# Patient Record
Sex: Male | Born: 1980 | Race: Black or African American | Hispanic: No | Marital: Single | Smoking: Former smoker
Health system: Southern US, Community
[De-identification: ages and names within clinical notes are randomized; demographics above are authoritative.]

## PROBLEM LIST (undated history)

## (undated) DIAGNOSIS — R4689 Other symptoms and signs involving appearance and behavior: Secondary | ICD-10-CM

## (undated) DIAGNOSIS — M109 Gout, unspecified: Secondary | ICD-10-CM

## (undated) DIAGNOSIS — Z87891 Personal history of nicotine dependence: Secondary | ICD-10-CM

## (undated) DIAGNOSIS — B009 Herpesviral infection, unspecified: Secondary | ICD-10-CM

## (undated) DIAGNOSIS — E119 Type 2 diabetes mellitus without complications: Secondary | ICD-10-CM

## (undated) DIAGNOSIS — I10 Essential (primary) hypertension: Secondary | ICD-10-CM

## (undated) HISTORY — DX: Herpesviral infection, unspecified: B00.9

---

## 2012-10-24 ENCOUNTER — Emergency Department: Payer: Self-pay | Admitting: Emergency Medicine

## 2013-02-21 ENCOUNTER — Emergency Department: Payer: Self-pay | Admitting: Emergency Medicine

## 2013-02-21 LAB — COMPREHENSIVE METABOLIC PANEL
Albumin: 3.9 g/dL (ref 3.4–5.0)
Anion Gap: 2 — ABNORMAL LOW (ref 7–16)
Bilirubin,Total: 0.3 mg/dL (ref 0.2–1.0)
Co2: 28 mmol/L (ref 21–32)
EGFR (Non-African Amer.): >60
Glucose: 135 mg/dL — ABNORMAL HIGH (ref 65–99)
Osmolality: 275 (ref 275–301)
SGPT (ALT): 61 U/L (ref 12–78)
Sodium: 137 mmol/L (ref 136–145)
Total Protein: 7.9 g/dL (ref 6.4–8.2)

## 2013-02-21 LAB — CBC WITH DIFFERENTIAL/PLATELET
Basophil #: 0 10*3/uL (ref 0.0–0.1)
Basophil %: 0.8 %
Eosinophil #: 0.1 10*3/uL (ref 0.0–0.7)
Eosinophil %: 1.1 %
HCT: 41.5 % (ref 40.0–52.0)
Lymphocyte #: 1.5 10*3/uL (ref 1.0–3.6)
Lymphocyte %: 30.6 %
MCH: 29.2 pg (ref 26.0–34.0)
MCV: 89 fL (ref 80–100)
Monocyte #: 0.9 x10 3/mm (ref 0.2–1.0)
Neutrophil #: 2.4 10*3/uL (ref 1.4–6.5)
Neutrophil %: 50 %
RBC: 4.65 10*6/uL (ref 4.40–5.90)
RDW: 12.5 % (ref 11.5–14.5)
WBC: 4.9 10*3/uL (ref 3.8–10.6)

## 2013-11-19 ENCOUNTER — Emergency Department: Payer: Self-pay | Admitting: Emergency Medicine

## 2013-11-22 LAB — BETA STREP CULTURE(ARMC)

## 2013-12-28 ENCOUNTER — Ambulatory Visit: Payer: Self-pay | Admitting: Family Medicine

## 2015-11-29 ENCOUNTER — Emergency Department
Admission: EM | Admit: 2015-11-29 | Discharge: 2015-11-29 | Disposition: A | Discharge status: Home / Self Care | Payer: Self-pay | Attending: Emergency Medicine | Admitting: Emergency Medicine

## 2015-11-29 DIAGNOSIS — A6002 Herpesviral infection of other male genital organs: Secondary | ICD-10-CM | POA: Insufficient documentation

## 2015-11-29 DIAGNOSIS — A6000 Herpesviral infection of urogenital system, unspecified: Secondary | ICD-10-CM

## 2015-11-29 NOTE — ED Notes (Signed)
Pt has small area where skin is torn on penis. No bleeding. Pt states area itches and burns. Pt denies applying anything to the area.

## 2015-11-29 NOTE — ED Provider Notes (Signed)
[facility] Emergency Department Provider Note    First MD Initiated Contact with Patient 11/29/15 [phone]     (approximate)  I have reviewed the triage vital signs and the nursing notes.   HISTORY  Chief Complaint Wound Infection   HPI Mark Devra Doppotten Jr. is a 35 y.o. male presents with a "infection to the tip of his penis for which she was seen at the health Department last week and was told that it might be herpes. Patient states blood test was sent to check for herpes however he has not received any correspondence mouth Department. Patient now presents emergency Department concerned about the etiology of the infection. Patient states that the area of concern started as multiple grouped blisters that then ruptured with a resultant painful sore. Patient does admit to unprotected sex. His partner bedside stated that they were both recently treated for chlamydia.   Past medical history None There are no active problems to display for this patient.  Past surgical history None  Prior to Admission medications   Not on File    Allergies No known drug allergies No family history on file.  Social History Social History  Substance Use Topics  . Smoking status: Not on file  . Smokeless tobacco: Not on file  . Alcohol use Not on file    Review of Systems Constitutional: No fever/chills Eyes: No visual changes. ENT: No sore throat. Cardiovascular: Denies chest pain. Respiratory: Denies shortness of breath. Gastrointestinal: No abdominal pain.  No nausea, no vomiting.  No diarrhea.  No constipation. Genitourinary: Negative for dysuria.Positive for sore on penis Musculoskeletal: Negative for back pain. Skin: Negative for rash. Neurological: Negative for headaches, focal weakness or numbness.  10-point ROS otherwise negative.  ____________________________________________   PHYSICAL EXAM:  VITAL SIGNS: ED Triage Vitals  Enc Vitals Group     BP  11/29/15 0119 (!) 214/107     Pulse Rate 11/29/15 0119 81     Resp 11/29/15 0119 18     Temp 11/29/15 0119 98.3 F (36.8 C)     Temp Source 11/29/15 0119 Oral     SpO2 11/29/15 0119 99 %     Weight 11/29/15 0119 220 lb (99.8 kg)     Height 11/29/15 0119 5\' 6"  (1.676 m)     Head Circumference --      Peak Flow --      Pain Score 11/29/15 0120 3     Pain Loc --      Pain Edu? --      Excl. in GC? --     Constitutional: Alert and oriented. Well appearing and in no acute distress. Eyes: Conjunctivae are normal. PERRL. EOMI. Head: Atraumatic.a. Mouth/Throat: Mucous membranes are moist.  Oropharynx non-erythematous. Neck: No stridor.  No meningeal signs.  Cardiovascular: Normal rate, regular rhythm. Good peripheral circulation. Grossly normal heart sounds. Respiratory: Normal respiratory effort.  No retractions. Lungs CTAB. Gastrointestinal: Soft and nontender. No distention.  Genitourinary: Shallow-based ulcer with rolled edges noted base of the glans penis Musculoskeletal: No lower extremity tenderness nor edema. No gross deformities of extremities. Neurologic:  Normal speech and language. No gross focal neurologic deficits are appreciated.  Skin:  Skin is warm, dry and intact. No rash noted.    Procedures     INITIAL IMPRESSION / ASSESSMENT AND PLAN / ED COURSE  Pertinent labs & imaging results that were available during my care of the patient were reviewed by me and considered in my medical decision  making (see chart for details).  History of physical exam concern for possible genital herpes. I informed the patient to contact the health Department to get the results of the blood test that was performed.   Clinical Course    ____________________________________________  FINAL CLINICAL IMPRESSION(S) / ED DIAGNOSES  Final diagnoses:  Genital herpes     MEDICATIONS GIVEN DURING THIS VISIT:  Medications - No data to display   NEW OUTPATIENT MEDICATIONS STARTED  DURING THIS VISIT:  New Prescriptions   No medications on file    Modified Medications   No medications on file    Discontinued Medications   No medications on file     Note:  This document was prepared using Dragon voice recognition software and may include unintentional dictation errors.    Darci Current, MD 11/29/15 2239

## 2015-11-29 NOTE — ED Triage Notes (Signed)
Pt states he has infection to tip of penis went to health dept last week and checked for herpes but did not get results.

## 2017-01-03 ENCOUNTER — Encounter (HOSPITAL_COMMUNITY): Admission: EM | Disposition: A | Discharge status: Rehabilitation Facility | Payer: Self-pay | Source: Home / Self Care

## 2017-01-03 ENCOUNTER — Inpatient Hospital Stay (HOSPITAL_COMMUNITY): Payer: Self-pay

## 2017-01-03 ENCOUNTER — Emergency Department (HOSPITAL_COMMUNITY): Payer: Self-pay

## 2017-01-03 ENCOUNTER — Inpatient Hospital Stay (HOSPITAL_COMMUNITY)
Admission: EM | Admit: 2017-01-03 | Discharge: 2017-01-29 | DRG: 957 | Disposition: A | Discharge status: Rehabilitation Facility | Payer: Self-pay | Attending: General Surgery | Admitting: General Surgery

## 2017-01-03 ENCOUNTER — Emergency Department (HOSPITAL_COMMUNITY): Payer: Self-pay | Admitting: Certified Registered"

## 2017-01-03 DIAGNOSIS — Z9119 Patient's noncompliance with other medical treatment and regimen: Secondary | ICD-10-CM

## 2017-01-03 DIAGNOSIS — S0633AA Contusion and laceration of cerebrum, unspecified, with loss of consciousness status unknown, initial encounter: Secondary | ICD-10-CM

## 2017-01-03 DIAGNOSIS — Y92410 Unspecified street and highway as the place of occurrence of the external cause: Secondary | ICD-10-CM

## 2017-01-03 DIAGNOSIS — J9811 Atelectasis: Secondary | ICD-10-CM | POA: Diagnosis present

## 2017-01-03 DIAGNOSIS — S12100A Unspecified displaced fracture of second cervical vertebra, initial encounter for closed fracture: Secondary | ICD-10-CM

## 2017-01-03 DIAGNOSIS — R509 Fever, unspecified: Secondary | ICD-10-CM

## 2017-01-03 DIAGNOSIS — R0902 Hypoxemia: Secondary | ICD-10-CM

## 2017-01-03 DIAGNOSIS — Z23 Encounter for immunization: Secondary | ICD-10-CM

## 2017-01-03 DIAGNOSIS — S52571A Other intraarticular fracture of lower end of right radius, initial encounter for closed fracture: Secondary | ICD-10-CM | POA: Diagnosis present

## 2017-01-03 DIAGNOSIS — S12101A Unspecified nondisplaced fracture of second cervical vertebra, initial encounter for closed fracture: Secondary | ICD-10-CM | POA: Diagnosis present

## 2017-01-03 DIAGNOSIS — J9690 Respiratory failure, unspecified, unspecified whether with hypoxia or hypercapnia: Secondary | ICD-10-CM

## 2017-01-03 DIAGNOSIS — E1165 Type 2 diabetes mellitus with hyperglycemia: Secondary | ICD-10-CM | POA: Diagnosis present

## 2017-01-03 DIAGNOSIS — E870 Hyperosmolality and hypernatremia: Secondary | ICD-10-CM | POA: Diagnosis not present

## 2017-01-03 DIAGNOSIS — S82261C Displaced segmental fracture of shaft of right tibia, initial encounter for open fracture type IIIA, IIIB, or IIIC: Secondary | ICD-10-CM

## 2017-01-03 DIAGNOSIS — E872 Acidosis: Secondary | ICD-10-CM | POA: Diagnosis present

## 2017-01-03 DIAGNOSIS — R402112 Coma scale, eyes open, never, at arrival to emergency department: Secondary | ICD-10-CM | POA: Diagnosis present

## 2017-01-03 DIAGNOSIS — D649 Anemia, unspecified: Secondary | ICD-10-CM

## 2017-01-03 DIAGNOSIS — S82891C Other fracture of right lower leg, initial encounter for open fracture type IIIA, IIIB, or IIIC: Secondary | ICD-10-CM

## 2017-01-03 DIAGNOSIS — R402312 Coma scale, best motor response, none, at arrival to emergency department: Secondary | ICD-10-CM | POA: Diagnosis present

## 2017-01-03 DIAGNOSIS — S06369A Traumatic hemorrhage of cerebrum, unspecified, with loss of consciousness of unspecified duration, initial encounter: Secondary | ICD-10-CM

## 2017-01-03 DIAGNOSIS — T148XXA Other injury of unspecified body region, initial encounter: Secondary | ICD-10-CM

## 2017-01-03 DIAGNOSIS — J9601 Acute respiratory failure with hypoxia: Secondary | ICD-10-CM

## 2017-01-03 DIAGNOSIS — L8990 Pressure ulcer of unspecified site, unspecified stage: Secondary | ICD-10-CM

## 2017-01-03 DIAGNOSIS — T07XXXA Unspecified multiple injuries, initial encounter: Secondary | ICD-10-CM

## 2017-01-03 DIAGNOSIS — Z0189 Encounter for other specified special examinations: Secondary | ICD-10-CM

## 2017-01-03 DIAGNOSIS — S06359A Traumatic hemorrhage of left cerebrum with loss of consciousness of unspecified duration, initial encounter: Secondary | ICD-10-CM | POA: Diagnosis present

## 2017-01-03 DIAGNOSIS — T1490XA Injury, unspecified, initial encounter: Secondary | ICD-10-CM

## 2017-01-03 DIAGNOSIS — R339 Retention of urine, unspecified: Secondary | ICD-10-CM | POA: Diagnosis present

## 2017-01-03 DIAGNOSIS — R0682 Tachypnea, not elsewhere classified: Secondary | ICD-10-CM

## 2017-01-03 DIAGNOSIS — K117 Disturbances of salivary secretion: Secondary | ICD-10-CM

## 2017-01-03 DIAGNOSIS — R739 Hyperglycemia, unspecified: Secondary | ICD-10-CM

## 2017-01-03 DIAGNOSIS — Z9114 Patient's other noncompliance with medication regimen: Secondary | ICD-10-CM

## 2017-01-03 DIAGNOSIS — S0181XA Laceration without foreign body of other part of head, initial encounter: Secondary | ICD-10-CM | POA: Diagnosis present

## 2017-01-03 DIAGNOSIS — S52611A Displaced fracture of right ulna styloid process, initial encounter for closed fracture: Secondary | ICD-10-CM | POA: Diagnosis present

## 2017-01-03 DIAGNOSIS — Z789 Other specified health status: Secondary | ICD-10-CM

## 2017-01-03 DIAGNOSIS — S80212A Abrasion, left knee, initial encounter: Secondary | ICD-10-CM | POA: Diagnosis present

## 2017-01-03 DIAGNOSIS — S52601A Unspecified fracture of lower end of right ulna, initial encounter for closed fracture: Secondary | ICD-10-CM

## 2017-01-03 DIAGNOSIS — S52501A Unspecified fracture of the lower end of right radius, initial encounter for closed fracture: Secondary | ICD-10-CM

## 2017-01-03 DIAGNOSIS — S93439A Sprain of tibiofibular ligament of unspecified ankle, initial encounter: Secondary | ICD-10-CM | POA: Diagnosis present

## 2017-01-03 DIAGNOSIS — E162 Hypoglycemia, unspecified: Secondary | ICD-10-CM

## 2017-01-03 DIAGNOSIS — S82841C Displaced bimalleolar fracture of right lower leg, initial encounter for open fracture type IIIA, IIIB, or IIIC: Secondary | ICD-10-CM | POA: Diagnosis present

## 2017-01-03 DIAGNOSIS — E876 Hypokalemia: Secondary | ICD-10-CM | POA: Diagnosis not present

## 2017-01-03 DIAGNOSIS — I10 Essential (primary) hypertension: Secondary | ICD-10-CM | POA: Diagnosis present

## 2017-01-03 DIAGNOSIS — D62 Acute posthemorrhagic anemia: Secondary | ICD-10-CM | POA: Diagnosis present

## 2017-01-03 DIAGNOSIS — R000 Tachycardia, unspecified: Secondary | ICD-10-CM

## 2017-01-03 DIAGNOSIS — D72829 Elevated white blood cell count, unspecified: Secondary | ICD-10-CM

## 2017-01-03 DIAGNOSIS — Z87891 Personal history of nicotine dependence: Secondary | ICD-10-CM

## 2017-01-03 DIAGNOSIS — E874 Mixed disorder of acid-base balance: Secondary | ICD-10-CM | POA: Diagnosis present

## 2017-01-03 DIAGNOSIS — K567 Ileus, unspecified: Secondary | ICD-10-CM | POA: Diagnosis not present

## 2017-01-03 DIAGNOSIS — E119 Type 2 diabetes mellitus without complications: Secondary | ICD-10-CM

## 2017-01-03 DIAGNOSIS — Z419 Encounter for procedure for purposes other than remedying health state, unspecified: Secondary | ICD-10-CM

## 2017-01-03 DIAGNOSIS — J989 Respiratory disorder, unspecified: Secondary | ICD-10-CM

## 2017-01-03 DIAGNOSIS — R402222 Coma scale, best verbal response, incomprehensible words, at arrival to emergency department: Secondary | ICD-10-CM | POA: Diagnosis present

## 2017-01-03 DIAGNOSIS — J80 Acute respiratory distress syndrome: Secondary | ICD-10-CM | POA: Diagnosis present

## 2017-01-03 DIAGNOSIS — S069X9A Unspecified intracranial injury with loss of consciousness of unspecified duration, initial encounter: Secondary | ICD-10-CM

## 2017-01-03 DIAGNOSIS — R1310 Dysphagia, unspecified: Secondary | ICD-10-CM

## 2017-01-03 DIAGNOSIS — Z978 Presence of other specified devices: Secondary | ICD-10-CM

## 2017-01-03 DIAGNOSIS — S299XXA Unspecified injury of thorax, initial encounter: Secondary | ICD-10-CM

## 2017-01-03 DIAGNOSIS — E785 Hyperlipidemia, unspecified: Secondary | ICD-10-CM | POA: Diagnosis present

## 2017-01-03 DIAGNOSIS — J9600 Acute respiratory failure, unspecified whether with hypoxia or hypercapnia: Secondary | ICD-10-CM

## 2017-01-03 HISTORY — DX: Unspecified fracture of the lower end of right radius, initial encounter for closed fracture: S52.501A

## 2017-01-03 HISTORY — DX: Gout, unspecified: M10.9

## 2017-01-03 HISTORY — DX: Type 2 diabetes mellitus without complications: E11.9

## 2017-01-03 HISTORY — PX: EXTERNAL FIXATION ARM: SHX1552

## 2017-01-03 HISTORY — DX: Other fracture of right lower leg, initial encounter for open fracture type IIIA, IIIB, or IIIC: S82.891C

## 2017-01-03 HISTORY — DX: Displaced segmental fracture of shaft of right tibia, initial encounter for open fracture type IIIA, IIIB, or IIIC: S82.261C

## 2017-01-03 HISTORY — DX: Unspecified displaced fracture of second cervical vertebra, initial encounter for closed fracture: S12.100A

## 2017-01-03 HISTORY — DX: Contusion and laceration of cerebrum, unspecified, with loss of consciousness status unknown, initial encounter: S06.33AA

## 2017-01-03 HISTORY — DX: Essential (primary) hypertension: I10

## 2017-01-03 HISTORY — PX: I & D EXTREMITY: SHX5045

## 2017-01-03 HISTORY — PX: EXTERNAL FIXATION LEG: SHX1549

## 2017-01-03 HISTORY — DX: Personal history of nicotine dependence: Z87.891

## 2017-01-03 HISTORY — DX: Other symptoms and signs involving appearance and behavior: R46.89

## 2017-01-03 LAB — CBC WITH DIFFERENTIAL/PLATELET
BASOS ABS: 0 10*3/uL (ref 0.0–0.1)
BASOS PCT: 0 %
EOS ABS: 0.1 10*3/uL (ref 0.0–0.7)
Eosinophils Relative: 1 %
HCT: 38.8 % — ABNORMAL LOW (ref 39.0–52.0)
Hemoglobin: 12.7 g/dL — ABNORMAL LOW (ref 13.0–17.0)
LYMPHS ABS: 3.6 10*3/uL (ref 0.7–4.0)
Lymphocytes Relative: 29 %
MCH: 29.6 pg (ref 26.0–34.0)
MCHC: 32.7 g/dL (ref 30.0–36.0)
MCV: 90.4 fL (ref 78.0–100.0)
Monocytes Absolute: 0.6 10*3/uL (ref 0.1–1.0)
Monocytes Relative: 5 %
NEUTROS ABS: 8.2 10*3/uL — AB (ref 1.7–7.7)
Neutrophils Relative %: 65 %
Platelets: 248 10*3/uL (ref 150–400)
RBC: 4.29 MIL/uL (ref 4.22–5.81)
RDW: 13 % (ref 11.5–15.5)
WBC: 12.5 10*3/uL — ABNORMAL HIGH (ref 4.0–10.5)

## 2017-01-03 LAB — COMPREHENSIVE METABOLIC PANEL
ALBUMIN: 4.1 g/dL (ref 3.5–5.0)
ALT: 38 U/L (ref 17–63)
ANION GAP: 8 (ref 5–15)
AST: 57 U/L — ABNORMAL HIGH (ref 15–41)
Alkaline Phosphatase: 100 U/L (ref 38–126)
BILIRUBIN TOTAL: 0.8 mg/dL (ref 0.3–1.2)
BUN: 11 mg/dL (ref 6–20)
CALCIUM: 8.2 mg/dL — AB (ref 8.9–10.3)
CO2: 23 mmol/L (ref 22–32)
Chloride: 101 mmol/L (ref 101–111)
Creatinine, Ser: 0.96 mg/dL (ref 0.61–1.24)
GFR calc non Af Amer: >60 mL/min (ref >60)
GLUCOSE: 479 mg/dL — AB (ref 65–99)
POTASSIUM: 3.7 mmol/L (ref 3.5–5.1)
SODIUM: 132 mmol/L — AB (ref 135–145)
TOTAL PROTEIN: 6.7 g/dL (ref 6.5–8.1)

## 2017-01-03 LAB — PREPARE FRESH FROZEN PLASMA
UNIT DIVISION: 0
Unit division: 0

## 2017-01-03 LAB — I-STAT ARTERIAL BLOOD GAS, ED
Acid-base deficit: 1 mmol/L (ref 0.0–2.0)
Bicarbonate: 25.8 mmol/L (ref 20.0–28.0)
O2 SAT: 100 %
PCO2 ART: 52.1 mmHg — AB (ref 32.0–48.0)
PH ART: 7.311 — AB (ref 7.350–7.450)
TCO2: 27 mmol/L (ref 22–32)
pO2, Arterial: 413 mmHg — ABNORMAL HIGH (ref 83.0–108.0)

## 2017-01-03 LAB — URINALYSIS, ROUTINE W REFLEX MICROSCOPIC
Bilirubin Urine: NEGATIVE
Ketones, ur: NEGATIVE mg/dL
LEUKOCYTES UA: NEGATIVE
Nitrite: NEGATIVE
PH: 6 (ref 5.0–8.0)
Protein, ur: 100 mg/dL — AB
SPECIFIC GRAVITY, URINE: 1.014 (ref 1.005–1.030)
SQUAMOUS EPITHELIAL / LPF: NONE SEEN

## 2017-01-03 LAB — CBG MONITORING, ED
GLUCOSE-CAPILLARY: 453 mg/dL — AB (ref 65–99)
Glucose-Capillary: 280 mg/dL — ABNORMAL HIGH (ref 65–99)

## 2017-01-03 LAB — PROTIME-INR
INR: 0.92
Prothrombin Time: 12.2 seconds (ref 11.4–15.2)

## 2017-01-03 LAB — GLUCOSE, CAPILLARY
GLUCOSE-CAPILLARY: 198 mg/dL — AB (ref 65–99)
GLUCOSE-CAPILLARY: 179 mg/dL — AB (ref 65–99)
Glucose-Capillary: 225 mg/dL — ABNORMAL HIGH (ref 65–99)

## 2017-01-03 LAB — BPAM FFP
BLOOD PRODUCT EXPIRATION DATE: 201811042359
Blood Product Expiration Date: 201811082359
ISSUE DATE / TIME: 201811020851
ISSUE DATE / TIME: 201811020851
UNIT TYPE AND RH: 6200
Unit Type and Rh: 600

## 2017-01-03 LAB — MRSA PCR SCREENING: MRSA by PCR: NEGATIVE

## 2017-01-03 LAB — ABO/RH: ABO/RH(D): B POS

## 2017-01-03 LAB — I-STAT CG4 LACTIC ACID, ED: LACTIC ACID, VENOUS: 2.79 mmol/L — AB (ref 0.5–1.9)

## 2017-01-03 LAB — TRIGLYCERIDES: TRIGLYCERIDES: 229 mg/dL — AB (ref <150)

## 2017-01-03 SURGERY — IRRIGATION AND DEBRIDEMENT EXTREMITY
Anesthesia: General | Site: Wrist | Laterality: Right

## 2017-01-03 MED ORDER — LIDOCAINE HCL (PF) 1 % IJ SOLN
INTRAMUSCULAR | Status: AC
  Filled 2017-01-03: qty 5

## 2017-01-03 MED ORDER — SODIUM CHLORIDE 0.9 % IV SOLN
INTRAVENOUS | Status: AC | PRN
  Administered 2017-01-03: 1000 mL via INTRAVENOUS
  Administered 2017-01-03: 100 mL/h via INTRAVENOUS

## 2017-01-03 MED ORDER — TETANUS-DIPHTH-ACELL PERTUSSIS 5-2.5-18.5 LF-MCG/0.5 IM SUSP
0.5000 mL | Freq: Once | INTRAMUSCULAR | Status: AC
  Administered 2017-01-03: 0.5 mL via INTRAMUSCULAR
  Filled 2017-01-03: qty 0.5

## 2017-01-03 MED ORDER — PROPOFOL 10 MG/ML IV BOLUS
INTRAVENOUS | Status: AC
  Filled 2017-01-03: qty 20

## 2017-01-03 MED ORDER — FENTANYL 2500MCG IN NS 250ML (10MCG/ML) PREMIX INFUSION
25.0000 ug/h | INTRAVENOUS | Status: DC
  Administered 2017-01-03: 50 ug/h via INTRAVENOUS
  Administered 2017-01-04: 125 ug/h via INTRAVENOUS
  Administered 2017-01-05 (×2): 150 ug/h via INTRAVENOUS
  Administered 2017-01-06: 250 ug/h via INTRAVENOUS
  Administered 2017-01-06: 400 ug/h via INTRAVENOUS
  Administered 2017-01-06 (×2): 250 ug/h via INTRAVENOUS
  Administered 2017-01-07 (×2): 300 ug/h via INTRAVENOUS
  Administered 2017-01-07: 250 ug/h via INTRAVENOUS
  Administered 2017-01-08: 300 ug/h via INTRAVENOUS
  Filled 2017-01-03 (×11): qty 250

## 2017-01-03 MED ORDER — IOPAMIDOL (ISOVUE-370) INJECTION 76%
INTRAVENOUS | Status: AC
  Administered 2017-01-03: 50 mL
  Filled 2017-01-03: qty 50

## 2017-01-03 MED ORDER — SUCCINYLCHOLINE CHLORIDE 20 MG/ML IJ SOLN
INTRAMUSCULAR | Status: AC | PRN
  Administered 2017-01-03: 150 mg via INTRAVENOUS

## 2017-01-03 MED ORDER — ORAL CARE MOUTH RINSE
15.0000 mL | Freq: Four times a day (QID) | OROMUCOSAL | Status: DC
  Administered 2017-01-03: 15 mL via OROMUCOSAL

## 2017-01-03 MED ORDER — CEFAZOLIN SODIUM-DEXTROSE 2-4 GM/100ML-% IV SOLN
2.0000 g | Freq: Once | INTRAVENOUS | Status: AC
  Administered 2017-01-03: 2 g via INTRAVENOUS
  Filled 2017-01-03: qty 100

## 2017-01-03 MED ORDER — METOPROLOL TARTRATE 5 MG/5ML IV SOLN
INTRAVENOUS | Status: AC
  Filled 2017-01-03: qty 5

## 2017-01-03 MED ORDER — LABETALOL HCL 5 MG/ML IV SOLN
INTRAVENOUS | Status: AC
  Filled 2017-01-03: qty 4

## 2017-01-03 MED ORDER — ONDANSETRON 4 MG PO TBDP: 4.0000 mg | ORAL_TABLET | Freq: Four times a day (QID) | ORAL | Status: DC | PRN

## 2017-01-03 MED ORDER — METOPROLOL TARTRATE 5 MG/5ML IV SOLN
INTRAVENOUS | Status: DC | PRN
  Administered 2017-01-03 (×2): 2.5 mg via INTRAVENOUS

## 2017-01-03 MED ORDER — VANCOMYCIN HCL 1000 MG IV SOLR
INTRAVENOUS | Status: DC | PRN
  Administered 2017-01-03: 1000 mg

## 2017-01-03 MED ORDER — ETOMIDATE 2 MG/ML IV SOLN
INTRAVENOUS | Status: AC | PRN
  Administered 2017-01-03: 20 mg via INTRAVENOUS

## 2017-01-03 MED ORDER — PROPOFOL 1000 MG/100ML IV EMUL
0.0000 ug/kg/min | INTRAVENOUS | Status: DC
  Administered 2017-01-03 (×2): 50 ug/kg/min via INTRAVENOUS
  Administered 2017-01-03 – 2017-01-05 (×10): 40 ug/kg/min via INTRAVENOUS
  Administered 2017-01-06 (×2): 50 ug/kg/min via INTRAVENOUS
  Administered 2017-01-06: 40 ug/kg/min via INTRAVENOUS
  Administered 2017-01-06: 50 ug/kg/min via INTRAVENOUS
  Filled 2017-01-03 (×17): qty 100

## 2017-01-03 MED ORDER — SODIUM CHLORIDE 0.9 % IV SOLN
500.0000 mg | Freq: Two times a day (BID) | INTRAVENOUS | Status: AC
  Administered 2017-01-03 – 2017-01-10 (×14): 500 mg via INTRAVENOUS
  Filled 2017-01-03 (×14): qty 5

## 2017-01-03 MED ORDER — ESMOLOL HCL 100 MG/10ML IV SOLN
INTRAVENOUS | Status: DC | PRN
  Administered 2017-01-03 (×2): 30 mg via INTRAVENOUS
  Administered 2017-01-03: 20 mg via INTRAVENOUS

## 2017-01-03 MED ORDER — CHLORHEXIDINE GLUCONATE 0.12% ORAL RINSE (MEDLINE KIT)
15.0000 mL | Freq: Two times a day (BID) | OROMUCOSAL | Status: DC
  Administered 2017-01-04 – 2017-01-08 (×9): 15 mL via OROMUCOSAL

## 2017-01-03 MED ORDER — MIDAZOLAM HCL 2 MG/2ML IJ SOLN
INTRAMUSCULAR | Status: AC
  Filled 2017-01-03: qty 2

## 2017-01-03 MED ORDER — FENTANYL CITRATE (PF) 100 MCG/2ML IJ SOLN: 100.0000 ug | INTRAMUSCULAR | Status: DC | PRN

## 2017-01-03 MED ORDER — ROCURONIUM BROMIDE 10 MG/ML (PF) SYRINGE
PREFILLED_SYRINGE | INTRAVENOUS | Status: DC | PRN
  Administered 2017-01-03: 100 mg via INTRAVENOUS
  Administered 2017-01-03: 60 mg via INTRAVENOUS

## 2017-01-03 MED ORDER — ORAL CARE MOUTH RINSE
15.0000 mL | OROMUCOSAL | Status: DC
  Administered 2017-01-04 – 2017-01-08 (×42): 15 mL via OROMUCOSAL

## 2017-01-03 MED ORDER — PHENYLEPHRINE 40 MCG/ML (10ML) SYRINGE FOR IV PUSH (FOR BLOOD PRESSURE SUPPORT)
PREFILLED_SYRINGE | INTRAVENOUS | Status: DC | PRN
  Administered 2017-01-03 (×2): 80 ug via INTRAVENOUS

## 2017-01-03 MED ORDER — HYDRALAZINE HCL 20 MG/ML IJ SOLN
10.0000 mg | INTRAMUSCULAR | Status: AC | PRN
  Administered 2017-01-04 – 2017-01-14 (×4): 10 mg via INTRAVENOUS
  Filled 2017-01-03 (×5): qty 1

## 2017-01-03 MED ORDER — PANTOPRAZOLE SODIUM 40 MG PO TBEC
40.0000 mg | DELAYED_RELEASE_TABLET | Freq: Every day | ORAL | Status: DC
  Administered 2017-01-09 – 2017-01-29 (×8): 40 mg via ORAL
  Filled 2017-01-03 (×8): qty 1

## 2017-01-03 MED ORDER — ACETAMINOPHEN 160 MG/5ML PO SOLN
650.0000 mg | Freq: Four times a day (QID) | ORAL | Status: DC | PRN
  Administered 2017-01-03 – 2017-01-15 (×18): 650 mg
  Filled 2017-01-03 (×19): qty 20.3

## 2017-01-03 MED ORDER — FENTANYL CITRATE (PF) 100 MCG/2ML IJ SOLN
INTRAMUSCULAR | Status: AC
  Filled 2017-01-03: qty 2

## 2017-01-03 MED ORDER — PHENYLEPHRINE 40 MCG/ML (10ML) SYRINGE FOR IV PUSH (FOR BLOOD PRESSURE SUPPORT)
PREFILLED_SYRINGE | INTRAVENOUS | Status: AC
  Filled 2017-01-03: qty 10

## 2017-01-03 MED ORDER — FENTANYL CITRATE (PF) 250 MCG/5ML IJ SOLN
INTRAMUSCULAR | Status: AC
  Filled 2017-01-03: qty 5

## 2017-01-03 MED ORDER — PROPOFOL 10 MG/ML IV BOLUS
INTRAVENOUS | Status: DC | PRN
Start: 2017-01-03 — End: 2017-01-03
  Administered 2017-01-03: 30 mg via INTRAVENOUS
  Administered 2017-01-03: 70 mg via INTRAVENOUS

## 2017-01-03 MED ORDER — BACITRACIN-NEOMYCIN-POLYMYXIN OINTMENT TUBE
TOPICAL_OINTMENT | Freq: Every day | CUTANEOUS | Status: DC
  Administered 2017-01-04 – 2017-01-08 (×4): via TOPICAL
  Administered 2017-01-09 – 2017-01-10 (×2): 1 via TOPICAL
  Administered 2017-01-11 – 2017-01-13 (×3): via TOPICAL
  Administered 2017-01-14: 1 via TOPICAL
  Administered 2017-01-15 – 2017-01-17 (×2): via TOPICAL
  Filled 2017-01-03 (×2): qty 14.17

## 2017-01-03 MED ORDER — CHLORHEXIDINE GLUCONATE 0.12% ORAL RINSE (MEDLINE KIT)
15.0000 mL | Freq: Two times a day (BID) | OROMUCOSAL | Status: DC
  Administered 2017-01-03 (×2): 15 mL via OROMUCOSAL

## 2017-01-03 MED ORDER — PROPOFOL 1000 MG/100ML IV EMUL
INTRAVENOUS | Status: AC
  Filled 2017-01-03: qty 100

## 2017-01-03 MED ORDER — VANCOMYCIN HCL 1000 MG IV SOLR
INTRAVENOUS | Status: AC
  Filled 2017-01-03: qty 1000

## 2017-01-03 MED ORDER — 0.9 % SODIUM CHLORIDE (POUR BTL) OPTIME
TOPICAL | Status: DC | PRN
  Administered 2017-01-03: 1000 mL

## 2017-01-03 MED ORDER — FENTANYL CITRATE (PF) 100 MCG/2ML IJ SOLN
INTRAMUSCULAR | Status: AC | PRN
  Administered 2017-01-03: 100 ug via INTRAVENOUS

## 2017-01-03 MED ORDER — SODIUM CHLORIDE 0.9 % IV SOLN
INTRAVENOUS | Status: DC | PRN
  Administered 2017-01-03 (×2): via INTRAVENOUS

## 2017-01-03 MED ORDER — PROPOFOL 1000 MG/100ML IV EMUL
5.0000 ug/kg/min | Freq: Once | INTRAVENOUS | Status: AC
  Administered 2017-01-03: 5 ug/kg/min via INTRAVENOUS

## 2017-01-03 MED ORDER — ACETAMINOPHEN 10 MG/ML IV SOLN
INTRAVENOUS | Status: AC
  Filled 2017-01-03: qty 100

## 2017-01-03 MED ORDER — CEFAZOLIN SODIUM-DEXTROSE 2-3 GM-%(50ML) IV SOLR
INTRAVENOUS | Status: DC | PRN
  Administered 2017-01-03: 2 g via INTRAVENOUS

## 2017-01-03 MED ORDER — MIDAZOLAM HCL 2 MG/2ML IJ SOLN
INTRAMUSCULAR | Status: AC
  Filled 2017-01-03: qty 6

## 2017-01-03 MED ORDER — BISACODYL 10 MG RE SUPP
10.0000 mg | Freq: Every day | RECTAL | Status: DC | PRN
  Administered 2017-01-09 – 2017-01-27 (×5): 10 mg via RECTAL
  Filled 2017-01-03 (×5): qty 1

## 2017-01-03 MED ORDER — SODIUM CHLORIDE 0.45 % IV SOLN
INTRAVENOUS | Status: DC
  Administered 2017-01-03 – 2017-01-27 (×10): via INTRAVENOUS

## 2017-01-03 MED ORDER — PROPOFOL 500 MG/50ML IV EMUL
INTRAVENOUS | Status: DC | PRN
  Administered 2017-01-03: 75 ug/kg/min via INTRAVENOUS

## 2017-01-03 MED ORDER — FENTANYL CITRATE (PF) 100 MCG/2ML IJ SOLN
INTRAMUSCULAR | Status: DC | PRN
  Administered 2017-01-03 (×3): 100 ug via INTRAVENOUS

## 2017-01-03 MED ORDER — SODIUM CHLORIDE 0.9 % IR SOLN
Status: DC | PRN
  Administered 2017-01-03: 3000 mL

## 2017-01-03 MED ORDER — CEFAZOLIN SODIUM 1 G IJ SOLR
INTRAMUSCULAR | Status: AC
  Filled 2017-01-03: qty 20

## 2017-01-03 MED ORDER — ONDANSETRON HCL 4 MG/2ML IJ SOLN
4.0000 mg | Freq: Four times a day (QID) | INTRAMUSCULAR | Status: DC | PRN
  Administered 2017-01-08 – 2017-01-20 (×3): 4 mg via INTRAVENOUS
  Filled 2017-01-03 (×3): qty 2

## 2017-01-03 MED ORDER — PANTOPRAZOLE SODIUM 40 MG IV SOLR
40.0000 mg | Freq: Every day | INTRAVENOUS | Status: DC
  Administered 2017-01-04 – 2017-01-22 (×16): 40 mg via INTRAVENOUS
  Filled 2017-01-03 (×16): qty 40

## 2017-01-03 MED ORDER — ESMOLOL HCL 100 MG/10ML IV SOLN
INTRAVENOUS | Status: AC
  Filled 2017-01-03: qty 10

## 2017-01-03 MED ORDER — FENTANYL CITRATE (PF) 250 MCG/5ML IJ SOLN
INTRAMUSCULAR | Status: DC | PRN
  Administered 2017-01-03: 150 ug via INTRAVENOUS
  Administered 2017-01-03 (×2): 100 ug via INTRAVENOUS
  Administered 2017-01-03: 150 ug via INTRAVENOUS

## 2017-01-03 MED ORDER — LABETALOL HCL 5 MG/ML IV SOLN
INTRAVENOUS | Status: DC | PRN
  Administered 2017-01-03: 10 mg via INTRAVENOUS

## 2017-01-03 MED ORDER — ROCURONIUM BROMIDE 10 MG/ML (PF) SYRINGE
PREFILLED_SYRINGE | INTRAVENOUS | Status: AC
  Filled 2017-01-03: qty 5

## 2017-01-03 MED ORDER — CEFAZOLIN SODIUM-DEXTROSE 1-4 GM/50ML-% IV SOLN
1.0000 g | Freq: Three times a day (TID) | INTRAVENOUS | Status: AC
  Administered 2017-01-03 – 2017-01-05 (×5): 1 g via INTRAVENOUS
  Filled 2017-01-03 (×6): qty 50

## 2017-01-03 MED ORDER — LIDOCAINE HCL (PF) 1 % IJ SOLN
5.0000 mL | Freq: Once | INTRAMUSCULAR | Status: AC
  Administered 2017-01-03: 5 mL via INTRADERMAL

## 2017-01-03 MED ORDER — FENTANYL BOLUS VIA INFUSION
50.0000 ug | INTRAVENOUS | Status: DC | PRN
  Filled 2017-01-03: qty 50

## 2017-01-03 MED ORDER — FENTANYL CITRATE (PF) 100 MCG/2ML IJ SOLN: 50.0000 ug | Freq: Once | INTRAMUSCULAR | Status: DC

## 2017-01-03 MED ORDER — MIDAZOLAM HCL 5 MG/5ML IJ SOLN
INTRAMUSCULAR | Status: AC | PRN
  Administered 2017-01-03: 5 mg via INTRAVENOUS

## 2017-01-03 SURGICAL SUPPLY — 100 items
BANDAGE ACE 3X5.8 VEL STRL LF (GAUZE/BANDAGES/DRESSINGS) ×4 IMPLANT
BANDAGE ACE 4X5 VEL STRL LF (GAUZE/BANDAGES/DRESSINGS) IMPLANT
BANDAGE ACE 6X5 VEL STRL LF (GAUZE/BANDAGES/DRESSINGS) IMPLANT
BANDAGE ELASTIC 3 VELCRO ST LF (GAUZE/BANDAGES/DRESSINGS) ×8 IMPLANT
BANDAGE ELASTIC 4 VELCRO ST LF (GAUZE/BANDAGES/DRESSINGS) ×4 IMPLANT
BANDAGE ESMARK 6X9 LF (GAUZE/BANDAGES/DRESSINGS) ×3 IMPLANT
BIT DRILL 2.5X110 QC LCP DISP (BIT) ×8 IMPLANT
BIT DRILL QC 3.5X195 (BIT) ×4 IMPLANT
BLADE SURG 15 STRL LF DISP TIS (BLADE) ×6 IMPLANT
BLADE SURG 15 STRL SS (BLADE) ×2
BNDG COHESIVE 4X5 TAN STRL (GAUZE/BANDAGES/DRESSINGS) ×4 IMPLANT
BNDG COHESIVE 6X5 TAN STRL LF (GAUZE/BANDAGES/DRESSINGS) IMPLANT
BNDG ELASTIC 2 VLCR STRL LF (GAUZE/BANDAGES/DRESSINGS) ×4 IMPLANT
BNDG ESMARK 6X9 LF (GAUZE/BANDAGES/DRESSINGS) ×4
BNDG GAUZE ELAST 4 BULKY (GAUZE/BANDAGES/DRESSINGS) ×8 IMPLANT
BNDG GAUZE STRTCH 6 (GAUZE/BANDAGES/DRESSINGS) IMPLANT
BRUSH SCRUB SURG 4.25 DISP (MISCELLANEOUS) ×4 IMPLANT
CANISTER WOUND CARE 500ML ATS (WOUND CARE) ×4 IMPLANT
CHLORAPREP W/TINT 26ML (MISCELLANEOUS) ×4 IMPLANT
CLAMP ADJ DIST RAD FIX 4.0MM (Clamp) ×8 IMPLANT
CLAMP COMBI 8.0/11.0 LRG/MED (Clamp) ×8 IMPLANT
CLAMP COMBO MED CLIP-ON SELF (Clamp) ×16 IMPLANT
CLAMP LG MULTI PIN (Clamp) ×4 IMPLANT
CLAMP ROD ATTACHMENT (Clamp) ×8 IMPLANT
COVER SURGICAL LIGHT HANDLE (MISCELLANEOUS) ×8 IMPLANT
CUFF TOURNIQUET SINGLE 18IN (TOURNIQUET CUFF) IMPLANT
DRAPE C-ARM 42X72 X-RAY (DRAPES) ×4 IMPLANT
DRAPE C-ARMOR (DRAPES) ×4 IMPLANT
DRAPE EXTREMITY T 121X128X90 (DRAPE) ×4 IMPLANT
DRAPE ORTHO SPLIT 77X108 STRL (DRAPES)
DRAPE SURG ORHT 6 SPLT 77X108 (DRAPES) IMPLANT
DRAPE U-SHAPE 47X51 STRL (DRAPES) ×4 IMPLANT
DRSG ADAPTIC 3X8 NADH LF (GAUZE/BANDAGES/DRESSINGS) IMPLANT
DRSG MEPITEL 4X7.2 (GAUZE/BANDAGES/DRESSINGS) IMPLANT
DRSG PAD ABDOMINAL 8X10 ST (GAUZE/BANDAGES/DRESSINGS) ×4 IMPLANT
ELECT REM PT RETURN 9FT ADLT (ELECTROSURGICAL) ×4
ELECTRODE REM PT RTRN 9FT ADLT (ELECTROSURGICAL) ×3 IMPLANT
EVACUATOR 1/8 PVC DRAIN (DRAIN) IMPLANT
GAUZE SPONGE 4X4 12PLY STRL (GAUZE/BANDAGES/DRESSINGS) ×4 IMPLANT
GLOVE BIO SURGEON STRL SZ7.5 (GLOVE) ×20 IMPLANT
GLOVE BIOGEL PI IND STRL 6.5 (GLOVE) ×3 IMPLANT
GLOVE BIOGEL PI IND STRL 7.5 (GLOVE) ×3 IMPLANT
GLOVE BIOGEL PI IND STRL 8 (GLOVE) ×3 IMPLANT
GLOVE BIOGEL PI INDICATOR 6.5 (GLOVE) ×1
GLOVE BIOGEL PI INDICATOR 7.5 (GLOVE) ×1
GLOVE BIOGEL PI INDICATOR 8 (GLOVE) ×1
GLOVE SURG ORTHO 8.0 STRL STRW (GLOVE) ×4 IMPLANT
GLOVE SURG SS PI 6.5 STRL IVOR (GLOVE) ×8 IMPLANT
GOWN STRL REUS W/ TWL LRG LVL3 (GOWN DISPOSABLE) ×24 IMPLANT
GOWN STRL REUS W/TWL LRG LVL3 (GOWN DISPOSABLE) ×8
HANDPIECE INTERPULSE COAX TIP (DISPOSABLE) ×1
KIT BASIN OR (CUSTOM PROCEDURE TRAY) ×4 IMPLANT
KIT PREVENA INCISION MGT 13 (CANNISTER) ×4 IMPLANT
KIT ROOM TURNOVER OR (KITS) ×4 IMPLANT
MANIFOLD NEPTUNE II (INSTRUMENTS) ×4 IMPLANT
NEEDLE 22X1 1/2 (OR ONLY) (NEEDLE) IMPLANT
NS IRRIG 1000ML POUR BTL (IV SOLUTION) ×4 IMPLANT
PACK ORTHO EXTREMITY (CUSTOM PROCEDURE TRAY) ×4 IMPLANT
PACK UNIVERSAL I (CUSTOM PROCEDURE TRAY) IMPLANT
PAD ARMBOARD 7.5X6 YLW CONV (MISCELLANEOUS) ×8 IMPLANT
PAD CAST 3X4 CTTN HI CHSV (CAST SUPPLIES) ×3 IMPLANT
PAD CAST 4YDX4 CTTN HI CHSV (CAST SUPPLIES) ×3 IMPLANT
PADDING CAST COTTON 3X4 STRL (CAST SUPPLIES) ×1
PADDING CAST COTTON 4X4 STRL (CAST SUPPLIES) ×1
PADDING CAST COTTON 6X4 STRL (CAST SUPPLIES) IMPLANT
PADDING CAST SYNTHETIC 2 (CAST SUPPLIES) ×1
PADDING CAST SYNTHETIC 2X4 NS (CAST SUPPLIES) ×3 IMPLANT
PENCIL BUTTON HOLSTER BLD 10FT (ELECTRODE) ×4 IMPLANT
PIN SHANZ TRANSFIX 6.0X225 (PIN) ×4 IMPLANT
ROD CARBON FIBER 450MM (Rod) ×4 IMPLANT
ROD CARBON FIBER 500MM (Rod) ×4 IMPLANT
ROD CARBON FIBER 8.0MM 240MM (Rod) ×4 IMPLANT
ROD CARBON FIBER 8.0X160MM (Rod) ×4 IMPLANT
ROD CRBN FBR 8.0X120 (Rod) ×4 IMPLANT
SCREW SCHNZ SD 5.0 60 THRD/150 (Screw) ×3 IMPLANT
SCREW SCHNZ SD 5.0 80 THRD/200 (Screw) ×3 IMPLANT
SCRW SCHANZ SD 5.0 60 THRD/150 (Screw) ×4 IMPLANT
SCRW SCHANZ SD 5.0 80 THRD/200 (Screw) ×4 IMPLANT
SET HNDPC FAN SPRY TIP SCT (DISPOSABLE) ×3 IMPLANT
SLING ARM FOAM STRAP LRG (SOFTGOODS) ×4 IMPLANT
SPONGE LAP 18X18 X RAY DECT (DISPOSABLE) ×4 IMPLANT
STAPLER VISISTAT 35W (STAPLE) ×4 IMPLANT
STOCKINETTE 6  STRL (DRAPES) ×1
STOCKINETTE 6 STRL (DRAPES) ×3 IMPLANT
STOCKINETTE IMPERVIOUS 9X36 MD (GAUZE/BANDAGES/DRESSINGS) ×4 IMPLANT
STOCKINETTE IMPERVIOUS LG (DRAPES) ×4 IMPLANT
STRIP CLOSURE SKIN 1/2X4 (GAUZE/BANDAGES/DRESSINGS) IMPLANT
SUT ETHILON 3 0 PS 1 (SUTURE) ×8 IMPLANT
SUT MNCRL AB 3-0 PS2 18 (SUTURE) ×8 IMPLANT
SUT MON AB 2-0 CT1 36 (SUTURE) ×8 IMPLANT
SUT PDS AB 2-0 CT1 27 (SUTURE) ×8 IMPLANT
SUT PROLENE 0 CT (SUTURE) ×4 IMPLANT
SWAB CULTURE ESWAB REG 1ML (MISCELLANEOUS) IMPLANT
TAPE CLOTH .5X10 WHT NS (GAUZE/BANDAGES/DRESSINGS) ×16 IMPLANT
TOWEL OR 17X24 6PK STRL BLUE (TOWEL DISPOSABLE) ×4 IMPLANT
TOWEL OR 17X26 10 PK STRL BLUE (TOWEL DISPOSABLE) ×8 IMPLANT
TUBE CONNECTING 12X1/4 (SUCTIONS) ×8 IMPLANT
UNDERPAD 30X30 (UNDERPADS AND DIAPERS) ×8 IMPLANT
WATER STERILE IRR 1000ML POUR (IV SOLUTION) IMPLANT
YANKAUER SUCT BULB TIP NO VENT (SUCTIONS) ×8 IMPLANT
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
## 2017-01-03 NOTE — ED Notes (Signed)
Pt has open fracture to right lower leg, deformity to right hand

## 2017-01-03 NOTE — Transfer of Care (Signed)
Immediate Anesthesia Transfer of Care Note  Patient: Mark Turko LoseVernell Appelt Jr.  Procedure(s) Performed: IRRIGATION AND DEBRIDEMENT EXTREMITY (Right Leg Lower) EXTERNAL FIXATION RIGHT ANKLE (Right Ankle) EXTERNAL FIXATION RIGHT WRIST (Right Wrist)  Patient Location: ICU  Anesthesia Type:General  Level of Consciousness: Patient remains intubated per anesthesia plan  Airway & Oxygen Therapy: Patient remains intubated per anesthesia plan  Post-op Assessment: Report given to RN and Post -op Vital signs reviewed and stable  Post vital signs: Reviewed and stable  Last Vitals:  Vitals:   01/03/17 1446 01/03/17 1457  BP: (!) 154/105   Pulse: (!) 115 (!) 112  Resp: (!) 22 15  Temp: (!) 38.4 C   SpO2: 100% 100%    Last Pain:  Vitals:   01/03/17 0906  TempSrc: Tympanic         Complications: No apparent anesthesia complications
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
## 2017-01-03 NOTE — Anesthesia Preprocedure Evaluation (Signed)
Anesthesia Evaluation  Preop documentation limited or incomplete due to emergent nature of procedure.  Airway Mallampati: Intubated       Dental   Pulmonary    + rhonchi        Cardiovascular  Rhythm:Regular Rate:Tachycardia     Neuro/Psych    GI/Hepatic   Endo/Other    Renal/GU      Musculoskeletal   Abdominal   Peds  Hematology   Anesthesia Other Findings   Reproductive/Obstetrics                             Anesthesia Physical Anesthesia Plan  ASA: [name] and emergent  Anesthesia Plan: General   Post-op Pain Management:    Induction: Intravenous  PONV Risk Score and Plan: 3 and Ondansetron and Treatment may vary due to age or medical condition  Airway Management Planned: Oral ETT  Additional Equipment:   Intra-op Plan:   Post-operative Plan: Post-operative intubation/ventilation  Informed Consent: I have reviewed the patients History and Physical, chart, labs and discussed the procedure including the risks, benefits and alternatives for the proposed anesthesia with the patient or authorized representative who has indicated his/her understanding and acceptance.     Plan Discussed with: CRNA  Anesthesia Plan Comments: (+/- aline)        Anesthesia Quick Evaluation

## 2017-01-03 NOTE — ED Notes (Signed)
Unable to do history due to pt being unresponsive
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
## 2017-01-03 NOTE — ED Notes (Signed)
FAST exam done by Trauma MD Lindie SpruceWyatt
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
## 2017-01-03 NOTE — Progress Notes (Signed)
RT assisted on pt transport on vent from ED to CT and returned back to ED. Pt's vitals remained stable throughout.

## 2017-01-03 NOTE — Progress Notes (Signed)
NS PA called with CT read and in EPIC. PA also clarified brace order for pt. Brace refers to C-collar and pt does not need to remain flat.

## 2017-01-03 NOTE — ED Notes (Signed)
Family at beside. Family given emotional support. 
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
## 2017-01-03 NOTE — Progress Notes (Signed)
RT assisted in pt transported on vent from ED to OR.  Pt's vitals remained stable throughout.  CRNA received pt, pt taken off vent for CRNA to use ambu-bag.

## 2017-01-03 NOTE — Anesthesia Postprocedure Evaluation (Signed)
Anesthesia Post Note  Patient: Mark DolphinVernell Trotten Jr.  Procedure(s) Performed: IRRIGATION AND DEBRIDEMENT EXTREMITY (Right Leg Lower) EXTERNAL FIXATION RIGHT ANKLE (Right Ankle) EXTERNAL FIXATION RIGHT WRIST (Right Wrist)     Patient location during evaluation: SICU Anesthesia Type: General Level of consciousness: sedated and patient remains intubated per anesthesia plan Pain management: pain level controlled Vital Signs Assessment: post-procedure vital signs reviewed and stable Respiratory status: patient remains intubated per anesthesia plan Cardiovascular status: stable Anesthetic complications: no    Last Vitals:  Vitals:   01/03/17 1457 01/03/17 1500  BP:  (!) 146/97  Pulse: (!) 112 (!) 110  Resp: 15 16  Temp:  (!) 38.5 C  SpO2: 100% 100%    Last Pain:  Vitals:   01/03/17 0906  TempSrc: Tympanic                 Lewie LoronJohn Keamber Macfadden

## 2017-01-04 ENCOUNTER — Inpatient Hospital Stay (HOSPITAL_COMMUNITY): Payer: Self-pay

## 2017-01-04 LAB — BASIC METABOLIC PANEL
ANION GAP: 8 (ref 5–15)
BUN: 7 mg/dL (ref 6–20)
CHLORIDE: 101 mmol/L (ref 101–111)
CO2: 25 mmol/L (ref 22–32)
Calcium: 7.6 mg/dL — ABNORMAL LOW (ref 8.9–10.3)
Creatinine, Ser: 0.95 mg/dL (ref 0.61–1.24)
Glucose, Bld: 262 mg/dL — ABNORMAL HIGH (ref 65–99)
POTASSIUM: 3.9 mmol/L (ref 3.5–5.1)
SODIUM: 134 mmol/L — AB (ref 135–145)

## 2017-01-04 LAB — CBC
HCT: 30.9 % — ABNORMAL LOW (ref 39.0–52.0)
HEMOGLOBIN: 10 g/dL — AB (ref 13.0–17.0)
MCH: 29.2 pg (ref 26.0–34.0)
MCHC: 32.4 g/dL (ref 30.0–36.0)
MCV: 90.1 fL (ref 78.0–100.0)
Platelets: 162 10*3/uL (ref 150–400)
RBC: 3.43 MIL/uL — AB (ref 4.22–5.81)
RDW: 13.4 % (ref 11.5–15.5)
WBC: 8.1 10*3/uL (ref 4.0–10.5)

## 2017-01-04 LAB — GLUCOSE, CAPILLARY
GLUCOSE-CAPILLARY: 247 mg/dL — AB (ref 65–99)
GLUCOSE-CAPILLARY: 219 mg/dL — AB (ref 65–99)
GLUCOSE-CAPILLARY: 280 mg/dL — AB (ref 65–99)
Glucose-Capillary: 218 mg/dL — ABNORMAL HIGH (ref 65–99)

## 2017-01-04 LAB — HIV ANTIBODY (ROUTINE TESTING W REFLEX): HIV Screen 4th Generation wRfx: NONREACTIVE

## 2017-01-04 MED ORDER — PIVOT 1.5 CAL PO LIQD
1000.0000 mL | ORAL | Status: DC
  Administered 2017-01-04 – 2017-01-07 (×4): 1000 mL

## 2017-01-04 MED ORDER — INSULIN ASPART 100 UNIT/ML ~~LOC~~ SOLN
0.0000 [IU] | SUBCUTANEOUS | Status: DC
  Administered 2017-01-04: 8 [IU] via SUBCUTANEOUS
  Administered 2017-01-05: 11 [IU] via SUBCUTANEOUS
  Administered 2017-01-05: 5 [IU] via SUBCUTANEOUS

## 2017-01-04 MED ORDER — ASPIRIN 325 MG PO TABS
325.0000 mg | ORAL_TABLET | Freq: Every day | ORAL | Status: DC
  Filled 2017-01-04: qty 1

## 2017-01-04 MED ORDER — PRO-STAT SUGAR FREE PO LIQD
30.0000 mL | Freq: Three times a day (TID) | ORAL | Status: DC
  Administered 2017-01-04 – 2017-01-07 (×8): 30 mL
  Filled 2017-01-04 (×8): qty 30

## 2017-01-04 MED ORDER — ASPIRIN 81 MG PO CHEW
324.0000 mg | CHEWABLE_TABLET | Freq: Every day | ORAL | Status: DC
  Administered 2017-01-04 – 2017-01-25 (×19): 324 mg
  Filled 2017-01-04 (×19): qty 4

## 2017-01-04 MED ORDER — INSULIN ASPART 100 UNIT/ML ~~LOC~~ SOLN
0.0000 [IU] | Freq: Three times a day (TID) | SUBCUTANEOUS | Status: DC
  Administered 2017-01-04 (×2): 5 [IU] via SUBCUTANEOUS

## 2017-01-04 MED ORDER — METOPROLOL TARTRATE 5 MG/5ML IV SOLN
5.0000 mg | Freq: Four times a day (QID) | INTRAVENOUS | Status: DC
  Administered 2017-01-04 – 2017-01-05 (×2): 5 mg via INTRAVENOUS
  Filled 2017-01-04 (×2): qty 5
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
## 2017-01-04 NOTE — Progress Notes (Signed)
No acute events Remains intubated PERRL Follows commands bilaterally Continue C-collar for cervical fracture; aspirin for vertebral artery injury x6 months
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
## 2017-01-04 NOTE — Progress Notes (Signed)
Subjective: 1 Day Post-Op Procedure(s) (LRB): IRRIGATION AND DEBRIDEMENT EXTREMITY (Right) EXTERNAL FIXATION RIGHT ANKLE (Right) EXTERNAL FIXATION RIGHT WRIST (Right) Patient intubated, easily aroused, resting comfortably.  Objective: Vital signs in last 24 hours: Temp:  [97.3 F (36.3 C)-102 F (38.9 C)] 99.9 F (37.7 C) (11/03 0800) Pulse Rate:  [68-240] 97 (11/03 0802) Resp:  [0-31] 15 (11/03 0802) BP: (132-204)/(34-136) 157/89 (11/03 0802) SpO2:  [87 %-100 %] 97 % (11/03 0802) FiO2 (%):  [40 %-100 %] 40 % (11/03 0802) Weight:  [90.7 kg (200 lb)] 90.7 kg (200 lb) (11/02 0928)  Intake/Output from previous day: 11/02 0701 - 11/03 0700 In: 5164.5 [I.V.:4754.5; IV Piggyback:410] Out: 3180 [Urine:2655; Emesis/NG output:400; Drains:50; Blood:75] Intake/Output this shift: No intake/output data recorded.   Recent Labs  01/03/17 0908 01/04/17 0417  HGB 12.7* 10.0*    Recent Labs  01/03/17 0908 01/04/17 0417  WBC 12.5* 8.1  RBC 4.29 3.43*  HCT 38.8* 30.9*  PLT 248 162    Recent Labs  01/03/17 0908 01/04/17 0417  NA 132* 134*  K 3.7 3.9  CL 101 101  CO2 23 25  BUN 11 7  CREATININE 0.96 0.95  GLUCOSE 479* 262*  CALCIUM 8.2* 7.6*    Recent Labs  01/03/17 0908  INR 0.92    Incision: dressing intact, proximal pin sites for ankle exfix with some bloody drainage on dressing, wound vac in place and functioninig properly right ankle Capillary refill intact RLE/RUE Assessment/Plan: 1 Day Post-Op Procedure(s) (LRB): IRRIGATION AND DEBRIDEMENT EXTREMITY (Right) EXTERNAL FIXATION RIGHT ANKLE (Right) EXTERNAL FIXATION RIGHT WRIST (Right) NWB RLE NWB RUE dsg changes prn Plan for definitive fixation of multiple fractures once medically stable and swelling permits will continue to follow     Mark Mcguire, Mark Mcguire 01/04/2017, 8:22 AM
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
## 2017-01-05 LAB — MAGNESIUM
MAGNESIUM: 2 mg/dL (ref 1.7–2.4)
Magnesium: 2 mg/dL (ref 1.7–2.4)

## 2017-01-05 LAB — PHOSPHORUS
PHOSPHORUS: 1.9 mg/dL — AB (ref 2.5–4.6)
Phosphorus: 1.5 mg/dL — ABNORMAL LOW (ref 2.5–4.6)

## 2017-01-05 LAB — GLUCOSE, CAPILLARY
GLUCOSE-CAPILLARY: 245 mg/dL — AB (ref 65–99)
GLUCOSE-CAPILLARY: 310 mg/dL — AB (ref 65–99)
GLUCOSE-CAPILLARY: 234 mg/dL — AB (ref 65–99)
GLUCOSE-CAPILLARY: 286 mg/dL — AB (ref 65–99)
Glucose-Capillary: 235 mg/dL — ABNORMAL HIGH (ref 65–99)
Glucose-Capillary: 286 mg/dL — ABNORMAL HIGH (ref 65–99)

## 2017-01-05 MED ORDER — TAMSULOSIN HCL 0.4 MG PO CAPS
0.4000 mg | ORAL_CAPSULE | Freq: Every day | ORAL | Status: DC
  Administered 2017-01-05: 0.4 mg via ORAL
  Filled 2017-01-05: qty 1

## 2017-01-05 MED ORDER — METOPROLOL TARTRATE 5 MG/5ML IV SOLN
5.0000 mg | Freq: Once | INTRAVENOUS | Status: AC
Start: 2017-01-05 — End: 2017-01-05
  Administered 2017-01-05: 5 mg via INTRAVENOUS
  Filled 2017-01-05: qty 5

## 2017-01-05 MED ORDER — INSULIN GLARGINE 100 UNIT/ML ~~LOC~~ SOLN
5.0000 [IU] | Freq: Every day | SUBCUTANEOUS | Status: DC
  Administered 2017-01-05 – 2017-01-06 (×2): 5 [IU] via SUBCUTANEOUS
  Filled 2017-01-05 (×2): qty 0.05

## 2017-01-05 MED ORDER — METOPROLOL TARTRATE 25 MG/10 ML ORAL SUSPENSION
25.0000 mg | Freq: Two times a day (BID) | ORAL | Status: DC
  Administered 2017-01-05 – 2017-01-09 (×8): 25 mg
  Filled 2017-01-05 (×9): qty 10

## 2017-01-05 MED ORDER — IBUPROFEN 100 MG/5ML PO SUSP
600.0000 mg | Freq: Three times a day (TID) | ORAL | Status: DC | PRN
  Administered 2017-01-05 – 2017-01-18 (×9): 600 mg via ORAL
  Filled 2017-01-05 (×10): qty 30

## 2017-01-05 MED ORDER — INSULIN ASPART 100 UNIT/ML ~~LOC~~ SOLN
0.0000 [IU] | SUBCUTANEOUS | Status: DC
  Administered 2017-01-05: 7 [IU] via SUBCUTANEOUS
  Administered 2017-01-05: 11 [IU] via SUBCUTANEOUS
  Administered 2017-01-05 – 2017-01-06 (×3): 7 [IU] via SUBCUTANEOUS
  Administered 2017-01-06: 15 [IU] via SUBCUTANEOUS
  Administered 2017-01-06: 7 [IU] via SUBCUTANEOUS
  Administered 2017-01-06: 11 [IU] via SUBCUTANEOUS
  Administered 2017-01-06: 7 [IU] via SUBCUTANEOUS
  Administered 2017-01-07: 15 [IU] via SUBCUTANEOUS
  Administered 2017-01-07: 11 [IU] via SUBCUTANEOUS

## 2017-01-05 MED ORDER — ENOXAPARIN SODIUM 40 MG/0.4ML ~~LOC~~ SOLN
40.0000 mg | SUBCUTANEOUS | Status: DC
  Administered 2017-01-05 – 2017-01-29 (×23): 40 mg via SUBCUTANEOUS
  Filled 2017-01-05 (×23): qty 0.4

## 2017-01-05 NOTE — Anesthesia Preprocedure Evaluation (Addendum)
Anesthesia Evaluation  Patient identified by MRN, date of birth, ID band Patient unresponsive    Reviewed: Unable to perform ROS - Chart review only  Airway Mallampati: Intubated       Dental no notable dental hx.    Pulmonary  intu;bated   + rhonchi        Cardiovascular  Rhythm:Regular Rate:Tachycardia  tachycardia   Neuro/Psych Cerv fracture    GI/Hepatic   Endo/Other  diabetes  Renal/GU      Musculoskeletal Multiple major bone fractures   Abdominal (+) + obese,   Peds  Hematology  (+) anemia ,   Anesthesia Other Findings   Reproductive/Obstetrics                            Anesthesia Physical Anesthesia Plan  ASA: IV  Anesthesia Plan: General   Post-op Pain Management:    Induction: Intravenous  PONV Risk Score and Plan: 3 and Treatment may vary due to age or medical condition, Ondansetron and Dexamethasone  Airway Management Planned: Oral ETT  Additional Equipment:   Intra-op Plan:   Post-operative Plan: Post-operative intubation/ventilation  Informed Consent: I have reviewed the patients History and Physical, chart, labs and discussed the procedure including the risks, benefits and alternatives for the proposed anesthesia with the patient or authorized representative who has indicated his/her understanding and acceptance.   History available from chart only  Plan Discussed with: CRNA  Anesthesia Plan Comments:        Anesthesia Quick Evaluation

## 2017-01-05 NOTE — Progress Notes (Addendum)
No acute events PERRL Intermittently following commands; otherwise purposeful Ok to resume lovenox Continue aspirin and Aspen collar Will follow from afar; call with questions
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
## 2017-01-05 NOTE — Progress Notes (Signed)
Trauma MD paged with pt temp 102.3 up from 102 even with tylenol given. [facility]

## 2017-01-05 NOTE — Progress Notes (Addendum)
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 Ordered STAT head CT. Neurosurgery PA-C, [place], notified.

## 2017-01-05 NOTE — Progress Notes (Signed)
D/t tachycardia and agitation pt required sedation and was placed back on full vent support.

## 2017-01-06 ENCOUNTER — Inpatient Hospital Stay (HOSPITAL_COMMUNITY): Payer: Self-pay | Admitting: Anesthesiology

## 2017-01-06 ENCOUNTER — Inpatient Hospital Stay (HOSPITAL_COMMUNITY): Payer: Self-pay

## 2017-01-06 ENCOUNTER — Encounter (HOSPITAL_COMMUNITY): Admission: EM | Disposition: A | Discharge status: Rehabilitation Facility | Payer: Self-pay | Source: Home / Self Care

## 2017-01-06 ENCOUNTER — Encounter (HOSPITAL_COMMUNITY): Payer: Self-pay | Admitting: Certified Registered Nurse Anesthetist

## 2017-01-06 HISTORY — PX: ORIF ANKLE FRACTURE: SHX5408

## 2017-01-06 HISTORY — PX: TIBIA IM NAIL INSERTION: SHX2516

## 2017-01-06 LAB — BLOOD PRODUCT ORDER (VERBAL) VERIFICATION

## 2017-01-06 LAB — CBC WITH DIFFERENTIAL/PLATELET
Basophils Absolute: 0 10*3/uL (ref 0.0–0.1)
Basophils Relative: 0 %
EOS ABS: 0.1 10*3/uL (ref 0.0–0.7)
EOS PCT: 1 %
HCT: 29.3 % — ABNORMAL LOW (ref 39.0–52.0)
Hemoglobin: 9.5 g/dL — ABNORMAL LOW (ref 13.0–17.0)
LYMPHS ABS: 1.9 10*3/uL (ref 0.7–4.0)
Lymphocytes Relative: 23 %
MCH: 29.2 pg (ref 26.0–34.0)
MCHC: 32.4 g/dL (ref 30.0–36.0)
MCV: 90.2 fL (ref 78.0–100.0)
Monocytes Absolute: 0.4 10*3/uL (ref 0.1–1.0)
Monocytes Relative: 5 %
Neutro Abs: 5.9 10*3/uL (ref 1.7–7.7)
Neutrophils Relative %: 71 %
PLATELETS: 145 10*3/uL — AB (ref 150–400)
RBC: 3.25 MIL/uL — AB (ref 4.22–5.81)
RDW: 14.6 % (ref 11.5–15.5)
WBC: 8.3 10*3/uL (ref 4.0–10.5)

## 2017-01-06 LAB — GLUCOSE, CAPILLARY
GLUCOSE-CAPILLARY: 224 mg/dL — AB (ref 65–99)
GLUCOSE-CAPILLARY: 198 mg/dL — AB (ref 65–99)
GLUCOSE-CAPILLARY: 207 mg/dL — AB (ref 65–99)
GLUCOSE-CAPILLARY: 248 mg/dL — AB (ref 65–99)
Glucose-Capillary: 315 mg/dL — ABNORMAL HIGH (ref 65–99)
Glucose-Capillary: 223 mg/dL — ABNORMAL HIGH (ref 65–99)

## 2017-01-06 LAB — COMPREHENSIVE METABOLIC PANEL
ALBUMIN: 2.8 g/dL — AB (ref 3.5–5.0)
ALK PHOS: 83 U/L (ref 38–126)
ALT: 26 U/L (ref 17–63)
ANION GAP: 7 (ref 5–15)
AST: 40 U/L (ref 15–41)
BILIRUBIN TOTAL: 0.5 mg/dL (ref 0.3–1.2)
BUN: 12 mg/dL (ref 6–20)
CALCIUM: 8.1 mg/dL — AB (ref 8.9–10.3)
CO2: 29 mmol/L (ref 22–32)
Chloride: 105 mmol/L (ref 101–111)
Creatinine, Ser: 0.72 mg/dL (ref 0.61–1.24)
GLUCOSE: 297 mg/dL — AB (ref 65–99)
Potassium: 3.9 mmol/L (ref 3.5–5.1)
Sodium: 141 mmol/L (ref 135–145)
TOTAL PROTEIN: 5.8 g/dL — AB (ref 6.5–8.1)

## 2017-01-06 LAB — BLOOD GAS, ARTERIAL
Acid-Base Excess: 3.7 mmol/L — ABNORMAL HIGH (ref 0.0–2.0)
Bicarbonate: 29 mmol/L — ABNORMAL HIGH (ref 20.0–28.0)
Drawn by: 280981
FIO2: 40
O2 SAT: 97.9 %
PEEP: 5 cmH2O
Patient temperature: 98.6
RATE: 15 resp/min
VT: 600 mL
pCO2 arterial: 54 mmHg — ABNORMAL HIGH (ref 32.0–48.0)
pH, Arterial: 7.349 — ABNORMAL LOW (ref 7.350–7.450)
pO2, Arterial: 114 mmHg — ABNORMAL HIGH (ref 83.0–108.0)

## 2017-01-06 LAB — CBC
HEMATOCRIT: 25.5 % — AB (ref 39.0–52.0)
HEMOGLOBIN: 7.9 g/dL — AB (ref 13.0–17.0)
MCH: 28.9 pg (ref 26.0–34.0)
MCHC: 31 g/dL (ref 30.0–36.0)
MCV: 93.4 fL (ref 78.0–100.0)
Platelets: 134 10*3/uL — ABNORMAL LOW (ref 150–400)
RBC: 2.73 MIL/uL — ABNORMAL LOW (ref 4.22–5.81)
RDW: 13.3 % (ref 11.5–15.5)
WBC: 7.5 10*3/uL (ref 4.0–10.5)

## 2017-01-06 LAB — MAGNESIUM: Magnesium: 2.1 mg/dL (ref 1.7–2.4)

## 2017-01-06 LAB — PHOSPHORUS: Phosphorus: 2.8 mg/dL (ref 2.5–4.6)

## 2017-01-06 LAB — PREPARE RBC (CROSSMATCH): Order Confirmation: POSITIVE

## 2017-01-06 LAB — SURGICAL PCR SCREEN
MRSA, PCR: NEGATIVE
Staphylococcus aureus: NEGATIVE

## 2017-01-06 LAB — TRIGLYCERIDES: Triglycerides: 653 mg/dL — ABNORMAL HIGH (ref <150)

## 2017-01-06 SURGERY — OPEN REDUCTION INTERNAL FIXATION (ORIF) ANKLE FRACTURE
Anesthesia: General | Site: Leg Lower | Laterality: Right

## 2017-01-06 MED ORDER — FENTANYL CITRATE (PF) 250 MCG/5ML IJ SOLN
INTRAMUSCULAR | Status: AC
Start: 2017-01-06 — End: 2017-01-06
  Filled 2017-01-06: qty 5

## 2017-01-06 MED ORDER — 0.9 % SODIUM CHLORIDE (POUR BTL) OPTIME
TOPICAL | Status: DC | PRN
  Administered 2017-01-06: 1000 mL

## 2017-01-06 MED ORDER — TOBRAMYCIN SULFATE 1.2 G IJ SOLR
INTRAMUSCULAR | Status: DC | PRN
  Administered 2017-01-06: 1.2 g via TOPICAL

## 2017-01-06 MED ORDER — PROPOFOL 10 MG/ML IV BOLUS
INTRAVENOUS | Status: DC | PRN
  Administered 2017-01-06: 50 mg via INTRAVENOUS

## 2017-01-06 MED ORDER — FENTANYL CITRATE (PF) 100 MCG/2ML IJ SOLN
INTRAMUSCULAR | Status: DC | PRN
  Administered 2017-01-06: 100 ug via INTRAVENOUS
  Administered 2017-01-06 (×3): 50 ug via INTRAVENOUS

## 2017-01-06 MED ORDER — ROCURONIUM BROMIDE 50 MG/5ML IV SOSY
PREFILLED_SYRINGE | INTRAVENOUS | Status: DC | PRN
  Administered 2017-01-06: 30 mg via INTRAVENOUS
  Administered 2017-01-06: 40 mg via INTRAVENOUS
  Administered 2017-01-06 (×4): 20 mg via INTRAVENOUS

## 2017-01-06 MED ORDER — CEFAZOLIN SODIUM 1 G IJ SOLR
INTRAMUSCULAR | Status: AC
  Filled 2017-01-06: qty 20

## 2017-01-06 MED ORDER — ROCURONIUM BROMIDE 10 MG/ML (PF) SYRINGE
PREFILLED_SYRINGE | INTRAVENOUS | Status: AC
  Filled 2017-01-06: qty 5

## 2017-01-06 MED ORDER — CEFAZOLIN SODIUM-DEXTROSE 1-4 GM/50ML-% IV SOLN
1.0000 g | Freq: Three times a day (TID) | INTRAVENOUS | Status: AC
  Administered 2017-01-06 – 2017-01-07 (×3): 1 g via INTRAVENOUS
  Filled 2017-01-06 (×3): qty 50

## 2017-01-06 MED ORDER — MIDAZOLAM HCL 2 MG/2ML IJ SOLN
INTRAMUSCULAR | Status: AC
  Administered 2017-01-06: 5 mg via INTRAVENOUS
  Filled 2017-01-06: qty 6

## 2017-01-06 MED ORDER — VECURONIUM BROMIDE 10 MG IV SOLR
INTRAVENOUS | Status: AC
Start: 2017-01-06 — End: 2017-01-06
  Administered 2017-01-06: 10 mg
  Filled 2017-01-06: qty 10

## 2017-01-06 MED ORDER — PROPOFOL 10 MG/ML IV BOLUS
INTRAVENOUS | Status: AC
  Filled 2017-01-06: qty 20

## 2017-01-06 MED ORDER — VANCOMYCIN HCL 1000 MG IV SOLR
INTRAVENOUS | Status: AC
  Filled 2017-01-06: qty 1000

## 2017-01-06 MED ORDER — MIDAZOLAM HCL 2 MG/2ML IJ SOLN
5.0000 mg | Freq: Once | INTRAMUSCULAR | Status: AC
  Administered 2017-01-06: 5 mg via INTRAVENOUS

## 2017-01-06 MED ORDER — DEXMEDETOMIDINE HCL IN NACL 200 MCG/50ML IV SOLN
0.0000 ug/kg/h | INTRAVENOUS | Status: DC
  Administered 2017-01-06: 0.4 ug/kg/h via INTRAVENOUS
  Administered 2017-01-07: 0.9 ug/kg/h via INTRAVENOUS
  Administered 2017-01-07: 0.8 ug/kg/h via INTRAVENOUS
  Administered 2017-01-07: 0.6 ug/kg/h via INTRAVENOUS
  Administered 2017-01-07 (×6): 0.8 ug/kg/h via INTRAVENOUS
  Administered 2017-01-08 (×3): 0.9 ug/kg/h via INTRAVENOUS
  Filled 2017-01-06 (×15): qty 50

## 2017-01-06 MED ORDER — ESMOLOL HCL-SODIUM CHLORIDE 2000 MG/100ML IV SOLN
25.0000 ug/kg/min | INTRAVENOUS | Status: DC
  Administered 2017-01-06 – 2017-01-08 (×3): 25 ug/kg/min via INTRAVENOUS
  Administered 2017-01-08: 200 ug/kg/min via INTRAVENOUS
  Administered 2017-01-08: 100 ug/kg/min via INTRAVENOUS
  Administered 2017-01-08 – 2017-01-09 (×2): 75 ug/kg/min via INTRAVENOUS
  Administered 2017-01-09: 25 ug/kg/min via INTRAVENOUS
  Administered 2017-01-09 – 2017-01-10 (×2): 75 ug/kg/min via INTRAVENOUS
  Administered 2017-01-10: 25 ug/kg/min via INTRAVENOUS
  Administered 2017-01-11: 100 ug/kg/min via INTRAVENOUS
  Administered 2017-01-11: 125 ug/kg/min via INTRAVENOUS
  Administered 2017-01-11: 75 ug/kg/min via INTRAVENOUS
  Administered 2017-01-11: 100 ug/kg/min via INTRAVENOUS
  Administered 2017-01-11: 50 ug/kg/min via INTRAVENOUS
  Administered 2017-01-12: 200 ug/kg/min via INTRAVENOUS
  Filled 2017-01-06 (×21): qty 100

## 2017-01-06 MED ORDER — SODIUM CHLORIDE 0.9 % IV SOLN
Freq: Once | INTRAVENOUS | Status: AC
  Administered 2017-01-06: 11:00:00 via INTRAVENOUS

## 2017-01-06 MED ORDER — DOUBLE ANTIBIOTIC 500-10000 UNIT/GM EX OINT
TOPICAL_OINTMENT | CUTANEOUS | Status: AC
  Filled 2017-01-06: qty 1

## 2017-01-06 MED ORDER — VANCOMYCIN HCL 1000 MG IV SOLR
INTRAVENOUS | Status: DC | PRN
  Administered 2017-01-06: 1000 mg

## 2017-01-06 MED ORDER — CEFAZOLIN SODIUM-DEXTROSE 2-3 GM-%(50ML) IV SOLR
INTRAVENOUS | Status: DC | PRN
  Administered 2017-01-06 (×2): 2 g via INTRAVENOUS

## 2017-01-06 MED ORDER — HYDROMORPHONE HCL 1 MG/ML IJ SOLN
2.0000 mg | Freq: Once | INTRAMUSCULAR | Status: AC
  Administered 2017-01-06: 2 mg via INTRAVENOUS
  Filled 2017-01-06: qty 2

## 2017-01-06 MED ORDER — PROPOFOL 1000 MG/100ML IV EMUL
INTRAVENOUS | Status: AC
  Filled 2017-01-06: qty 100

## 2017-01-06 MED ORDER — TOBRAMYCIN SULFATE 1.2 G IJ SOLR
INTRAMUSCULAR | Status: AC
  Filled 2017-01-06: qty 1.2

## 2017-01-06 MED ORDER — SODIUM CHLORIDE 0.9 % IV SOLN
10.0000 mL/h | Freq: Once | INTRAVENOUS | Status: AC
  Administered 2017-01-16: 08:00:00 via INTRAVENOUS

## 2017-01-06 SURGICAL SUPPLY — 117 items
BANDAGE ACE 3X5.8 VEL STRL LF (GAUZE/BANDAGES/DRESSINGS) ×15 IMPLANT
BANDAGE ACE 4X5 VEL STRL LF (GAUZE/BANDAGES/DRESSINGS) ×5 IMPLANT
BANDAGE ACE 6X5 VEL STRL LF (GAUZE/BANDAGES/DRESSINGS) ×5 IMPLANT
BANDAGE ESMARK 6X9 LF (GAUZE/BANDAGES/DRESSINGS) ×3 IMPLANT
BIT DRILL CALIBRATED 4.2 (BIT) ×3 IMPLANT
BIT DRILL SHORT 3.2MM (DRILL) ×3 IMPLANT
BIT DRILL SHORT 4.2 (BIT) ×6 IMPLANT
BLADE CLIPPER SURG (BLADE) IMPLANT
BLADE SURG 10 STRL SS (BLADE) ×10 IMPLANT
BNDG ESMARK 4X9 LF (GAUZE/BANDAGES/DRESSINGS) ×5 IMPLANT
BNDG ESMARK 6X9 LF (GAUZE/BANDAGES/DRESSINGS) ×5
BNDG GAUZE ELAST 4 BULKY (GAUZE/BANDAGES/DRESSINGS) ×15 IMPLANT
BRUSH SCRUB SURG 4.25 DISP (MISCELLANEOUS) ×10 IMPLANT
CHLORAPREP W/TINT 26ML (MISCELLANEOUS) ×5 IMPLANT
CLOSURE WOUND 1/2 X4 (GAUZE/BANDAGES/DRESSINGS) ×1
CORDS BIPOLAR (ELECTRODE) ×5 IMPLANT
COVER MAYO STAND STRL (DRAPES) ×5 IMPLANT
COVER SURGICAL LIGHT HANDLE (MISCELLANEOUS) ×10 IMPLANT
CUFF TOURNIQUET SINGLE 18IN (TOURNIQUET CUFF) ×5 IMPLANT
CUFF TOURNIQUET SINGLE 24IN (TOURNIQUET CUFF) IMPLANT
DECANTER SPIKE VIAL GLASS SM (MISCELLANEOUS) IMPLANT
DRAIN TLS ROUND 10FR (DRAIN) IMPLANT
DRAPE C-ARM 42X72 X-RAY (DRAPES) ×5 IMPLANT
DRAPE C-ARMOR (DRAPES) ×5 IMPLANT
DRAPE HALF SHEET 40X57 (DRAPES) ×10 IMPLANT
DRAPE INCISE IOBAN 66X45 STRL (DRAPES) IMPLANT
DRAPE OEC MINIVIEW 54X84 (DRAPES) IMPLANT
DRAPE U-SHAPE 47X51 STRL (DRAPES) ×5 IMPLANT
DRILL BIT CALIBRATED 4.2 (BIT) ×5
DRILL BIT SHORT 4.2 (BIT) ×4
DRILL SHORT 3.2MM (DRILL) ×5
DRSG ADAPTIC 3X8 NADH LF (GAUZE/BANDAGES/DRESSINGS) ×5 IMPLANT
DRSG EMULSION OIL 3X3 NADH (GAUZE/BANDAGES/DRESSINGS) IMPLANT
DRSG MEPITEL 4X7.2 (GAUZE/BANDAGES/DRESSINGS) ×5 IMPLANT
DRSG VAC ATS MED SENSATRAC (GAUZE/BANDAGES/DRESSINGS) ×5 IMPLANT
ELECT REM PT RETURN 9FT ADLT (ELECTROSURGICAL) ×5
ELECTRODE REM PT RTRN 9FT ADLT (ELECTROSURGICAL) ×3 IMPLANT
GAUZE SPONGE 4X4 12PLY STRL (GAUZE/BANDAGES/DRESSINGS) ×5 IMPLANT
GAUZE SPONGE 4X4 12PLY STRL LF (GAUZE/BANDAGES/DRESSINGS) ×5 IMPLANT
GAUZE XEROFORM 5X9 LF (GAUZE/BANDAGES/DRESSINGS) ×5 IMPLANT
GLOVE BIO SURGEON STRL SZ7.5 (GLOVE) ×20 IMPLANT
GLOVE BIOGEL M 8.0 STRL (GLOVE) ×5 IMPLANT
GLOVE BIOGEL PI IND STRL 7.5 (GLOVE) ×6 IMPLANT
GLOVE BIOGEL PI INDICATOR 7.5 (GLOVE) ×4
GLOVE SS BIOGEL STRL SZ 8 (GLOVE) ×3 IMPLANT
GLOVE SUPERSENSE BIOGEL SZ 8 (GLOVE) ×2
GLOVE SURG SS PI 6.5 STRL IVOR (GLOVE) ×5 IMPLANT
GLOVE SURG SS PI 8.0 STRL IVOR (GLOVE) ×15 IMPLANT
GOWN L4 XXLG W/PAP TWL (GOWN DISPOSABLE) ×5 IMPLANT
GOWN STRL REUS W/ TWL LRG LVL3 (GOWN DISPOSABLE) ×9 IMPLANT
GOWN STRL REUS W/ TWL XL LVL3 (GOWN DISPOSABLE) ×9 IMPLANT
GOWN STRL REUS W/TWL LRG LVL3 (GOWN DISPOSABLE) ×6
GOWN STRL REUS W/TWL LRG LVL4 (GOWN DISPOSABLE) ×5 IMPLANT
GOWN STRL REUS W/TWL XL LVL3 (GOWN DISPOSABLE) ×6
KIT BASIN OR (CUSTOM PROCEDURE TRAY) ×5 IMPLANT
KIT ROOM TURNOVER OR (KITS) ×5 IMPLANT
MANIFOLD NEPTUNE II (INSTRUMENTS) ×5 IMPLANT
NAIL TIBIAL W/PROX BEND 10X345 (Nail) ×5 IMPLANT
NEEDLE 22X1 1/2 (OR ONLY) (NEEDLE) IMPLANT
NEEDLE HYPO 21X1.5 SAFETY (NEEDLE) IMPLANT
NS IRRIG 1000ML POUR BTL (IV SOLUTION) ×5 IMPLANT
PACK ORTHO EXTREMITY (CUSTOM PROCEDURE TRAY) ×5 IMPLANT
PACK TOTAL JOINT (CUSTOM PROCEDURE TRAY) ×5 IMPLANT
PAD ARMBOARD 7.5X6 YLW CONV (MISCELLANEOUS) ×10 IMPLANT
PAD CAST 4YDX4 CTTN HI CHSV (CAST SUPPLIES) ×3 IMPLANT
PADDING CAST COTTON 4X4 STRL (CAST SUPPLIES) ×2
PADDING CAST COTTON 6X4 STRL (CAST SUPPLIES) ×5 IMPLANT
PROS LCP PLATE 8H 111M (Plate) ×5 IMPLANT
PROSTHESIS LCP PLATE 8H 111M (Plate) ×3 IMPLANT
REAMER ROD DEEP FLUTE 2.5X950 (INSTRUMENTS) ×5 IMPLANT
SCREW CORTEX 3.5 14MM (Screw) ×2 IMPLANT
SCREW CORTEX 3.5 16MM (Screw) ×6 IMPLANT
SCREW CORTEX 3.5 45MM (Screw) ×5 IMPLANT
SCREW CORTEX 3.5 50MM (Screw) ×5 IMPLANT
SCREW CORTEX 3.5 55MM (Screw) ×5 IMPLANT
SCREW CORTEX 3.5 65MM (Screw) ×5 IMPLANT
SCREW LOCK CORT ST 3.5X14 (Screw) ×3 IMPLANT
SCREW LOCK CORT ST 3.5X16 (Screw) ×9 IMPLANT
SCREW LOCK STAR 5X30 (Screw) ×5 IMPLANT
SCREW LOCK STAR 5X34 (Screw) ×5 IMPLANT
SCREW LOCK STAR 5X36 (Screw) ×5 IMPLANT
SCREW LOCK STAR 5X42 (Screw) ×5 IMPLANT
SCREW LOCK STAR 5X60 (Screw) ×5 IMPLANT
SCREW LOCK TITANIUM 4.0X50 (Screw) ×5 IMPLANT
SCREW LOCKING 4.0X40MM (Screw) ×5 IMPLANT
SCREW LOCKING 4X38 (Screw) ×10 IMPLANT
SCRUB BETADINE 4OZ XXX (MISCELLANEOUS) ×5 IMPLANT
SOL PREP POV-IOD 4OZ 10% (MISCELLANEOUS) ×5 IMPLANT
SPONGE LAP 18X18 X RAY DECT (DISPOSABLE) ×5 IMPLANT
SPONGE LAP 4X18 X RAY DECT (DISPOSABLE) IMPLANT
STAPLER VISISTAT 35W (STAPLE) ×5 IMPLANT
STRIP CLOSURE SKIN 1/2X4 (GAUZE/BANDAGES/DRESSINGS) ×4 IMPLANT
SUCTION FRAZIER HANDLE 10FR (MISCELLANEOUS) ×2
SUCTION TUBE FRAZIER 10FR DISP (MISCELLANEOUS) ×3 IMPLANT
SUT ETHILON 3 0 PS 1 (SUTURE) ×15 IMPLANT
SUT MNCRL AB 3-0 PS2 18 (SUTURE) ×5 IMPLANT
SUT MNCRL AB 4-0 PS2 18 (SUTURE) ×5 IMPLANT
SUT MON AB 2-0 CT1 36 (SUTURE) ×15 IMPLANT
SUT PDS AB 2-0 CT1 27 (SUTURE) IMPLANT
SUT PROLENE 0 CT (SUTURE) IMPLANT
SUT PROLENE 3 0 PS 2 (SUTURE) IMPLANT
SUT PROLENE 4 0 PS 2 18 (SUTURE) IMPLANT
SUT VIC AB 0 CT1 27 (SUTURE)
SUT VIC AB 0 CT1 27XBRD ANBCTR (SUTURE) IMPLANT
SUT VIC AB 2-0 CT1 27 (SUTURE) ×6
SUT VIC AB 2-0 CT1 TAPERPNT 27 (SUTURE) ×9 IMPLANT
SUT VIC AB 3-0 FS2 27 (SUTURE) IMPLANT
SYR CONTROL 10ML LL (SYRINGE) IMPLANT
SYSTEM CHEST DRAIN TLS 7FR (DRAIN) IMPLANT
TOWEL OR 17X24 6PK STRL BLUE (TOWEL DISPOSABLE) ×5 IMPLANT
TOWEL OR 17X26 10 PK STRL BLUE (TOWEL DISPOSABLE) ×10 IMPLANT
TUBE CONNECTING 12'X1/4 (SUCTIONS) ×1
TUBE CONNECTING 12X1/4 (SUCTIONS) ×4 IMPLANT
TUBE EVACUATION TLS (MISCELLANEOUS) ×5 IMPLANT
UNDERPAD 30X30 (UNDERPADS AND DIAPERS) ×5 IMPLANT
WATER STERILE IRR 1000ML POUR (IV SOLUTION) ×5 IMPLANT
YANKAUER SUCT BULB TIP NO VENT (SUCTIONS) IMPLANT
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
## 2017-01-06 NOTE — Interval H&P Note (Signed)
History and Physical Interval Note:  01/06/2017 7:15 AM  Mark Devra Doppotten Jr.  has presented today for surgery, with the diagnosis of Right open segmental tibia fracture  The various methods of treatment have been discussed with the patient and family. After consideration of risks, benefits and other options for treatment, the patient has consented to  Procedure(s): OPEN REDUCTION INTERNAL FIXATION (ORIF) ANKLE FRACTURE (Right) INTRAMEDULLARY (IM) NAIL TIBIAL (Right) OPEN REDUCTION INTERNAL FIXATION (ORIF) WRIST FRACTURE (Right) as a surgical intervention .  The patient's history has been reviewed, patient examined, no change in status, stable for surgery.  I have reviewed the patient's chart and labs.  Questions were answered to the patient's satisfaction.     Haddix, Gillie MannersKevin P
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
## 2017-01-06 NOTE — Anesthesia Postprocedure Evaluation (Signed)
Anesthesia Post Note  Patient: Mark LoseVernell Balla Jr.  Procedure(s) Performed: OPEN REDUCTION INTERNAL FIXATION (ORIF) ANKLE FRACTURE (Right Ankle) INTRAMEDULLARY (IM) NAIL TIBIAL (Right Leg Lower)     Anesthesia Type: General    Last Vitals:  Vitals:   01/06/17 1445 01/06/17 1500  BP: (!) 186/114 (!) 186/116  Pulse: (!) 137 (!) 139  Resp: 16 16  Temp:    SpO2: 97% 94%    Last Pain:  Vitals:   01/06/17 1312  TempSrc: Oral                 Layne Lebon,JAMES TERRILL

## 2017-01-06 NOTE — Anesthesia Postprocedure Evaluation (Signed)
Anesthesia Post Note  Patient: Mark LoseVernell Tempesta Jr.  Procedure(s) Performed: OPEN REDUCTION INTERNAL FIXATION (ORIF) ANKLE FRACTURE (Right Ankle) INTRAMEDULLARY (IM) NAIL TIBIAL (Right Leg Lower)     Patient location during evaluation: SICU Anesthesia Type: General Level of consciousness: sedated and patient remains intubated per anesthesia plan Pain management: pain level controlled Vital Signs Assessment: post-procedure vital signs reviewed and stable Respiratory status: patient remains intubated per anesthesia plan Cardiovascular status: stable Postop Assessment: no apparent nausea or vomiting Anesthetic complications: no    Last Vitals:  Vitals:   01/06/17 1331 01/06/17 1400  BP: (!) 170/96 (!) 182/113  Pulse: (!) 117 (!) 123  Resp: 15 15  Temp:    SpO2: 99% 98%    Last Pain:  Vitals:   01/06/17 1312  TempSrc: Oral                 Mark Mcguire,Mark Mcguire

## 2017-01-06 NOTE — Progress Notes (Signed)
Orthopedic Tech Progress Note Patient Details:  Mark LoseVernell Garabedian Jr. 01/25/1981 [id number]  Patient ID: Mark LoseVernell Villalona Jr., male   DOB: 07/24/1980, 36 y.o.   MRN: [id number]   Mark Mcguire, Drema Eddington 01/06/2017, 12:58 PM As ordered by Dr. Jena GaussHaddix

## 2017-01-06 NOTE — Progress Notes (Signed)
Follow up - Trauma and Critical Care  Patient Details:    Mark Mcguire. is an 36 y.o. male.  Lines/tubes : Airway 7.6 mm (Active)  Secured at (cm) 23 cm 01/06/2017  1:31 PM  Measured From Lips 01/06/2017  1:31 PM  Secured Location Center 01/06/2017  1:31 PM  Secured By [employer] 01/06/2017  1:31 PM  Tube Holder Repositioned Yes 01/06/2017  1:31 PM  Cuff Pressure (cm H2O) 26 cm H2O 01/05/2017  7:33 PM  Site Condition Dry 01/06/2017  1:31 PM     Negative Pressure Wound Therapy Leg Right (Active)  Last dressing change 01/03/17 01/06/2017  7:10 AM  Site / Wound Assessment Dressing in place / Unable to assess 01/06/2017  7:10 AM  Cycle Continuous 01/06/2017  7:10 AM  Target Pressure (mmHg) 125 01/06/2017  7:10 AM  Canister Changed No 01/05/2017  8:00 PM  Dressing Status Intact 01/06/2017  7:10 AM  Drainage Amount None 01/06/2017  7:10 AM  Output (mL) 50 mL 01/04/2017  6:13 AM     NG/OG Tube Orogastric (Active)  Site Assessment Clean;Dry;Intact 01/06/2017  7:10 AM  Ongoing Placement Verification No change in respiratory status;No acute changes, not attributed to clinical condition 01/06/2017  7:10 AM  Status Clamped 01/06/2017  7:10 AM  Drainage Appearance None 01/04/2017  7:20 PM  Intake (mL) 100 mL 01/05/2017  8:00 PM  Output (mL) 400 mL 01/04/2017  6:13 AM     Urethral Catheter Oda Cogan, RN Latex 16 Fr. (Active)  Indication for Insertion or Continuance of Catheter Acute urinary retention;Peri-operative use for selective surgical procedure 01/06/2017  7:10 AM  Site Assessment Clean;Intact;Dry 01/06/2017  7:10 AM  Catheter Maintenance Bag below level of bladder;Catheter secured;Drainage bag/tubing not touching floor;Insertion date on drainage bag;No dependent loops;Seal intact 01/06/2017  7:10 AM  Collection Container Standard drainage bag 01/06/2017  7:10 AM  Securement Method Securing device (Describe) 01/06/2017  7:10 AM  Urinary Catheter Interventions Unclamped 01/06/2017  7:10 AM   Output (mL) 80 mL 01/06/2017  7:10 AM    Microbiology/Sepsis markers: Results for orders placed or performed during the hospital encounter of 01/03/17  MRSA PCR Screening     Status: None   Collection Time: 01/03/17  2:43 PM  Result Value Ref Range Status   MRSA by PCR NEGATIVE NEGATIVE Final    Comment:        The GeneXpert MRSA Assay (FDA approved for NASAL specimens only), is one component of a comprehensive MRSA colonization surveillance program. It is not intended to diagnose MRSA infection nor to guide or monitor treatment for MRSA infections.   Surgical pcr screen     Status: None   Collection Time: 01/06/17  5:15 AM  Result Value Ref Range Status   MRSA, PCR NEGATIVE NEGATIVE Final   Staphylococcus aureus NEGATIVE NEGATIVE Final    Comment: (NOTE) The Xpert SA Assay (FDA approved for NASAL specimens in patients 53 years of age and older), is one component of a comprehensive surveillance program. It is not intended to diagnose infection nor to guide or monitor treatment. Performed at [facility], [address], [place], [place] [address]     Anti-infectives:  Anti-infectives (From admission, onward)   Start     Dose/Rate Route Frequency Ordered Stop   01/06/17 1430  ceFAZolin (ANCEF) IVPB 1 g/50 mL premix     1 g 100 mL/hr over 30 Minutes Intravenous Every 8 hours 01/06/17 1339 01/07/17 1429   01/06/17 1202  tobramycin (NEBCIN) powder  Status:  Discontinued       As needed 01/06/17 1203 01/06/17 1258   01/06/17 1132  vancomycin (VANCOCIN) powder  Status:  Discontinued       As needed 01/06/17 1133 01/06/17 1258   01/03/17 1500  ceFAZolin (ANCEF) IVPB 1 g/50 mL premix     1 g 100 mL/hr over 30 Minutes Intravenous Every 8 hours 01/03/17 1445 01/05/17 1459   01/03/17 1231  vancomycin (VANCOCIN) powder  Status:  Discontinued       As needed 01/03/17 1231 01/03/17 1431   01/03/17 1000  ceFAZolin (ANCEF) IVPB 2g/100 mL premix     2 g 200  mL/hr over 30 Minutes Intravenous  Once 01/03/17 0946 01/03/17 1118      Best Practice/Protocols:  VTE Prophylaxis: Lovenox (prophylaxtic dose) and Mechanical GI Prophylaxis: Proton Pump Inhibitor Continous Sedation  Consults: Treatment Team:  Roby Lofts, MD Ditty, Loura Halt, MD    Events:  Subjective:    Overnight Issues: Patient just back from the OR, and he has had problems with hypertension and tachycardia.  Has not seemed to be agitated or awake yet.  Objective:  Vital signs for last 24 hours: Temp:  [98.6 F (37 C)-102.3 F (39.1 C)] 98.6 F (37 C) (11/05 1312) Pulse Rate:  [98-141] 117 (11/05 1331) Resp:  [14-17] 15 (11/05 1331) BP: (105-170)/(64-96) 170/96 (11/05 1331) SpO2:  [86 %-100 %] 99 % (11/05 1331) FiO2 (%):  [40 %] 40 % (11/05 1331) Weight:  [103.7 kg (228 lb 9.9 oz)] 103.7 kg (228 lb 9.9 oz) (11/05 0400)  Hemodynamic parameters for last 24 hours:    Intake/Output from previous day: 11/04 0701 - 11/05 0700 In: 2077.3 [I.V.:1717.3; NG/GT:150; IV Piggyback:210] Out: 3835 [Urine:3835]  Intake/Output this shift: Total I/O In: 2030 [I.V.:1400; Blood:630] Out: 1230 [Urine:930; Blood:300]  Vent settings for last 24 hours: Vent Mode: PRVC FiO2 (%):  [40 %] 40 % Set Rate:  [15 bmp] 15 bmp Vt Set:  [600 mL] 600 mL PEEP:  [5 cmH20] 5 cmH20 Plateau Pressure:  [19 cmH20-22 cmH20] 19 cmH20  Physical Exam:  General: no respiratory distress and not responsive t all. Neuro: nonfocal exam, RASS -2 and pinpoint pupils Resp: wheezes RLL and RML CVS: Sinus tachycardia GI: hypoactive BS and soft, not distended. Extremities: RUE in external fixator.  RLE has been internally fixated.  Results for orders placed or performed during the hospital encounter of 01/03/17 (from the past 24 hour(s))  Glucose, capillary     Status: Abnormal   Collection Time: 01/05/17  4:59 PM  Result Value Ref Range   Glucose-Capillary 235 (H) 65 - 99 mg/dL  Glucose,  capillary     Status: Abnormal   Collection Time: 01/05/17  7:45 PM  Result Value Ref Range   Glucose-Capillary 286 (H) 65 - 99 mg/dL   Comment 1 Notify RN   Magnesium     Status: None   Collection Time: 01/05/17  7:53 PM  Result Value Ref Range   Magnesium 2.0 1.7 - 2.4 mg/dL  Phosphorus     Status: Abnormal   Collection Time: 01/05/17  7:53 PM  Result Value Ref Range   Phosphorus 1.5 (L) 2.5 - 4.6 mg/dL  Glucose, capillary     Status: Abnormal   Collection Time: 01/05/17 11:46 PM  Result Value Ref Range   Glucose-Capillary 286 (H) 65 - 99 mg/dL  Glucose, capillary     Status: Abnormal   Collection Time: 01/06/17  4:11 AM  Result Value Ref Range   Glucose-Capillary 315 (H) 65 - 99 mg/dL  Magnesium     Status: None   Collection Time: 01/06/17  4:38 AM  Result Value Ref Range   Magnesium 2.1 1.7 - 2.4 mg/dL  Phosphorus     Status: None   Collection Time: 01/06/17  4:38 AM  Result Value Ref Range   Phosphorus 2.8 2.5 - 4.6 mg/dL  CBC     Status: Abnormal   Collection Time: 01/06/17  4:38 AM  Result Value Ref Range   WBC 7.5 4.0 - 10.5 K/uL   RBC 2.73 (L) 4.22 - 5.81 MIL/uL   Hemoglobin 7.9 (L) 13.0 - 17.0 g/dL   HCT 16.1 (L) 09.6 - 04.5 %   MCV 93.4 78.0 - 100.0 fL   MCH 28.9 26.0 - 34.0 pg   MCHC 31.0 30.0 - 36.0 g/dL   RDW 40.9 81.1 - 91.4 %   Platelets 134 (L) 150 - 400 K/uL  Comprehensive metabolic panel     Status: Abnormal   Collection Time: 01/06/17  4:38 AM  Result Value Ref Range   Sodium 141 135 - 145 mmol/L   Potassium 3.9 3.5 - 5.1 mmol/L   Chloride 105 101 - 111 mmol/L   CO2 29 22 - 32 mmol/L   Glucose, Bld 297 (H) 65 - 99 mg/dL   BUN 12 6 - 20 mg/dL   Creatinine, Ser 7.82 0.61 - 1.24 mg/dL   Calcium 8.1 (L) 8.9 - 10.3 mg/dL   Total Protein 5.8 (L) 6.5 - 8.1 g/dL   Albumin 2.8 (L) 3.5 - 5.0 g/dL   AST 40 15 - 41 U/L   ALT 26 17 - 63 U/L   Alkaline Phosphatase 83 38 - 126 U/L   Total Bilirubin 0.5 0.3 - 1.2 mg/dL   GFR calc non Af Amer >60 >60  mL/min   GFR calc Af Amer >60 >60 mL/min   Anion gap 7 5 - 15  Surgical pcr screen     Status: None   Collection Time: 01/06/17  5:15 AM  Result Value Ref Range   MRSA, PCR NEGATIVE NEGATIVE   Staphylococcus aureus NEGATIVE NEGATIVE  Triglycerides     Status: Abnormal   Collection Time: 01/06/17  6:44 AM  Result Value Ref Range   Triglycerides 653 (H) <150 mg/dL  Prepare RBC     Status: None   Collection Time: 01/06/17  6:44 AM  Result Value Ref Range   Order Confirmation ORDER PROCESSED BY BLOOD BANK POS   Glucose, capillary     Status: Abnormal   Collection Time: 01/06/17  7:19 AM  Result Value Ref Range   Glucose-Capillary 224 (H) 65 - 99 mg/dL   Comment 1 Notify RN    Comment 2 Document in Chart   Prepare RBC     Status: None   Collection Time: 01/06/17 10:30 AM  Result Value Ref Range   Order Confirmation ORDER PROCESSED BY BLOOD BANK   Provider-confirm verbal Blood Bank order - RBC, FFP, Type & Screen; 2 Units; Order taken: 95621; 31800; Level 1 Trauma, Emergency Release, STAT Two units of uncrossmatched emergency release O negative red blood cells and two units of emergency rel...     Status: None   Collection Time: 01/06/17 11:30 AM  Result Value Ref Range   Blood product order confirm MD AUTHORIZATION REQUESTED   Glucose, capillary     Status: Abnormal   Collection Time: 01/06/17  1:41 PM  Result Value Ref Range   Glucose-Capillary 198 (H) 65 - 99 mg/dL   Comment 1 Notify RN    Comment 2 Document in Chart      Assessment/Plan:   NEURO  Altered Mental Status:  obtundation and sedation   Plan: Trying to get a handle on patient's neuro status.  PULM  Wheesing on the right side.  May need postoperative CXR   Plan: CXR., ABG  CARDIO  Sinus Tachycardia   Plan: May need Esmolol drip currently, but the patient does not have enough lines for this medication.  RENAL  Urine output and renal function are okay.   Plan: CPM  GI  No specific issues   Plan: Resume tube  feedings  ID  No known infectious issues.   Plan: CPM  HEME  Anemia acute blood loss anemia)   Plan: No blood for now  ENDO Hyperglycemia (stress related and recently untreated diabetes)   Plan: CPM  Global Issues  Patient having issues with BP and heart rate.  Not sure what the reasons are yet, but may need to have an Esmolol drip to control.    LOS: 3 days   Additional comments:I reviewed the patient's new clinical lab test results. cbc/bmet and I reviewed the patients new imaging test results. CXR  Critical Care Total Time*: 30 Minutes  Teven Mittman 01/06/2017  *Care during the described time interval was provided by me and/or other providers on the critical care team.  I have reviewed this patient's available data, including medical history, events of note, physical examination and test results as part of my evaluation.

## 2017-01-06 NOTE — Procedures (Signed)
Pt bagged up to 4N22 from OR by CRNA at this time. On arrival to room pt placed on vent, tolerating well. RT will monitor.

## 2017-01-06 NOTE — Procedures (Addendum)
Pt currently in OR, unable to do ventilator check at this time.

## 2017-01-06 NOTE — Progress Notes (Signed)
Orthopedic Tech Progress Note Patient Details:  Mark LoseVernell Laurie Jr. 02/15/1981 [id number]  Ortho Devices Type of Ortho Device: CAM walker Ortho Device/Splint Location: rle Ortho Device/Splint Interventions: [name]   Mark Mcguire 01/06/2017, 12:58 PM

## 2017-01-06 NOTE — Procedures (Signed)
Central Venous Catheter Insertion Procedure Note Nicholas LoseVernell Dexter Jr. [id number] 01/22/1981  Procedure: Insertion of Central Venous Catheter Indications: Drug and/or fluid administration  Procedure Details Consent: Risks of procedure as well as the alternatives and risks of each were explained to the (patient/caregiver).  Consent for procedure obtained. Time Out: Verified patient identification, verified procedure, site/side was marked, verified correct patient position, special equipment/implants available, medications/allergies/relevent history reviewed, required imaging and test results available.  Performed  Maximum sterile technique was used including antiseptics, cap, gloves, gown, hand hygiene, mask and sheet. Skin prep: Chlorhexidine; local anesthetic administered A antimicrobial bonded/coated triple lumen catheter was placed in the left femoral vein due to multiple attempts, no other available access and emergent situation using the Seldinger technique.  Evaluation Blood flow good Complications: No apparent complications Patient did tolerate procedure well. Chest X-ray ordered to verify placement.  CXR: pending.   Attempted left subclavian line initially.  Patient in need of access fro Esmolol drip.  Very hypertensive and tachycardic postoperatively. Kathlyne Loud 01/06/2017, 3:54 PM

## 2017-01-06 NOTE — Transfer of Care (Signed)
Immediate Anesthesia Transfer of Care Note  Patient: Mark LoseVernell Horan Jr.  Procedure(s) Performed: OPEN REDUCTION INTERNAL FIXATION (ORIF) ANKLE FRACTURE (Right Ankle) INTRAMEDULLARY (IM) NAIL TIBIAL (Right Leg Lower)  Patient Location: ICU  Anesthesia Type:General  Level of Consciousness: sedated, unresponsive and Patient remains intubated per anesthesia plan  Airway & Oxygen Therapy: Patient remains intubated per anesthesia plan  Post-op Assessment: Report given to RN and Post -op Vital signs reviewed and stable  Post vital signs: Reviewed and stable  Last Vitals:  Vitals:   01/06/17 0600 01/06/17 0700  BP: 105/69 (!) 141/94  Pulse: 98 (!) 105  Resp: 15 15  Temp:    SpO2: 98% 100%    Last Pain:  Vitals:   01/06/17 0400  TempSrc: Axillary         Complications: No apparent anesthesia complications

## 2017-01-07 ENCOUNTER — Encounter (HOSPITAL_COMMUNITY): Payer: Self-pay

## 2017-01-07 LAB — TYPE AND SCREEN
ABO/RH(D): B POS
Antibody Screen: NEGATIVE
UNIT DIVISION: 0
UNIT DIVISION: 0
UNIT DIVISION: 0
UNIT DIVISION: 0

## 2017-01-07 LAB — CBC
HEMATOCRIT: 25.9 % — AB (ref 39.0–52.0)
HEMOGLOBIN: 8.2 g/dL — AB (ref 13.0–17.0)
MCH: 29.3 pg (ref 26.0–34.0)
MCHC: 31.7 g/dL (ref 30.0–36.0)
MCV: 92.5 fL (ref 78.0–100.0)
Platelets: 168 10*3/uL (ref 150–400)
RBC: 2.8 MIL/uL — AB (ref 4.22–5.81)
RDW: 14.8 % (ref 11.5–15.5)
WBC: 7.6 10*3/uL (ref 4.0–10.5)

## 2017-01-07 LAB — BPAM RBC
BLOOD PRODUCT EXPIRATION DATE: 201811072359
BLOOD PRODUCT EXPIRATION DATE: 201811142359
BLOOD PRODUCT EXPIRATION DATE: 201811162359
Blood Product Expiration Date: 201811212359
ISSUE DATE / TIME: 201811030658
ISSUE DATE / TIME: 201811050955
ISSUE DATE / TIME: 201811030723
ISSUE DATE / TIME: 201811050955
UNIT TYPE AND RH: 7300
UNIT TYPE AND RH: 9500
Unit Type and Rh: 7300
Unit Type and Rh: 9500

## 2017-01-07 LAB — GLUCOSE, CAPILLARY
GLUCOSE-CAPILLARY: 342 mg/dL — AB (ref 65–99)
GLUCOSE-CAPILLARY: 297 mg/dL — AB (ref 65–99)
GLUCOSE-CAPILLARY: 263 mg/dL — AB (ref 65–99)
GLUCOSE-CAPILLARY: 278 mg/dL — AB (ref 65–99)
GLUCOSE-CAPILLARY: 318 mg/dL — AB (ref 65–99)
GLUCOSE-CAPILLARY: 245 mg/dL — AB (ref 65–99)
GLUCOSE-CAPILLARY: 117 mg/dL — AB (ref 65–99)
Glucose-Capillary: 344 mg/dL — ABNORMAL HIGH (ref 65–99)
Glucose-Capillary: 241 mg/dL — ABNORMAL HIGH (ref 65–99)
Glucose-Capillary: 237 mg/dL — ABNORMAL HIGH (ref 65–99)
Glucose-Capillary: 194 mg/dL — ABNORMAL HIGH (ref 65–99)
Glucose-Capillary: 214 mg/dL — ABNORMAL HIGH (ref 65–99)
Glucose-Capillary: 112 mg/dL — ABNORMAL HIGH (ref 65–99)

## 2017-01-07 LAB — POCT I-STAT 4, (NA,K, GLUC, HGB,HCT)
GLUCOSE: 187 mg/dL — AB (ref 65–99)
HCT: 23 % — ABNORMAL LOW (ref 39.0–52.0)
Hemoglobin: 7.8 g/dL — ABNORMAL LOW (ref 13.0–17.0)
POTASSIUM: 3.8 mmol/L (ref 3.5–5.1)
Sodium: 145 mmol/L (ref 135–145)

## 2017-01-07 MED ORDER — BETHANECHOL CHLORIDE 25 MG PO TABS
25.0000 mg | ORAL_TABLET | Freq: Three times a day (TID) | ORAL | Status: DC
Start: 2017-01-07 — End: 2017-01-25
  Administered 2017-01-07 – 2017-01-25 (×51): 25 mg
  Filled 2017-01-07: qty 1
  Filled 2017-01-07 (×31): qty 3
  Filled 2017-01-07: qty 1
  Filled 2017-01-07 (×11): qty 3
  Filled 2017-01-07: qty 1
  Filled 2017-01-07 (×5): qty 3
  Filled 2017-01-07: qty 1

## 2017-01-07 MED ORDER — MIDAZOLAM HCL 2 MG/2ML IJ SOLN
INTRAMUSCULAR | Status: AC
  Administered 2017-01-07: 4 mg via INTRAVENOUS
  Filled 2017-01-07: qty 4

## 2017-01-07 MED ORDER — CHLORHEXIDINE GLUCONATE CLOTH 2 % EX PADS
6.0000 | MEDICATED_PAD | Freq: Every day | CUTANEOUS | Status: DC
  Administered 2017-01-07 – 2017-01-29 (×20): 6 via TOPICAL

## 2017-01-07 MED ORDER — SODIUM CHLORIDE 0.9 % IV SOLN
INTRAVENOUS | Status: DC
  Administered 2017-01-07: 2.4 [IU]/h via INTRAVENOUS
  Administered 2017-01-07: 3.6 [IU]/h via INTRAVENOUS
  Filled 2017-01-07 (×3): qty 1

## 2017-01-07 MED ORDER — INSULIN GLARGINE 100 UNIT/ML ~~LOC~~ SOLN: 15.0000 [IU] | Freq: Every day | SUBCUTANEOUS | Status: DC

## 2017-01-07 MED ORDER — GLUCERNA 1.2 CAL PO LIQD
1000.0000 mL | ORAL | Status: DC
  Administered 2017-01-07: 1000 mL
  Filled 2017-01-07 (×6): qty 1000

## 2017-01-07 MED ORDER — PRO-STAT SUGAR FREE PO LIQD
60.0000 mL | Freq: Three times a day (TID) | ORAL | Status: DC
  Administered 2017-01-07 – 2017-01-19 (×32): 60 mL
  Filled 2017-01-07 (×31): qty 60

## 2017-01-07 MED ORDER — SODIUM CHLORIDE 0.9% FLUSH
10.0000 mL | Freq: Two times a day (BID) | INTRAVENOUS | Status: DC
  Administered 2017-01-07: 10 mL
  Administered 2017-01-07: 30 mL
  Administered 2017-01-08 – 2017-01-09 (×4): 10 mL
  Administered 2017-01-10: 30 mL
  Administered 2017-01-10 – 2017-01-12 (×3): 10 mL
  Administered 2017-01-12: 30 mL
  Administered 2017-01-12 – 2017-01-15 (×7): 10 mL
  Administered 2017-01-16: 30 mL
  Administered 2017-01-17: 10 mL
  Administered 2017-01-17 – 2017-01-18 (×2): 30 mL
  Administered 2017-01-18 – 2017-01-23 (×11): 10 mL
  Administered 2017-01-24: 20 mL
  Administered 2017-01-24: 10 mL
  Administered 2017-01-25: 20 mL
  Administered 2017-01-25: 10 mL
  Administered 2017-01-26 (×2): 20 mL
  Administered 2017-01-27 – 2017-01-29 (×5): 10 mL

## 2017-01-07 MED ORDER — MIDAZOLAM HCL 2 MG/2ML IJ SOLN
4.0000 mg | Freq: Once | INTRAMUSCULAR | Status: AC
  Administered 2017-01-07: 4 mg via INTRAVENOUS

## 2017-01-07 MED ORDER — SODIUM CHLORIDE 0.9% FLUSH: 10.0000 mL | INTRAVENOUS | Status: DC | PRN
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
## 2017-01-07 NOTE — Progress Notes (Signed)
Inpatient Diabetes Program Recommendations  AACE/ADA: New Consensus Statement on Inpatient Glycemic Control (2015)  Target Ranges:  Prepandial:   less than 140 mg/dL      Peak postprandial:   less than 180 mg/dL (1-2 hours)      Critically ill patients:  140 - 180 mg/dL   Results for Nicholas LoseOTTEN, Azaria JR. (MRN [id number]) as of 01/07/2017 09:27  Ref. Range 01/06/2017 07:19 01/06/2017 13:41 01/06/2017 16:40 01/06/2017 19:35 01/06/2017 23:17 01/07/2017 03:17 01/07/2017 07:44  Glucose-Capillary Latest Ref Range: 65 - 99 mg/dL 409224 (H)  Novolog 7 units 198 (H)   223 (H)  Novolog 7 units 207 (H)  Novolog 7 units  Lantus 5 units 248 (H)  Novolog 7 units 344 (H)  Novolog 11 units 342 (H)  Novolog 15 units    Review of Glycemic Control  Diabetes history: Unknown Outpatient Diabetes medications: unknown Current orders for Inpatient glycemic control: Lantus 15 units QHS, Novolog 0-20 units Q4H  Inpatient Diabetes Program Recommendations:  Insulin - IV drip/GlucoStabilizer: Glucose over the past 12 hours has ranged from 248-344 mg/dl. Noted Lantus was increased today from 5 to 15 units QHS. Anticipate this will not be enough insulin to improve glycemic control. Please consider discontinuing all insulin orders and use ICU Glycemic Control order set to order Phase 2 IV insulin (which will improve glycemic control and help determine insulin needs).  Thanks, Orlando PennerMarie Purity Irmen, RN, MSN, CDE Diabetes Coordinator Inpatient Diabetes Program [phone] (Team Pager from 8am to 5pm)

## 2017-01-07 NOTE — Progress Notes (Signed)
Orthopaedic Trauma Progress Note  S: Was tachycardic and febrile overnight. Following intermittent commands  O:  Vitals:   01/07/17 1207 01/07/17 1300  BP: (!) 159/68 (!) 159/57  Pulse: (!) 105 (!) 103  Resp: (!) 23 (!) 21  Temp:    SpO2: 97% 98%  RLE: Boot in place, Ace wrap clean, dry and intact. Compartments soft and compressible. Incisional vac without output. Warm and well perfused toes. Not following commands  RUE: Ex-fix in placed. ACE wrap clean, dry and intact. Wiggles fingers, warm and well perfused fingers. Compartments soft and compressible  Labs: Hgb 408.492  A/P: 36 year old male with right open segmental tibia fracture and right ankle fracture s/p IMN/ORIF and right distal radius fracture s/p external fixator  -NWB RLE/RUE -Ancef postoperatively -Incisional vac to RLE -Plan tentatively for ORIF of wrist tomorrow, pending OR availability -NPO past midnight -Okay to start DVT prophylaxis today  Roby LoftsKevin P. Haddix, MD [facility] [phone] (phone)
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
## 2017-01-07 NOTE — Progress Notes (Signed)
Peripherally Inserted Central Catheter/Midline Placement  The IV Nurse has discussed with the patient and/or persons authorized to consent for the patient, the purpose of this procedure and the potential benefits and risks involved with this procedure.  The benefits include less needle sticks, lab draws from the catheter, and the patient may be discharged home with the catheter. Risks include, but not limited to, infection, bleeding, blood clot (thrombus formation), and puncture of an artery; nerve damage and irregular heartbeat and possibility to perform a PICC exchange if needed/ordered by physician.  Alternatives to this procedure were also discussed.  Bard Power PICC patient education guide, fact sheet on infection prevention and patient information card has been provided to patient /or left at bedside.    PICC/Midline Placement Documentation     Consent obtained via telephone with mother , [name]   [name] Renee 01/07/2017, 9:53 AM
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
## 2017-01-08 ENCOUNTER — Inpatient Hospital Stay (HOSPITAL_COMMUNITY): Payer: Self-pay

## 2017-01-08 ENCOUNTER — Inpatient Hospital Stay (HOSPITAL_COMMUNITY): Payer: Self-pay | Admitting: Critical Care Medicine

## 2017-01-08 ENCOUNTER — Encounter (HOSPITAL_COMMUNITY): Payer: Self-pay | Admitting: Certified Registered Nurse Anesthetist

## 2017-01-08 ENCOUNTER — Encounter (HOSPITAL_COMMUNITY): Payer: Self-pay | Admitting: Student

## 2017-01-08 LAB — BASIC METABOLIC PANEL
ANION GAP: 6 (ref 5–15)
BUN: 14 mg/dL (ref 6–20)
CALCIUM: 8 mg/dL — AB (ref 8.9–10.3)
CHLORIDE: 113 mmol/L — AB (ref 101–111)
CO2: 29 mmol/L (ref 22–32)
Creatinine, Ser: 0.77 mg/dL (ref 0.61–1.24)
GFR calc non Af Amer: >60 mL/min (ref >60)
Glucose, Bld: 134 mg/dL — ABNORMAL HIGH (ref 65–99)
Potassium: 3.7 mmol/L (ref 3.5–5.1)
SODIUM: 148 mmol/L — AB (ref 135–145)

## 2017-01-08 LAB — GLUCOSE, CAPILLARY
GLUCOSE-CAPILLARY: 127 mg/dL — AB (ref 65–99)
GLUCOSE-CAPILLARY: 143 mg/dL — AB (ref 65–99)
GLUCOSE-CAPILLARY: 153 mg/dL — AB (ref 65–99)
GLUCOSE-CAPILLARY: 154 mg/dL — AB (ref 65–99)
GLUCOSE-CAPILLARY: 194 mg/dL — AB (ref 65–99)
GLUCOSE-CAPILLARY: 205 mg/dL — AB (ref 65–99)
Glucose-Capillary: 112 mg/dL — ABNORMAL HIGH (ref 65–99)
Glucose-Capillary: 131 mg/dL — ABNORMAL HIGH (ref 65–99)
Glucose-Capillary: 144 mg/dL — ABNORMAL HIGH (ref 65–99)
Glucose-Capillary: 152 mg/dL — ABNORMAL HIGH (ref 65–99)
Glucose-Capillary: 160 mg/dL — ABNORMAL HIGH (ref 65–99)
Glucose-Capillary: 164 mg/dL — ABNORMAL HIGH (ref 65–99)
Glucose-Capillary: 198 mg/dL — ABNORMAL HIGH (ref 65–99)
Glucose-Capillary: 226 mg/dL — ABNORMAL HIGH (ref 65–99)
Glucose-Capillary: 248 mg/dL — ABNORMAL HIGH (ref 65–99)
Glucose-Capillary: 251 mg/dL — ABNORMAL HIGH (ref 65–99)

## 2017-01-08 LAB — CBC
HCT: 25 % — ABNORMAL LOW (ref 39.0–52.0)
HEMOGLOBIN: 7.7 g/dL — AB (ref 13.0–17.0)
MCH: 28.6 pg (ref 26.0–34.0)
MCHC: 30.8 g/dL (ref 30.0–36.0)
MCV: 92.9 fL (ref 78.0–100.0)
Platelets: 176 10*3/uL (ref 150–400)
RBC: 2.69 MIL/uL — ABNORMAL LOW (ref 4.22–5.81)
RDW: 14.5 % (ref 11.5–15.5)
WBC: 8.6 10*3/uL (ref 4.0–10.5)

## 2017-01-08 MED ORDER — INSULIN ASPART 100 UNIT/ML ~~LOC~~ SOLN
2.0000 [IU] | SUBCUTANEOUS | Status: DC
  Administered 2017-01-08: 4 [IU] via SUBCUTANEOUS
  Administered 2017-01-08 (×2): 6 [IU] via SUBCUTANEOUS
  Administered 2017-01-09 (×2): 4 [IU] via SUBCUTANEOUS
  Administered 2017-01-09: 2 [IU] via SUBCUTANEOUS
  Administered 2017-01-09 – 2017-01-10 (×6): 4 [IU] via SUBCUTANEOUS
  Administered 2017-01-10: 6 [IU] via SUBCUTANEOUS
  Administered 2017-01-10: 2 [IU] via SUBCUTANEOUS
  Administered 2017-01-10 – 2017-01-11 (×2): 4 [IU] via SUBCUTANEOUS
  Administered 2017-01-11: 6 [IU] via SUBCUTANEOUS

## 2017-01-08 MED ORDER — MIDAZOLAM HCL 2 MG/2ML IJ SOLN: 2.0000 mg | Freq: Once | INTRAMUSCULAR | Status: DC | PRN

## 2017-01-08 MED ORDER — SODIUM CHLORIDE 0.9 % IV SOLN
2.0000 mg/h | INTRAVENOUS | Status: DC
  Administered 2017-01-08: 4 mg/h via INTRAVENOUS
  Administered 2017-01-09: 6 mg/h via INTRAVENOUS
  Administered 2017-01-09: 4 mg/h via INTRAVENOUS
  Administered 2017-01-10 (×2): 6 mg/h via INTRAVENOUS
  Administered 2017-01-11 (×3): 10 mg/h via INTRAVENOUS
  Filled 2017-01-08 (×8): qty 10

## 2017-01-08 MED ORDER — PROPOFOL 10 MG/ML IV BOLUS
INTRAVENOUS | Status: DC | PRN
  Administered 2017-01-08: 100 mg via INTRAVENOUS

## 2017-01-08 MED ORDER — FENTANYL BOLUS VIA INFUSION
50.0000 ug | INTRAVENOUS | Status: DC | PRN
Start: 2017-01-08 — End: 2017-01-12
  Administered 2017-01-12 (×2): 50 ug via INTRAVENOUS
  Filled 2017-01-08: qty 50

## 2017-01-08 MED ORDER — VECURONIUM BROMIDE 10 MG IV SOLR
10.0000 mg | Freq: Once | INTRAVENOUS | Status: AC
Start: 2017-01-08 — End: 2017-01-08
  Administered 2017-01-08: 10 mg via INTRAVENOUS
  Filled 2017-01-08: qty 10

## 2017-01-08 MED ORDER — ORAL CARE MOUTH RINSE
15.0000 mL | OROMUCOSAL | Status: DC
  Administered 2017-01-08 – 2017-01-20 (×115): 15 mL via OROMUCOSAL

## 2017-01-08 MED ORDER — DEXMEDETOMIDINE HCL IN NACL 200 MCG/50ML IV SOLN
0.0000 ug/kg/h | INTRAVENOUS | Status: DC
  Administered 2017-01-08 (×2): 1.2 ug/kg/h via INTRAVENOUS
  Filled 2017-01-08 (×4): qty 50

## 2017-01-08 MED ORDER — CHLORHEXIDINE GLUCONATE 0.12% ORAL RINSE (MEDLINE KIT)
15.0000 mL | Freq: Two times a day (BID) | OROMUCOSAL | Status: DC
  Administered 2017-01-08 – 2017-01-29 (×40): 15 mL via OROMUCOSAL

## 2017-01-08 MED ORDER — PIPERACILLIN-TAZOBACTAM 3.375 G IVPB
3.3750 g | Freq: Three times a day (TID) | INTRAVENOUS | Status: DC
Start: 2017-01-08 — End: 2017-01-15
  Administered 2017-01-08 – 2017-01-15 (×20): 3.375 g via INTRAVENOUS
  Filled 2017-01-08 (×21): qty 50

## 2017-01-08 MED ORDER — CLONIDINE HCL 0.2 MG/24HR TD PTWK
0.2000 mg | MEDICATED_PATCH | TRANSDERMAL | Status: DC
  Administered 2017-01-08: 0.2 mg via TRANSDERMAL
  Filled 2017-01-08: qty 1

## 2017-01-08 MED ORDER — FENTANYL CITRATE (PF) 100 MCG/2ML IJ SOLN
50.0000 ug | Freq: Once | INTRAMUSCULAR | Status: AC
  Administered 2017-01-08: 50 ug via INTRAVENOUS

## 2017-01-08 MED ORDER — MIDAZOLAM HCL 2 MG/2ML IJ SOLN
2.0000 mg | Freq: Once | INTRAMUSCULAR | Status: AC
  Administered 2017-01-08: 2 mg via INTRAVENOUS

## 2017-01-08 MED ORDER — ARTIFICIAL TEARS OPHTHALMIC OINT
1.0000 "application " | TOPICAL_OINTMENT | Freq: Three times a day (TID) | OPHTHALMIC | Status: DC
  Administered 2017-01-08 – 2017-01-14 (×17): 1 via OPHTHALMIC
  Filled 2017-01-08: qty 3.5

## 2017-01-08 MED ORDER — LIDOCAINE HCL (CARDIAC) 20 MG/ML IV SOLN
INTRAVENOUS | Status: DC | PRN
  Administered 2017-01-08: 100 mg via INTRAVENOUS

## 2017-01-08 MED ORDER — MIDAZOLAM BOLUS VIA INFUSION
2.0000 mg | INTRAVENOUS | Status: DC | PRN
  Administered 2017-01-09: 2 mg via INTRAVENOUS
  Filled 2017-01-08: qty 2

## 2017-01-08 MED ORDER — SODIUM CHLORIDE 0.9 % IV SOLN
0.8000 ug/kg/min | INTRAVENOUS | Status: DC
  Administered 2017-01-09: 0.8 ug/kg/min via INTRAVENOUS
  Administered 2017-01-10: 1.4 ug/kg/min via INTRAVENOUS
  Administered 2017-01-10: 1 ug/kg/min via INTRAVENOUS
  Administered 2017-01-11: 1.7 ug/kg/min via INTRAVENOUS
  Filled 2017-01-08 (×5): qty 100

## 2017-01-08 MED ORDER — ORAL CARE MOUTH RINSE
15.0000 mL | Freq: Two times a day (BID) | OROMUCOSAL | Status: DC
Start: 2017-01-08 — End: 2017-01-08
  Administered 2017-01-08: 15 mL via OROMUCOSAL

## 2017-01-08 MED ORDER — INSULIN GLARGINE 100 UNIT/ML ~~LOC~~ SOLN
30.0000 [IU] | SUBCUTANEOUS | Status: DC
  Administered 2017-01-08 – 2017-01-11 (×4): 30 [IU] via SUBCUTANEOUS
  Filled 2017-01-08 (×4): qty 0.3

## 2017-01-08 MED ORDER — SUCCINYLCHOLINE CHLORIDE 20 MG/ML IJ SOLN
INTRAMUSCULAR | Status: DC | PRN
  Administered 2017-01-08: 100 mg via INTRAVENOUS

## 2017-01-08 MED ORDER — PIPERACILLIN-TAZOBACTAM 3.375 G IVPB 30 MIN
3.3750 g | Freq: Once | INTRAVENOUS | Status: AC
  Administered 2017-01-08: 3.375 g via INTRAVENOUS
  Filled 2017-01-08: qty 50

## 2017-01-08 MED ORDER — VECURONIUM BOLUS VIA INFUSION
10.0000 mg | Freq: Once | INTRAVENOUS | Status: AC
  Administered 2017-01-08: 10 mg via INTRAVENOUS
  Filled 2017-01-08: qty 10

## 2017-01-08 MED ORDER — FENTANYL 2500MCG IN NS 250ML (10MCG/ML) PREMIX INFUSION
25.0000 ug/h | INTRAVENOUS | Status: DC
  Administered 2017-01-08: 400 ug/h via INTRAVENOUS
  Administered 2017-01-09: 300 ug/h via INTRAVENOUS
  Administered 2017-01-09: 225 ug/h via INTRAVENOUS
  Administered 2017-01-10 (×3): 300 ug/h via INTRAVENOUS
  Administered 2017-01-11 (×2): 400 ug/h via INTRAVENOUS
  Filled 2017-01-08 (×8): qty 250

## 2017-01-08 NOTE — Procedures (Signed)
Called to room by RN due to decreased spo2 after episode of vomiting.  On arrival spo2 85% on 100% NRB, MD at bedside. CRNA called to intubate pt.  Intubated with #7.5 ETT secured 24@ lip. +ETCo2 color change, =BBS. Pt placed on vent previous settings, increased peep to 10 per MD due to spo2 86%.  Spo2 now 100%, RT will monitor.

## 2017-01-08 NOTE — Progress Notes (Signed)
Orthopaedic Trauma Progress Note  S: Late entry, patient saw this AM. Persistently febrile overnight. Blood cultures drawn. Unsure of source  O:  Vitals:   01/08/17 1300 01/08/17 1421  BP: (!) 208/111   Pulse: 95   Resp: 17   Temp: 100 F (37.8 C)   SpO2: 99% 100%  RLE: Boot in place, Ace wrap clean, dry and intact. Compartments soft and compressible. Incisional vac without output. Warm and well perfused toes. Not following commands  RUE: Ex-fix in placed. ACE wrap clean, dry and intact. Wiggles fingers, warm and well perfused fingers. Compartments soft and compressible  A/P: 36 year old male with right open segmental tibia fracture and right ankle fracture s/p IMN/ORIF and right distal radius fracture s/p external fixator  -NWB RLE/RUE -Incisional vac to RLE -Delayed surgery due to persistent fevers -Will delay surgery until Friday to find source.  Roby LoftsKevin P. Jc Veron, MD [facility] [phone] (phone)

## 2017-01-08 NOTE — Progress Notes (Signed)
Patient was extubated earlier and doing well until he vomited and aspirated bilious fluid.  He subsequently desaturated and had to be reintubated for hypoxemia.  CXR is pending.  Will start him on empiric antibiotics.  This patient has been seen and I agree with the findings and treatment plan.  Marta LamasJames O. Gae BonWyatt, [name], MD, FACS [phone] (pager) [phone] (direct pager) Trauma Surgeon

## 2017-01-08 NOTE — Progress Notes (Signed)
Pt remains hypertensive on esmolol drip. Dr. Lindie SpruceWyatt aware. See assoc orders. Pt also desat 88% on FiO2 100%, verbal order to increase PEEP to 12 from Dr. Lindie SpruceWyatt

## 2017-01-08 NOTE — Progress Notes (Signed)
Follow up - Trauma and Critical Care  Patient Details:    Mark Mcguire. is an 36 y.o. male.  Lines/tubes : Airway 7.6 mm (Active)  Secured at (cm) 22 cm 01/08/2017  7:40 AM  Measured From Lips 01/08/2017  7:40 AM  Secured Location Left 01/08/2017  7:40 AM  Secured By [employer] 01/08/2017  7:40 AM  Tube Holder Repositioned Yes 01/08/2017  7:40 AM  Cuff Pressure (cm H2O) 28 cm H2O 01/07/2017  7:42 PM  Site Condition Dry 01/08/2017  7:40 AM     PICC Triple Lumen 01/07/17 PICC Left Basilic 48 cm 3 cm (Active)  Indication for Insertion or Continuance of Line Vasoactive infusions;Prolonged intravenous therapies;Limited venous access - need for IV therapy >5 days (PICC only) 01/07/2017  8:00 PM  Exposed Catheter (cm) 3 cm 01/07/2017  9:00 AM  Site Assessment Other (Comment) 01/07/2017  8:00 PM  Lumen #1 Status In-line blood sampling system in place 01/07/2017  8:00 PM  Lumen #2 Status Infusing;Flushed 01/07/2017  8:00 PM  Lumen #3 Status Infusing;Flushed 01/07/2017  8:00 PM  Dressing Type Pressure;Gauze 01/07/2017  8:00 PM  Dressing Status Antimicrobial disc in place;Other (Comment);Old drainage 01/07/2017  8:00 PM  Dressing Change Due 01/14/17 01/07/2017  8:00 PM     Negative Pressure Wound Therapy Leg Right (Active)  Last dressing change 01/03/17 01/07/2017  4:00 AM  Site / Wound Assessment Dressing in place / Unable to assess 01/07/2017  8:00 PM  Cycle Continuous 01/07/2017  8:00 PM  Target Pressure (mmHg) 125 01/07/2017  8:00 PM  Canister Changed No 01/07/2017  8:00 PM  Dressing Status Intact 01/07/2017  8:00 PM  Drainage Amount Scant 01/07/2017  8:00 PM  Output (mL) 0 mL 01/08/2017  7:00 AM     NG/OG Tube Orogastric (Active)  Site Assessment Clean;Dry;Intact 01/07/2017  8:00 PM  Ongoing Placement Verification No change in respiratory status;No acute changes, not attributed to clinical condition 01/07/2017  8:00 PM  Status Infusing tube feed 01/07/2017  8:00 PM  Drainage Appearance None  01/07/2017  8:00 PM  Intake (mL) 100 mL 01/05/2017  8:00 PM  Output (mL) 400 mL 01/04/2017  6:13 AM    Microbiology/Sepsis markers: Results for orders placed or performed during the hospital encounter of 01/03/17  MRSA PCR Screening     Status: None   Collection Time: 01/03/17  2:43 PM  Result Value Ref Range Status   MRSA by PCR NEGATIVE NEGATIVE Final    Comment:        The GeneXpert MRSA Assay (FDA approved for NASAL specimens only), is one component of a comprehensive MRSA colonization surveillance program. It is not intended to diagnose MRSA infection nor to guide or monitor treatment for MRSA infections.   Surgical pcr screen     Status: None   Collection Time: 01/06/17  5:15 AM  Result Value Ref Range Status   MRSA, PCR NEGATIVE NEGATIVE Final   Staphylococcus aureus NEGATIVE NEGATIVE Final    Comment: (NOTE) The Xpert SA Assay (FDA approved for NASAL specimens in patients 45 years of age and older), is one component of a comprehensive surveillance program. It is not intended to diagnose infection nor to guide or monitor treatment. Performed at [facility], [address] [place], [place] [address]     Anti-infectives:  Anti-infectives (From admission, onward)   Start     Dose/Rate Route Frequency Ordered Stop   01/06/17 1430  ceFAZolin (ANCEF) IVPB 1 g/50 mL premix  1 g 100 mL/hr over 30 Minutes Intravenous Every 8 hours 01/06/17 1339 01/07/17 0534   01/06/17 1202  tobramycin (NEBCIN) powder  Status:  Discontinued       As needed 01/06/17 1203 01/06/17 1258   01/06/17 1132  vancomycin (VANCOCIN) powder  Status:  Discontinued       As needed 01/06/17 1133 01/06/17 1258   01/03/17 1500  ceFAZolin (ANCEF) IVPB 1 g/50 mL premix     1 g 100 mL/hr over 30 Minutes Intravenous Every 8 hours 01/03/17 1445 01/05/17 1459   01/03/17 1231  vancomycin (VANCOCIN) powder  Status:  Discontinued       As needed 01/03/17 1231 01/03/17 1431   01/03/17 1000   ceFAZolin (ANCEF) IVPB 2g/100 mL premix     2 g 200 mL/hr over 30 Minutes Intravenous  Once 01/03/17 0946 01/03/17 1118      Best Practice/Protocols:  VTE Prophylaxis: Lovenox (prophylaxtic dose) and Mechanical GI Prophylaxis: Proton Pump Inhibitor Continous Sedation  Consults: Treatment Team:  Roby Lofts, MD Ditty, Loura Halt, MD    Events:  Subjective:    Overnight Issues: Also on esmolol drip for hypertension and tachycardia.  Still very hypertensive.  Objective:  Vital signs for last 24 hours: Temp:  [101.4 F (38.6 C)-102.8 F (39.3 C)] 102.8 F (39.3 C) (11/07 0530) Pulse Rate:  [91-124] 91 (11/07 0800) Resp:  [15-23] 15 (11/07 0800) BP: (87-180)/(41-80) 180/80 (11/07 0800) SpO2:  [93 %-100 %] 98 % (11/07 0800) FiO2 (%):  [40 %] 40 % (11/07 0740) Weight:  [105.5 kg (232 lb 9.4 oz)] 105.5 kg (232 lb 9.4 oz) (11/07 0500)  Hemodynamic parameters for last 24 hours:    Intake/Output from previous day: 11/06 0701 - 11/07 0700 In: 3918.5 [I.V.:2779.5; NG/GT:823.9; IV Piggyback:315] Out: 2010 [Urine:2010]  Intake/Output this shift: No intake/output data recorded.  Vent settings for last 24 hours: Vent Mode: PRVC FiO2 (%):  [40 %] 40 % Set Rate:  [15 bmp] 15 bmp Vt Set:  [600 mL] 600 mL PEEP:  [5 cmH20] 5 cmH20 Pressure Support:  [5 cmH20] 5 cmH20 Plateau Pressure:  [13 cmH20-20 cmH20] 20 cmH20  Physical Exam:  General: alert, no respiratory distress and eyes open and follwoing commands. Neuro: alert, oriented, RASS 0 and weakness right lower extremity Resp: clear to auscultation bilaterally and CXR shows haziness on the left side, but otherwise clear. CVS: sinus tachycardia GI: soft, nontender, BS WNL, no r/g and had been getting and tolerating tube feedings.  Will hold for possible extubatiion today. Extremities: Right wrist in external fixator.  Results for orders placed or performed during the hospital encounter of 01/03/17 (from the past 24  hour(s))  Glucose, capillary     Status: Abnormal   Collection Time: 01/07/17 10:56 AM  Result Value Ref Range   Glucose-Capillary 297 (H) 65 - 99 mg/dL  Glucose, capillary     Status: Abnormal   Collection Time: 01/07/17 12:08 PM  Result Value Ref Range   Glucose-Capillary 263 (H) 65 - 99 mg/dL  CBC     Status: Abnormal   Collection Time: 01/07/17 12:54 PM  Result Value Ref Range   WBC 7.6 4.0 - 10.5 K/uL   RBC 2.80 (L) 4.22 - 5.81 MIL/uL   Hemoglobin 8.2 (L) 13.0 - 17.0 g/dL   HCT 11.9 (L) 14.7 - 82.9 %   MCV 92.5 78.0 - 100.0 fL   MCH 29.3 26.0 - 34.0 pg   MCHC 31.7 30.0 - 36.0 g/dL  RDW 14.8 11.5 - 15.5 %   Platelets 168 150 - 400 K/uL  Glucose, capillary     Status: Abnormal   Collection Time: 01/07/17  1:06 PM  Result Value Ref Range   Glucose-Capillary 241 (H) 65 - 99 mg/dL  Glucose, capillary     Status: Abnormal   Collection Time: 01/07/17  2:47 PM  Result Value Ref Range   Glucose-Capillary 237 (H) 65 - 99 mg/dL  Glucose, capillary     Status: Abnormal   Collection Time: 01/07/17  3:47 PM  Result Value Ref Range   Glucose-Capillary 278 (H) 65 - 99 mg/dL  Glucose, capillary     Status: Abnormal   Collection Time: 01/07/17  4:47 PM  Result Value Ref Range   Glucose-Capillary 318 (H) 65 - 99 mg/dL  Glucose, capillary     Status: Abnormal   Collection Time: 01/07/17  5:42 PM  Result Value Ref Range   Glucose-Capillary 245 (H) 65 - 99 mg/dL  Glucose, capillary     Status: Abnormal   Collection Time: 01/07/17  7:12 PM  Result Value Ref Range   Glucose-Capillary 194 (H) 65 - 99 mg/dL  Glucose, capillary     Status: Abnormal   Collection Time: 01/07/17  7:29 PM  Result Value Ref Range   Glucose-Capillary 214 (H) 65 - 99 mg/dL  Glucose, capillary     Status: Abnormal   Collection Time: 01/07/17 10:10 PM  Result Value Ref Range   Glucose-Capillary 117 (H) 65 - 99 mg/dL  Glucose, capillary     Status: Abnormal   Collection Time: 01/07/17 11:06 PM  Result Value  Ref Range   Glucose-Capillary 112 (H) 65 - 99 mg/dL  Glucose, capillary     Status: Abnormal   Collection Time: 01/08/17 12:23 AM  Result Value Ref Range   Glucose-Capillary 205 (H) 65 - 99 mg/dL  Glucose, capillary     Status: Abnormal   Collection Time: 01/08/17  1:33 AM  Result Value Ref Range   Glucose-Capillary 251 (H) 65 - 99 mg/dL  Glucose, capillary     Status: Abnormal   Collection Time: 01/08/17  2:43 AM  Result Value Ref Range   Glucose-Capillary 198 (H) 65 - 99 mg/dL  Glucose, capillary     Status: Abnormal   Collection Time: 01/08/17  3:36 AM  Result Value Ref Range   Glucose-Capillary 164 (H) 65 - 99 mg/dL  Glucose, capillary     Status: Abnormal   Collection Time: 01/08/17  4:54 AM  Result Value Ref Range   Glucose-Capillary 112 (H) 65 - 99 mg/dL  CBC     Status: Abnormal   Collection Time: 01/08/17  5:00 AM  Result Value Ref Range   WBC 8.6 4.0 - 10.5 K/uL   RBC 2.69 (L) 4.22 - 5.81 MIL/uL   Hemoglobin 7.7 (L) 13.0 - 17.0 g/dL   HCT 08.625.0 (L) 57.839.0 - 46.952.0 %   MCV 92.9 78.0 - 100.0 fL   MCH 28.6 26.0 - 34.0 pg   MCHC 30.8 30.0 - 36.0 g/dL   RDW 62.914.5 52.811.5 - 41.315.5 %   Platelets 176 150 - 400 K/uL  Basic metabolic panel     Status: Abnormal   Collection Time: 01/08/17  5:00 AM  Result Value Ref Range   Sodium 148 (H) 135 - 145 mmol/L   Potassium 3.7 3.5 - 5.1 mmol/L   Chloride 113 (H) 101 - 111 mmol/L   CO2 29 22 - 32 mmol/L  Glucose, Bld 134 (H) 65 - 99 mg/dL   BUN 14 6 - 20 mg/dL   Creatinine, Ser 6.040.77 0.61 - 1.24 mg/dL   Calcium 8.0 (L) 8.9 - 10.3 mg/dL   GFR calc non Af Amer >60 >60 mL/min   GFR calc Af Amer >60 >60 mL/min   Anion gap 6 5 - 15  Glucose, capillary     Status: Abnormal   Collection Time: 01/08/17  5:57 AM  Result Value Ref Range   Glucose-Capillary 152 (H) 65 - 99 mg/dL  Glucose, capillary     Status: Abnormal   Collection Time: 01/08/17  6:55 AM  Result Value Ref Range   Glucose-Capillary 143 (H) 65 - 99 mg/dL  Glucose, capillary      Status: Abnormal   Collection Time: 01/08/17  8:14 AM  Result Value Ref Range   Glucose-Capillary 127 (H) 65 - 99 mg/dL     Assessment/Plan:   NEURO  Altered Mental Status:  agitation and sedation   Plan: Will wean appropriately for possible extubation.  PULM  No specific issues   Plan: Wean for extubatiion  CARDIO  Sinus Tachycardia   Plan: Being controlled with esmolol.  May improve with extubation  RENAL  Urine output and renal function are good.   Plan: Diuresis with Lasix x 1 today.  GI  No specific issues.   Plan: CPM  ID  No known infectious sources.   Plan: CPM.  If he spikes a fever again, may need to start empiric antibiotics.  HEME  Anemia acute blood loss anemia)   Plan: No blood needed currently.  ENDO No current issues.  Hyperglycemia better controlled.   Plan: CPM  Global Issues  Trying to wean the patient for extubation.  Holding tube feedings for now.  Surgery not until Friday because of fevers.  May need to start empiric antibiotics if fever persists.  No positive cultures.    LOS: 5 days   Additional comments:I reviewed the patient's new clinical lab test results. cbc/bmet and I reviewed the patients new imaging test results. cxr  Critical Care Total Time*: 45 Minutes  Gwendolynn Merkey 01/08/2017  *Care during the described time interval was provided by me and/or other providers on the critical care team.  I have reviewed this patient's available data, including medical history, events of note, physical examination and test results as part of my evaluation.

## 2017-01-08 NOTE — Anesthesia Procedure Notes (Signed)
Procedure Name: Intubation Date/Time: 01/08/2017 2:17 PM Performed by: Rachel MouldsLee, Damani Rando B, CRNA Pre-anesthesia Checklist: Patient identified, Emergency Drugs available, Suction available, Patient being monitored and Timeout performed Patient Re-evaluated:Patient Re-evaluated prior to induction Oxygen Delivery Method: Ambu bag Preoxygenation: Pre-oxygenation with 100% oxygen Induction Type: IV induction Ventilation: Mask ventilation without difficulty Grade View: Grade I Tube type: Subglottic suction tube Tube size: 7.5 mm Number of attempts: 1 Airway Equipment and Method: Stylet and Video-laryngoscopy Placement Confirmation: ETT inserted through vocal cords under direct vision,  positive ETCO2,  CO2 detector and breath sounds checked- equal and bilateral Secured at: 23 cm Tube secured with: Tape Dental Injury: Teeth and Oropharynx as per pre-operative assessment
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
## 2017-01-08 NOTE — Progress Notes (Signed)
Positive cuff leak noted, pt extubated to 4lpm [place] sats 96%. Patient verbal and in no distress at this time. RT will continue to monitor.
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
## 2017-01-08 NOTE — Progress Notes (Signed)
Inpatient Diabetes Program Recommendations  AACE/ADA: New Consensus Statement on Inpatient Glycemic Control (2015)  Target Ranges:  Prepandial:   less than 140 mg/dL      Peak postprandial:   less than 180 mg/dL (1-2 hours)      Critically ill patients:  140 - 180 mg/dL   Results for Mark Mcguire, Mark JR. (MRN [id number]) as of 01/08/2017 13:47  Ref. Range 01/08/2017 06:55 01/08/2017 08:14 01/08/2017 09:25 01/08/2017 10:20 01/08/2017 12:11 01/08/2017 13:09  Glucose-Capillary Latest Ref Range: 65 - 99 mg/dL 528143 (H) 413127 (H) 244154 (H) 144 (H) 131 (H) 153 (H)   Diabetes history:DM2 hx (per patient's mother) Outpatient Diabetes medications: None Current orders for Inpatient glycemic control:Lantus 30 units Q24H, Novolog 2-6 units Q4H  NOTE: Spoke with patient's mother at bedside and she reports that patient had DM2 and was on Metformin in the past. She reports that [facility] in the past and it has been about 1 1/2 years since [facility] or seen a doctor. She reports that he only took the Metformin for a short time before he stopped using it. Patient's mother reports that patient was not checking glucose at home. No A1C ordered and would not be accurate at this point due to low hemoglobin. Patient's mother states that she has no idea why patient's glucose was initially low (Per EMS glucose was 26 mg/dl). Patient is on IV insulin drip but is currently being transitioned to SQ insulin. [facility] was last prescribed Metformin 500 mg BID for DM when he was seen over a year ago.  Will continue to follow.  Thanks, Orlando PennerMarie Salbador Fiveash, RN, MSN, CDE Diabetes Coordinator Inpatient Diabetes Program [phone] (Team Pager from 8am to 5pm)

## 2017-01-08 NOTE — Progress Notes (Addendum)
Pt projectile vomited, SpO2 78-84%. Paged Dr. Lindie SpruceWyatt. At bedside w/ anesthesia and respiratory.

## 2017-01-09 ENCOUNTER — Inpatient Hospital Stay (HOSPITAL_COMMUNITY): Payer: Self-pay

## 2017-01-09 ENCOUNTER — Other Ambulatory Visit: Payer: Self-pay

## 2017-01-09 ENCOUNTER — Encounter (HOSPITAL_COMMUNITY): Payer: Self-pay | Admitting: Student

## 2017-01-09 LAB — POCT I-STAT 3, ART BLOOD GAS (G3+)
Acid-Base Excess: 9 mmol/L — ABNORMAL HIGH (ref 0.0–2.0)
Bicarbonate: 35.7 mmol/L — ABNORMAL HIGH (ref 20.0–28.0)
O2 SAT: 97 %
PCO2 ART: 67.7 mmHg — AB (ref 32.0–48.0)
PH ART: 7.335 — AB (ref 7.350–7.450)
TCO2: 38 mmol/L — AB (ref 22–32)
pO2, Arterial: 102 mmHg (ref 83.0–108.0)

## 2017-01-09 LAB — BLOOD CULTURE ID PANEL (REFLEXED)
Acinetobacter baumannii: NOT DETECTED
CANDIDA GLABRATA: NOT DETECTED
CANDIDA KRUSEI: NOT DETECTED
CANDIDA PARAPSILOSIS: NOT DETECTED
CANDIDA TROPICALIS: NOT DETECTED
Candida albicans: NOT DETECTED
ENTEROBACTER CLOACAE COMPLEX: NOT DETECTED
ENTEROCOCCUS SPECIES: NOT DETECTED
ESCHERICHIA COLI: NOT DETECTED
Enterobacteriaceae species: NOT DETECTED
Haemophilus influenzae: NOT DETECTED
KLEBSIELLA OXYTOCA: NOT DETECTED
KLEBSIELLA PNEUMONIAE: NOT DETECTED
LISTERIA MONOCYTOGENES: NOT DETECTED
Methicillin resistance: NOT DETECTED
Neisseria meningitidis: NOT DETECTED
PROTEUS SPECIES: NOT DETECTED
Pseudomonas aeruginosa: NOT DETECTED
SERRATIA MARCESCENS: NOT DETECTED
STAPHYLOCOCCUS AUREUS BCID: NOT DETECTED
STAPHYLOCOCCUS SPECIES: DETECTED — AB
Streptococcus agalactiae: NOT DETECTED
Streptococcus pneumoniae: NOT DETECTED
Streptococcus pyogenes: NOT DETECTED
Streptococcus species: NOT DETECTED

## 2017-01-09 LAB — CBC WITH DIFFERENTIAL/PLATELET
BASOS ABS: 0 10*3/uL (ref 0.0–0.1)
BASOS PCT: 0 %
EOS PCT: 1 %
Eosinophils Absolute: 0.1 10*3/uL (ref 0.0–0.7)
HCT: 26.5 % — ABNORMAL LOW (ref 39.0–52.0)
Hemoglobin: 8 g/dL — ABNORMAL LOW (ref 13.0–17.0)
Lymphocytes Relative: 14 %
Lymphs Abs: 1.3 10*3/uL (ref 0.7–4.0)
MCH: 28.3 pg (ref 26.0–34.0)
MCHC: 30.2 g/dL (ref 30.0–36.0)
MCV: 93.6 fL (ref 78.0–100.0)
MONO ABS: 0.7 10*3/uL (ref 0.1–1.0)
Monocytes Relative: 8 %
NEUTROS ABS: 6.9 10*3/uL (ref 1.7–7.7)
Neutrophils Relative %: 77 %
Platelets: 195 10*3/uL (ref 150–400)
RBC: 2.83 MIL/uL — AB (ref 4.22–5.81)
RDW: 14.1 % (ref 11.5–15.5)
WBC: 9 10*3/uL (ref 4.0–10.5)

## 2017-01-09 LAB — GLUCOSE, CAPILLARY
GLUCOSE-CAPILLARY: 170 mg/dL — AB (ref 65–99)
GLUCOSE-CAPILLARY: 176 mg/dL — AB (ref 65–99)
Glucose-Capillary: 199 mg/dL — ABNORMAL HIGH (ref 65–99)
Glucose-Capillary: 196 mg/dL — ABNORMAL HIGH (ref 65–99)
Glucose-Capillary: 171 mg/dL — ABNORMAL HIGH (ref 65–99)
Glucose-Capillary: 190 mg/dL — ABNORMAL HIGH (ref 65–99)

## 2017-01-09 LAB — BLOOD GAS, ARTERIAL
ACID-BASE EXCESS: 6.7 mmol/L — AB (ref 0.0–2.0)
Acid-Base Excess: 3.1 mmol/L — ABNORMAL HIGH (ref 0.0–2.0)
BICARBONATE: 31.5 mmol/L — AB (ref 20.0–28.0)
Bicarbonate: 27.6 mmol/L (ref 20.0–28.0)
Drawn by: 235881
Drawn by: 365271
FIO2: 100
FIO2: 60
LHR: 15 {breaths}/min
LHR: 15 {breaths}/min
MECHVT: 600 mL
O2 SAT: 99.4 %
O2 Saturation: 94.3 %
PEEP/CPAP: 12 cmH2O
PEEP: 10 cmH2O
Patient temperature: 98.6
Patient temperature: 99.7
VT: 600 mL
pCO2 arterial: 46 mmHg (ref 32.0–48.0)
pCO2 arterial: 54.7 mmHg — ABNORMAL HIGH (ref 32.0–48.0)
pH, Arterial: 7.396 (ref 7.350–7.450)
pH, Arterial: 7.382 (ref 7.350–7.450)
pO2, Arterial: 266 mmHg — ABNORMAL HIGH (ref 83.0–108.0)
pO2, Arterial: 78 mmHg — ABNORMAL LOW (ref 83.0–108.0)

## 2017-01-09 LAB — BASIC METABOLIC PANEL
ANION GAP: 6 (ref 5–15)
BUN: 15 mg/dL (ref 6–20)
CALCIUM: 8.2 mg/dL — AB (ref 8.9–10.3)
CO2: 31 mmol/L (ref 22–32)
Chloride: 107 mmol/L (ref 101–111)
Creatinine, Ser: 0.71 mg/dL (ref 0.61–1.24)
GFR calc non Af Amer: >60 mL/min (ref >60)
Glucose, Bld: 205 mg/dL — ABNORMAL HIGH (ref 65–99)
POTASSIUM: 4.1 mmol/L (ref 3.5–5.1)
Sodium: 144 mmol/L (ref 135–145)

## 2017-01-09 MED ORDER — SODIUM CHLORIDE 0.9 % IV SOLN: INTRAVENOUS | Status: DC | PRN

## 2017-01-09 MED ORDER — IPRATROPIUM-ALBUTEROL 0.5-2.5 (3) MG/3ML IN SOLN
3.0000 mL | Freq: Four times a day (QID) | RESPIRATORY_TRACT | Status: DC
  Administered 2017-01-09 – 2017-01-12 (×13): 3 mL via RESPIRATORY_TRACT
  Filled 2017-01-09 (×12): qty 3

## 2017-01-09 NOTE — Progress Notes (Signed)
PHARMACY - PHYSICIAN COMMUNICATION CRITICAL VALUE ALERT - BLOOD CULTURE IDENTIFICATION (BCID)  Results for orders placed or performed during the hospital encounter of 01/03/17  Blood Culture ID Panel (Reflexed) (Collected: 01/08/2017  6:21 AM)  Result Value Ref Range   Enterococcus species NOT DETECTED NOT DETECTED   Listeria monocytogenes NOT DETECTED NOT DETECTED   Staphylococcus species DETECTED (A) NOT DETECTED   Staphylococcus aureus NOT DETECTED NOT DETECTED   Methicillin resistance NOT DETECTED NOT DETECTED   Streptococcus species NOT DETECTED NOT DETECTED   Streptococcus agalactiae NOT DETECTED NOT DETECTED   Streptococcus pneumoniae NOT DETECTED NOT DETECTED   Streptococcus pyogenes NOT DETECTED NOT DETECTED   Acinetobacter baumannii NOT DETECTED NOT DETECTED   Enterobacteriaceae species NOT DETECTED NOT DETECTED   Enterobacter cloacae complex NOT DETECTED NOT DETECTED   Escherichia coli NOT DETECTED NOT DETECTED   Klebsiella oxytoca NOT DETECTED NOT DETECTED   Klebsiella pneumoniae NOT DETECTED NOT DETECTED   Proteus species NOT DETECTED NOT DETECTED   Serratia marcescens NOT DETECTED NOT DETECTED   Haemophilus influenzae NOT DETECTED NOT DETECTED   Neisseria meningitidis NOT DETECTED NOT DETECTED   Pseudomonas aeruginosa NOT DETECTED NOT DETECTED   Candida albicans NOT DETECTED NOT DETECTED   Candida glabrata NOT DETECTED NOT DETECTED   Candida krusei NOT DETECTED NOT DETECTED   Candida parapsilosis NOT DETECTED NOT DETECTED   Candida tropicalis NOT DETECTED NOT DETECTED    Name of physician (or Provider) Contacted: Dr. Janee Mornhompson  Changes to prescribed antibiotics required: D/c Vancomycin, likely a contaminate. Continue Zosyn due to concern for aspiration PNA.  Mark Mcguire, Mark Mcguire 01/09/2017  10:41 AM

## 2017-01-09 NOTE — Progress Notes (Signed)
Pt SpO2 78%, SBP 250s. Dr. Janee Mornhompson and RT at bedside. See assoc orders.
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
## 2017-01-09 NOTE — Plan of Care (Signed)
Will administer suppository

## 2017-01-09 NOTE — Progress Notes (Signed)
Start ARDS protocol per DR. Janee Mornhompson. PEEP changed by MD from 12 to 14. Continued to desat in 100%, 6 cc of 450, RR of 16, and PEEP of 14, recruitment maneuver performed at patient continued to sat in low 80's. Patient PEEP increased to 16. Per ABG CO2 65, RR increased to 20.
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
## 2017-01-09 NOTE — Progress Notes (Signed)
Orthopaedic Trauma Progress Note  S: Extubated and reintubated yesterday. Aspirated and with ARDS.   O:  Vitals:   01/09/17 0913 01/09/17 1002  BP:  (!) 173/80  Pulse:  (!) 123  Resp:    Temp:    SpO2: 97%   RLE: Boot in place, Ace wrap clean, dry and intact. Compartments soft and compressible. Incisional vac without output. Warm and well perfused toes. Not following commands  RUE: Ex-fix in placed. ACE wrap clean, dry and intact. Wiggles fingers, warm and well perfused fingers. Compartments soft and compressible  Labs: Blood cultures with GPCs  A/P: 36 year old male with right open segmental tibia fracture and right ankle fracture s/p IMN/ORIF and right distal radius fracture s/p external fixator  -NWB RLE/RUE -Incisional vac to RLE -Will delay ORIF for now due to lung issues and bactermia, likely plan for next week -Will take down dressing tomorrow  Roby LoftsKevin P. Kelsi Benham, MD [facility] [phone] (phone)

## 2017-01-10 ENCOUNTER — Inpatient Hospital Stay (HOSPITAL_COMMUNITY): Payer: Self-pay

## 2017-01-10 ENCOUNTER — Encounter (HOSPITAL_COMMUNITY): Admission: EM | Disposition: A | Discharge status: Rehabilitation Facility | Payer: Self-pay | Source: Home / Self Care

## 2017-01-10 DIAGNOSIS — J9601 Acute respiratory failure with hypoxia: Secondary | ICD-10-CM

## 2017-01-10 LAB — POCT I-STAT 3, ART BLOOD GAS (G3+)
ACID-BASE EXCESS: 6 mmol/L — AB (ref 0.0–2.0)
Acid-Base Excess: 7 mmol/L — ABNORMAL HIGH (ref 0.0–2.0)
Acid-Base Excess: 9 mmol/L — ABNORMAL HIGH (ref 0.0–2.0)
Acid-Base Excess: 8 mmol/L — ABNORMAL HIGH (ref 0.0–2.0)
BICARBONATE: 35.5 mmol/L — AB (ref 20.0–28.0)
Bicarbonate: 33.4 mmol/L — ABNORMAL HIGH (ref 20.0–28.0)
Bicarbonate: 37.6 mmol/L — ABNORMAL HIGH (ref 20.0–28.0)
Bicarbonate: 32.2 mmol/L — ABNORMAL HIGH (ref 20.0–28.0)
O2 Saturation: 94 %
O2 Saturation: 99 %
O2 Saturation: 100 %
O2 Saturation: 83 %
PCO2 ART: 59.3 mmHg — AB (ref 32.0–48.0)
PH ART: 7.363 (ref 7.350–7.450)
PH ART: 7.254 — AB (ref 7.350–7.450)
PH ART: 7.285 — AB (ref 7.350–7.450)
PH ART: 7.379 (ref 7.350–7.450)
PO2 ART: 81 mmHg — AB (ref 83.0–108.0)
Patient temperature: 100.4
TCO2: 35 mmol/L — ABNORMAL HIGH (ref 22–32)
TCO2: 40 mmol/L — ABNORMAL HIGH (ref 22–32)
TCO2: 38 mmol/L — ABNORMAL HIGH (ref 22–32)
TCO2: 34 mmol/L — ABNORMAL HIGH (ref 22–32)
pCO2 arterial: 86.3 mmHg (ref 32.0–48.0)
pCO2 arterial: 75.7 mmHg (ref 32.0–48.0)
pCO2 arterial: 55 mmHg — ABNORMAL HIGH (ref 32.0–48.0)
pO2, Arterial: 182 mmHg — ABNORMAL HIGH (ref 83.0–108.0)
pO2, Arterial: 235 mmHg — ABNORMAL HIGH (ref 83.0–108.0)
pO2, Arterial: 52 mmHg — ABNORMAL LOW (ref 83.0–108.0)

## 2017-01-10 LAB — BLOOD GAS, ARTERIAL
ACID-BASE EXCESS: 6.9 mmol/L — AB (ref 0.0–2.0)
Bicarbonate: 32.2 mmol/L — ABNORMAL HIGH (ref 20.0–28.0)
DRAWN BY: 270271
FIO2: 100
O2 SAT: 88 %
PCO2 ART: 57.4 mmHg — AB (ref 32.0–48.0)
PEEP/CPAP: 16 cmH2O
Patient temperature: 98.6
RATE: 20 resp/min
VT: 450 mL
pH, Arterial: 7.367 (ref 7.350–7.450)
pO2, Arterial: 57.5 mmHg — ABNORMAL LOW (ref 83.0–108.0)

## 2017-01-10 LAB — BASIC METABOLIC PANEL
Anion gap: 4 — ABNORMAL LOW (ref 5–15)
BUN: 20 mg/dL (ref 6–20)
CALCIUM: 8.4 mg/dL — AB (ref 8.9–10.3)
CHLORIDE: 108 mmol/L (ref 101–111)
CO2: 31 mmol/L (ref 22–32)
Creatinine, Ser: 0.69 mg/dL (ref 0.61–1.24)
GFR calc Af Amer: >60 mL/min (ref >60)
GFR calc non Af Amer: >60 mL/min (ref >60)
GLUCOSE: 171 mg/dL — AB (ref 65–99)
POTASSIUM: 3.9 mmol/L (ref 3.5–5.1)
Sodium: 143 mmol/L (ref 135–145)

## 2017-01-10 LAB — CBC
HEMATOCRIT: 23.9 % — AB (ref 39.0–52.0)
HEMOGLOBIN: 7.3 g/dL — AB (ref 13.0–17.0)
MCH: 29 pg (ref 26.0–34.0)
MCHC: 30.5 g/dL (ref 30.0–36.0)
MCV: 94.8 fL (ref 78.0–100.0)
Platelets: 232 10*3/uL (ref 150–400)
RBC: 2.52 MIL/uL — ABNORMAL LOW (ref 4.22–5.81)
RDW: 14 % (ref 11.5–15.5)
WBC: 7.3 10*3/uL (ref 4.0–10.5)

## 2017-01-10 LAB — GLUCOSE, CAPILLARY
GLUCOSE-CAPILLARY: 138 mg/dL — AB (ref 65–99)
GLUCOSE-CAPILLARY: 180 mg/dL — AB (ref 65–99)
GLUCOSE-CAPILLARY: 180 mg/dL — AB (ref 65–99)
Glucose-Capillary: 187 mg/dL — ABNORMAL HIGH (ref 65–99)
Glucose-Capillary: 209 mg/dL — ABNORMAL HIGH (ref 65–99)
Glucose-Capillary: 199 mg/dL — ABNORMAL HIGH (ref 65–99)

## 2017-01-10 LAB — CULTURE, BLOOD (ROUTINE X 2): SPECIAL REQUESTS: ADEQUATE

## 2017-01-10 LAB — PROCALCITONIN: PROCALCITONIN: 0.48 ng/mL

## 2017-01-10 SURGERY — OPEN REDUCTION INTERNAL FIXATION (ORIF) WRIST FRACTURE
Anesthesia: General | Laterality: Right

## 2017-01-10 MED ORDER — METOPROLOL TARTRATE 25 MG/10 ML ORAL SUSPENSION: 50.0000 mg | Freq: Two times a day (BID) | ORAL | Status: DC

## 2017-01-10 MED ORDER — GLUCERNA 1.2 CAL PO LIQD
1000.0000 mL | ORAL | Status: DC
  Administered 2017-01-10: 1000 mL
  Filled 2017-01-10 (×2): qty 1000

## 2017-01-10 MED ORDER — INFLUENZA VAC SPLIT QUAD 0.5 ML IM SUSY
0.5000 mL | PREFILLED_SYRINGE | INTRAMUSCULAR | Status: AC
  Administered 2017-01-11: 0.5 mL via INTRAMUSCULAR
  Filled 2017-01-10: qty 0.5

## 2017-01-10 MED ORDER — METOPROLOL TARTRATE 25 MG/10 ML ORAL SUSPENSION
50.0000 mg | Freq: Two times a day (BID) | ORAL | Status: DC
  Administered 2017-01-10 – 2017-01-13 (×8): 50 mg
  Filled 2017-01-10 (×7): qty 20

## 2017-01-10 MED ORDER — PNEUMOCOCCAL VAC POLYVALENT 25 MCG/0.5ML IJ INJ
0.5000 mL | INJECTION | INTRAMUSCULAR | Status: AC
  Administered 2017-01-11: 0.5 mL via INTRAMUSCULAR
  Filled 2017-01-10: qty 0.5
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
## 2017-01-10 NOTE — Progress Notes (Signed)
Notified Dr. Andrey CampanileWilson regarding pt low urine output. No follow up action at this time. Will continue to monitor.

## 2017-01-10 NOTE — Progress Notes (Signed)
MD notified of PaCO2 value of 86 on last ABG. Time High changed to 4.2 seconds per MD. Plan to obtain follow up ABG in one hour.

## 2017-01-10 NOTE — Progress Notes (Signed)
Trauma MD paged with increasing BP. MD order to restart esmolol gtt to be used for BP.
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
## 2017-01-10 NOTE — Progress Notes (Signed)
Patient ID: Mark LoseVernell Cleary Jr., male   DOB: 01/15/1981, 36 y.o.   MRN: [id number]   LOS: 7 days   Subjective: Sedated, on vent.   Objective: Vital signs in last 24 hours: Temp:  [98.6 F (37 C)-100.8 F (38.2 C)] 99.9 F (37.7 C) (11/09 0900) Pulse Rate:  [84-123] 87 (11/09 0900) Resp:  [15-20] 20 (11/09 0900) BP: (117-254)/(49-87) 124/71 (11/09 0900) SpO2:  [92 %-100 %] 93 % (11/09 0900) FiO2 (%):  [70 %-100 %] 70 % (11/09 0818) Weight:  [108 kg (238 lb 1.6 oz)] 108 kg (238 lb 1.6 oz) (11/09 0600) Last BM Date: (no value/pta)   Laboratory  CBC Recent Labs    01/09/17 0455 01/10/17 0342  WBC 9.0 7.3  HGB 8.0* 7.3*  HCT 26.5* 23.9*  PLT 195 232   BMET Recent Labs    01/09/17 0455 01/10/17 0342  NA 144 143  K 4.1 3.9  CL 107 108  CO2 31 31  GLUCOSE 205* 171*  BUN 15 20  CREATININE 0.71 0.69  CALCIUM 8.2* 8.4*     Physical Exam General appearance: no distress RUE: Fingers warm, 2+ radial art RLE: Toes warm, <2s cap refill, dressing intact and clean   Assessment/Plan: Right distal radius fx s/p ex fix -- ORIF next week depending on lung recovery. NWB. Right open segmental tibia fxs and ankle fx s/p IMN/ORIF -- Incisional VAC removed, dressing changed. NWB.    Freeman CaldronMichael J. Lashena Signer, PA-C Orthopedic Surgery [phone] 01/10/2017
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
## 2017-01-11 ENCOUNTER — Inpatient Hospital Stay (HOSPITAL_COMMUNITY): Payer: Self-pay

## 2017-01-11 ENCOUNTER — Encounter (HOSPITAL_COMMUNITY): Payer: Self-pay | Admitting: *Deleted

## 2017-01-11 LAB — POCT I-STAT 3, ART BLOOD GAS (G3+)
ACID-BASE EXCESS: 4 mmol/L — AB (ref 0.0–2.0)
Acid-Base Excess: 11 mmol/L — ABNORMAL HIGH (ref 0.0–2.0)
Acid-Base Excess: 6 mmol/L — ABNORMAL HIGH (ref 0.0–2.0)
BICARBONATE: 35.1 mmol/L — AB (ref 20.0–28.0)
BICARBONATE: 31.7 mmol/L — AB (ref 20.0–28.0)
Bicarbonate: 37.5 mmol/L — ABNORMAL HIGH (ref 20.0–28.0)
O2 SAT: 88 %
O2 SAT: 91 %
O2 Saturation: 98 %
PH ART: 7.229 — AB (ref 7.350–7.450)
PO2 ART: 76 mmHg — AB (ref 83.0–108.0)
Patient temperature: 99.5
Patient temperature: 37.9
TCO2: 39 mmol/L — AB (ref 22–32)
TCO2: 38 mmol/L — AB (ref 22–32)
TCO2: 34 mmol/L — AB (ref 22–32)
pCO2 arterial: 64 mmHg — ABNORMAL HIGH (ref 32.0–48.0)
pCO2 arterial: 84.6 mmHg (ref 32.0–48.0)
pCO2 arterial: 70.9 mmHg (ref 32.0–48.0)
pH, Arterial: 7.376 (ref 7.350–7.450)
pH, Arterial: 7.263 — ABNORMAL LOW (ref 7.350–7.450)
pO2, Arterial: 58 mmHg — ABNORMAL LOW (ref 83.0–108.0)
pO2, Arterial: 144 mmHg — ABNORMAL HIGH (ref 83.0–108.0)

## 2017-01-11 LAB — GLUCOSE, CAPILLARY
GLUCOSE-CAPILLARY: 217 mg/dL — AB (ref 65–99)
GLUCOSE-CAPILLARY: 191 mg/dL — AB (ref 65–99)
Glucose-Capillary: 179 mg/dL — ABNORMAL HIGH (ref 65–99)
Glucose-Capillary: 233 mg/dL — ABNORMAL HIGH (ref 65–99)
Glucose-Capillary: 248 mg/dL — ABNORMAL HIGH (ref 65–99)
Glucose-Capillary: 269 mg/dL — ABNORMAL HIGH (ref 65–99)

## 2017-01-11 LAB — BASIC METABOLIC PANEL
ANION GAP: 4 — AB (ref 5–15)
BUN: 20 mg/dL (ref 6–20)
CO2: 33 mmol/L — ABNORMAL HIGH (ref 22–32)
Calcium: 8.4 mg/dL — ABNORMAL LOW (ref 8.9–10.3)
Chloride: 108 mmol/L (ref 101–111)
Creatinine, Ser: 0.71 mg/dL (ref 0.61–1.24)
Glucose, Bld: 202 mg/dL — ABNORMAL HIGH (ref 65–99)
POTASSIUM: 4.4 mmol/L (ref 3.5–5.1)
SODIUM: 145 mmol/L (ref 135–145)

## 2017-01-11 LAB — BLOOD GAS, ARTERIAL
ACID-BASE EXCESS: 6.2 mmol/L — AB (ref 0.0–2.0)
BICARBONATE: 31.6 mmol/L — AB (ref 20.0–28.0)
Drawn by: 518061
FIO2: 100
O2 Saturation: 99.3 %
PATIENT TEMPERATURE: 98.6
PCO2 ART: 58.2 mmHg — AB (ref 32.0–48.0)
PEEP/CPAP: 15 cmH2O
PIP: 35 cmH2O
PO2 ART: 229 mmHg — AB (ref 83.0–108.0)
RATE: 10 resp/min
pH, Arterial: 7.354 (ref 7.350–7.450)

## 2017-01-11 LAB — CBC
HCT: 27.9 % — ABNORMAL LOW (ref 39.0–52.0)
HEMOGLOBIN: 8.4 g/dL — AB (ref 13.0–17.0)
MCH: 29.3 pg (ref 26.0–34.0)
MCHC: 30.1 g/dL (ref 30.0–36.0)
MCV: 97.2 fL (ref 78.0–100.0)
PLATELETS: 334 10*3/uL (ref 150–400)
RBC: 2.87 MIL/uL — ABNORMAL LOW (ref 4.22–5.81)
RDW: 13.9 % (ref 11.5–15.5)
WBC: 10.1 10*3/uL (ref 4.0–10.5)

## 2017-01-11 LAB — HEMOGLOBIN A1C
HEMOGLOBIN A1C: 8.5 % — AB (ref 4.8–5.6)
MEAN PLASMA GLUCOSE: 197.25 mg/dL

## 2017-01-11 MED ORDER — FENTANYL CITRATE (PF) 2500 MCG/50ML IJ SOLN
25.0000 ug/h | INTRAMUSCULAR | Status: DC
  Administered 2017-01-11 – 2017-01-12 (×2): 400 ug/h via INTRAVENOUS
  Filled 2017-01-11 (×2): qty 100

## 2017-01-11 MED ORDER — CHLORHEXIDINE GLUCONATE 0.12 % MT SOLN
OROMUCOSAL | Status: AC
Start: 2017-01-11 — End: 2017-01-11
  Administered 2017-01-11: 15 mL
  Filled 2017-01-11: qty 15

## 2017-01-11 MED ORDER — INSULIN ASPART 100 UNIT/ML ~~LOC~~ SOLN
0.0000 [IU] | SUBCUTANEOUS | Status: DC
  Administered 2017-01-11: 11 [IU] via SUBCUTANEOUS
  Administered 2017-01-12 (×3): 7 [IU] via SUBCUTANEOUS
  Administered 2017-01-12: 15 [IU] via SUBCUTANEOUS
  Administered 2017-01-12: 20 [IU] via SUBCUTANEOUS
  Administered 2017-01-12: 3 [IU] via SUBCUTANEOUS
  Administered 2017-01-12: 20 [IU] via SUBCUTANEOUS
  Administered 2017-01-13: 11 [IU] via SUBCUTANEOUS
  Administered 2017-01-13: 7 [IU] via SUBCUTANEOUS
  Administered 2017-01-13: 11 [IU] via SUBCUTANEOUS
  Administered 2017-01-13 (×2): 15 [IU] via SUBCUTANEOUS
  Administered 2017-01-13: 11 [IU] via SUBCUTANEOUS
  Administered 2017-01-14 (×3): 15 [IU] via SUBCUTANEOUS
  Administered 2017-01-14: 11 [IU] via SUBCUTANEOUS
  Administered 2017-01-14 – 2017-01-15 (×5): 7 [IU] via SUBCUTANEOUS
  Administered 2017-01-15 (×2): 11 [IU] via SUBCUTANEOUS
  Administered 2017-01-16 (×2): 4 [IU] via SUBCUTANEOUS
  Administered 2017-01-16: 11 [IU] via SUBCUTANEOUS
  Administered 2017-01-16 (×2): 4 [IU] via SUBCUTANEOUS
  Administered 2017-01-16 – 2017-01-17 (×2): 11 [IU] via SUBCUTANEOUS
  Administered 2017-01-17: 7 [IU] via SUBCUTANEOUS
  Administered 2017-01-17 (×2): 4 [IU] via SUBCUTANEOUS
  Administered 2017-01-18: 7 [IU] via SUBCUTANEOUS
  Administered 2017-01-18 (×2): 4 [IU] via SUBCUTANEOUS
  Administered 2017-01-18 (×2): 7 [IU] via SUBCUTANEOUS
  Administered 2017-01-18 – 2017-01-19 (×3): 4 [IU] via SUBCUTANEOUS
  Administered 2017-01-19: 7 [IU] via SUBCUTANEOUS
  Administered 2017-01-19 (×2): 4 [IU] via SUBCUTANEOUS
  Administered 2017-01-20 (×2): 3 [IU] via SUBCUTANEOUS
  Administered 2017-01-20 – 2017-01-21 (×2): 4 [IU] via SUBCUTANEOUS
  Administered 2017-01-21: 3 [IU] via SUBCUTANEOUS
  Administered 2017-01-21: 2 [IU] via SUBCUTANEOUS
  Administered 2017-01-21: 11 [IU] via SUBCUTANEOUS
  Administered 2017-01-21: 3 [IU] via SUBCUTANEOUS
  Administered 2017-01-22: 7 [IU] via SUBCUTANEOUS
  Administered 2017-01-22 (×2): 4 [IU] via SUBCUTANEOUS
  Administered 2017-01-23: 3 [IU] via SUBCUTANEOUS
  Administered 2017-01-23: 4 [IU] via SUBCUTANEOUS
  Administered 2017-01-23: 3 [IU] via SUBCUTANEOUS
  Administered 2017-01-23: 4 [IU] via SUBCUTANEOUS
  Administered 2017-01-23 – 2017-01-24 (×2): 3 [IU] via SUBCUTANEOUS
  Administered 2017-01-24 (×2): 4 [IU] via SUBCUTANEOUS

## 2017-01-11 MED ORDER — INSULIN ASPART 100 UNIT/ML ~~LOC~~ SOLN: 0.0000 [IU] | SUBCUTANEOUS | Status: DC

## 2017-01-11 MED ORDER — FUROSEMIDE 10 MG/ML IJ SOLN
20.0000 mg | Freq: Four times a day (QID) | INTRAMUSCULAR | Status: AC
  Administered 2017-01-11 (×2): 20 mg via INTRAVENOUS
  Filled 2017-01-11 (×2): qty 2

## 2017-01-11 MED ORDER — SODIUM CHLORIDE 0.9 % IV SOLN
INTRAVENOUS | Status: DC
  Administered 2017-01-11: 2.6 [IU]/h via INTRAVENOUS
  Filled 2017-01-11: qty 1

## 2017-01-11 MED ORDER — MIDAZOLAM HCL 2 MG/2ML IJ SOLN
1.0000 mg | INTRAMUSCULAR | Status: DC | PRN
  Administered 2017-01-12 (×2): 4 mg via INTRAVENOUS

## 2017-01-11 MED ORDER — INSULIN NPH (HUMAN) (ISOPHANE) 100 UNIT/ML ~~LOC~~ SUSP: 10.0000 [IU] | Freq: Once | SUBCUTANEOUS | Status: DC

## 2017-01-11 MED ORDER — INSULIN GLARGINE 100 UNIT/ML ~~LOC~~ SOLN: 40.0000 [IU] | Freq: Every day | SUBCUTANEOUS | Status: DC

## 2017-01-11 MED ORDER — INSULIN NPH (HUMAN) (ISOPHANE) 100 UNIT/ML ~~LOC~~ SUSP
10.0000 [IU] | Freq: Once | SUBCUTANEOUS | Status: AC
  Administered 2017-01-11: 10 [IU] via SUBCUTANEOUS
  Filled 2017-01-11: qty 10

## 2017-01-11 MED ORDER — SODIUM CHLORIDE 0.9 % IV SOLN
2.0000 mg/h | INTRAVENOUS | Status: DC
  Administered 2017-01-11 – 2017-01-12 (×2): 10 mg/h via INTRAVENOUS
  Filled 2017-01-11 (×3): qty 20

## 2017-01-11 MED ORDER — DEXMEDETOMIDINE HCL IN NACL 200 MCG/50ML IV SOLN
0.4000 ug/kg/h | INTRAVENOUS | Status: DC
  Administered 2017-01-11 – 2017-01-12 (×3): 0.5 ug/kg/h via INTRAVENOUS
  Administered 2017-01-12: 0.8 ug/kg/h via INTRAVENOUS
  Filled 2017-01-11 (×5): qty 50

## 2017-01-11 MED ORDER — INSULIN GLARGINE 100 UNIT/ML ~~LOC~~ SOLN
40.0000 [IU] | Freq: Every day | SUBCUTANEOUS | Status: DC
  Administered 2017-01-12: 40 [IU] via SUBCUTANEOUS
  Filled 2017-01-11 (×2): qty 0.4

## 2017-01-11 MED ORDER — SODIUM CHLORIDE 0.9 % IV SOLN: INTRAVENOUS | Status: DC | PRN

## 2017-01-11 NOTE — Progress Notes (Addendum)
eLink Physician-Brief Progress Note Patient Name: Mark LoseVernell Dimmer Jr. DOB: 08/07/1980 MRN: [id number]   Date of Service  01/11/2017  HPI/Events of Note  Persistent hypoxemia with sats 70s-80s Insp increased to 32,  T high increased to 7, T low reduced to 0.5 to recruit the lung. But sats did not improve [facility], recruitment maneuvers 6cc/kg tv  eICU Interventions  Follow ABG, CXR A line placement     Intervention Category Major Interventions: Hypoxemia - evaluation and management  Dnyla Antonetti 01/11/2017, 2:44 AM
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
## 2017-01-11 NOTE — Progress Notes (Addendum)
eLink Physician-Brief Progress Note Patient Name: Mark LoseVernell Burleson Jr. DOB: 03/03/1981 MRN: [id number]   Date of Service  01/11/2017  HPI/Events of Note  ABG reviewed 7.23/85/144  eICU Interventions  T high reduced to from 5 to 4 to increase minute ventilation Follow ABG     Intervention Category Major Interventions: Hypoxemia - evaluation and management  Leesa Leifheit 01/11/2017, 5:38 AM

## 2017-01-11 NOTE — Procedures (Signed)
Arterial Catheter Insertion Procedure Note Nicholas LoseVernell Fowles Jr. [id number] 04/20/1980  Procedure: Insertion of Arterial Catheter  Indications: Blood pressure monitoring  Procedure Details Consent: Risks of procedure as well as the alternatives and risks of each were explained to the (patient/caregiver).  Consent for procedure obtained. Time Out: Verified patient identification, verified procedure, site/side was marked, verified correct patient position, special equipment/implants available, medications/allergies/relevent history reviewed, required imaging and test results available.  Performed  Maximum sterile technique was used including cap. Skin prep: Chlorhexidine; local anesthetic administered 20 gauge catheter was inserted into left radial artery using the Seldinger technique.  Evaluation Blood flow good; BP tracing good. Complications: No apparent complications.   Ronny FlurryMorgan B Artemio Dobie 01/11/2017
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
## 2017-01-11 NOTE — Progress Notes (Signed)
Pt sat sustained in high 70s low 80s. No care being done when desaturation began. [facility] into room. FiO2 turned to 100%.

## 2017-01-11 NOTE — Progress Notes (Signed)
Aline placed per MD order, MD aware only site to use was left arm which pt also has PIC line.  RN verified MD notified.  RT will continue to monitor.

## 2017-01-11 NOTE — Progress Notes (Signed)
MD Called with critical values on ABG. 7.23/85/144/35 98%

## 2017-01-11 NOTE — Progress Notes (Signed)
Dr. Jamison NeighborNestor notified of ABG results on (01/11/2017 at 17:51) currents settings of Bivent.

## 2017-01-11 NOTE — Progress Notes (Signed)
Communicated via eLink with Dr. Isaiah SergeMannam regarding pt SPO2 dropping significantly from mid to upper 90'2 to low's 70's and remaining low for approximately 5 minutes even after suctioning with no return, increasing paralytic back to max amount 1.7 mck/kg. Pt remains on highest dosages of Fentanyl and Versed. Dr. Isaiah SergeMannam spoke with nursing and respiratory. No further nursing changes. Changes to ventilator made per respiratory therapist. Will continue to monitor.

## 2017-01-11 NOTE — Progress Notes (Signed)
PCCM update   Pt seen at 3:30 AM Had an episode desaturation on APRV and [facility] showed de-recruitment of the right lung with mediastinal shift. Discussed with care providers including ELink, RN, and RT Switched patient back to APRV with new settings P high 35 P low 15 T high 5 T low 1 O2 sats went from 88% to 98% pt tolerating Hemodynamically stable Will obtain an ABG in 1 hr post vent changes.  Signed Dr Newell CoralKristen Scatliffe Pulmonary Critical Care Locums

## 2017-01-12 ENCOUNTER — Inpatient Hospital Stay (HOSPITAL_COMMUNITY): Payer: Self-pay

## 2017-01-12 DIAGNOSIS — J9601 Acute respiratory failure with hypoxia: Secondary | ICD-10-CM

## 2017-01-12 DIAGNOSIS — D649 Anemia, unspecified: Secondary | ICD-10-CM

## 2017-01-12 DIAGNOSIS — Z789 Other specified health status: Secondary | ICD-10-CM

## 2017-01-12 LAB — CBC WITH DIFFERENTIAL/PLATELET
BASOS PCT: 0 %
Basophils Absolute: 0 10*3/uL (ref 0.0–0.1)
Eosinophils Absolute: 0.1 10*3/uL (ref 0.0–0.7)
Eosinophils Relative: 1 %
HEMATOCRIT: 25.8 % — AB (ref 39.0–52.0)
HEMOGLOBIN: 7.8 g/dL — AB (ref 13.0–17.0)
LYMPHS ABS: 2.5 10*3/uL (ref 0.7–4.0)
Lymphocytes Relative: 22 %
MCH: 28.7 pg (ref 26.0–34.0)
MCHC: 30.2 g/dL (ref 30.0–36.0)
MCV: 94.9 fL (ref 78.0–100.0)
MONO ABS: 0.8 10*3/uL (ref 0.1–1.0)
MONOS PCT: 7 %
NEUTROS ABS: 7.8 10*3/uL — AB (ref 1.7–7.7)
NEUTROS PCT: 70 %
Platelets: 344 10*3/uL (ref 150–400)
RBC: 2.72 MIL/uL — ABNORMAL LOW (ref 4.22–5.81)
RDW: 13.6 % (ref 11.5–15.5)
WBC: 11.3 10*3/uL — ABNORMAL HIGH (ref 4.0–10.5)

## 2017-01-12 LAB — GLUCOSE, CAPILLARY
GLUCOSE-CAPILLARY: 204 mg/dL — AB (ref 65–99)
GLUCOSE-CAPILLARY: 372 mg/dL — AB (ref 65–99)
Glucose-Capillary: 229 mg/dL — ABNORMAL HIGH (ref 65–99)
Glucose-Capillary: 223 mg/dL — ABNORMAL HIGH (ref 65–99)
Glucose-Capillary: 150 mg/dL — ABNORMAL HIGH (ref 65–99)
Glucose-Capillary: 333 mg/dL — ABNORMAL HIGH (ref 65–99)
Glucose-Capillary: 328 mg/dL — ABNORMAL HIGH (ref 65–99)

## 2017-01-12 LAB — BLOOD GAS, ARTERIAL
Acid-Base Excess: 7.3 mmol/L — ABNORMAL HIGH (ref 0.0–2.0)
Acid-Base Excess: 7.9 mmol/L — ABNORMAL HIGH (ref 0.0–2.0)
BICARBONATE: 32.1 mmol/L — AB (ref 20.0–28.0)
BICARBONATE: 30 mmol/L — AB (ref 20.0–28.0)
DRAWN BY: 518061
Drawn by: 28340
FIO2: 70
FIO2: 100
O2 SAT: 98.9 %
O2 Saturation: 99.8 %
PATIENT TEMPERATURE: 100
PCO2 ART: 55.5 mmHg — AB (ref 32.0–48.0)
PEEP/CPAP: 14 cmH2O
PO2 ART: 163 mmHg — AB (ref 83.0–108.0)
PRESSURE CONTROL: 30 cmH2O
Patient temperature: 98.6
RATE: 28 resp/min
pCO2 arterial: 27.3 mmHg — ABNORMAL LOW (ref 32.0–48.0)
pH, Arterial: 7.384 (ref 7.350–7.450)
pH, Arterial: 7.642 (ref 7.350–7.450)
pO2, Arterial: 343 mmHg — ABNORMAL HIGH (ref 83.0–108.0)

## 2017-01-12 LAB — BASIC METABOLIC PANEL
Anion gap: 8 (ref 5–15)
BUN: 24 mg/dL — ABNORMAL HIGH (ref 6–20)
CHLORIDE: 107 mmol/L (ref 101–111)
CO2: 32 mmol/L (ref 22–32)
CREATININE: 0.73 mg/dL (ref 0.61–1.24)
Calcium: 8.7 mg/dL — ABNORMAL LOW (ref 8.9–10.3)
GFR calc non Af Amer: >60 mL/min (ref >60)
GLUCOSE: 218 mg/dL — AB (ref 65–99)
Potassium: 4.1 mmol/L (ref 3.5–5.1)
Sodium: 147 mmol/L — ABNORMAL HIGH (ref 135–145)

## 2017-01-12 LAB — POCT I-STAT 3, ART BLOOD GAS (G3+)
ACID-BASE EXCESS: 16 mmol/L — AB (ref 0.0–2.0)
Acid-Base Excess: 7 mmol/L — ABNORMAL HIGH (ref 0.0–2.0)
BICARBONATE: 36.8 mmol/L — AB (ref 20.0–28.0)
Bicarbonate: 40.3 mmol/L — ABNORMAL HIGH (ref 20.0–28.0)
O2 SAT: 100 %
O2 Saturation: 97 %
PH ART: 7.522 — AB (ref 7.350–7.450)
PO2 ART: 234 mmHg — AB (ref 83.0–108.0)
Patient temperature: 38.3
Patient temperature: 38.6
TCO2: 39 mmol/L — AB (ref 22–32)
TCO2: 42 mmol/L — AB (ref 22–32)
pCO2 arterial: 49.7 mmHg — ABNORMAL HIGH (ref 32.0–48.0)
pH, Arterial: 7.216 — ABNORMAL LOW (ref 7.350–7.450)
pO2, Arterial: 126 mmHg — ABNORMAL HIGH (ref 83.0–108.0)

## 2017-01-12 LAB — CULTURE, RESPIRATORY

## 2017-01-12 LAB — CULTURE, RESPIRATORY W GRAM STAIN
Culture: NO GROWTH
Special Requests: NORMAL

## 2017-01-12 LAB — PHOSPHORUS: PHOSPHORUS: 3.1 mg/dL (ref 2.5–4.6)

## 2017-01-12 LAB — MAGNESIUM: Magnesium: 2.1 mg/dL (ref 1.7–2.4)

## 2017-01-12 MED ORDER — METHYLPREDNISOLONE SODIUM SUCC 125 MG IJ SOLR
INTRAMUSCULAR | Status: AC
  Filled 2017-01-12: qty 2

## 2017-01-12 MED ORDER — MAGNESIUM SULFATE 2 GM/50ML IV SOLN
INTRAVENOUS | Status: AC
  Filled 2017-01-12: qty 50

## 2017-01-12 MED ORDER — METHYLPREDNISOLONE SODIUM SUCC 125 MG IJ SOLR
80.0000 mg | Freq: Four times a day (QID) | INTRAMUSCULAR | Status: DC
  Administered 2017-01-12 – 2017-01-13 (×5): 80 mg via INTRAVENOUS
  Filled 2017-01-12 (×4): qty 2

## 2017-01-12 MED ORDER — SODIUM CHLORIDE 0.9 % IV SOLN
0.5000 mg/h | INTRAVENOUS | Status: DC
  Administered 2017-01-12 – 2017-01-14 (×3): 2 mg/h via INTRAVENOUS
  Administered 2017-01-15: 4 mg/h via INTRAVENOUS
  Administered 2017-01-15: 3 mg/h via INTRAVENOUS
  Administered 2017-01-16: 5 mg/h via INTRAVENOUS
  Administered 2017-01-16: 3 mg/h via INTRAVENOUS
  Administered 2017-01-17: 2 mg/h via INTRAVENOUS
  Administered 2017-01-18: 4 mg/h via INTRAVENOUS
  Administered 2017-01-18: 2 mg/h via INTRAVENOUS
  Administered 2017-01-19: 4 mg/h via INTRAVENOUS
  Filled 2017-01-12 (×11): qty 5

## 2017-01-12 MED ORDER — SODIUM CHLORIDE 0.9 % IV SOLN
2.0000 mg/h | INTRAVENOUS | Status: DC
  Administered 2017-01-12: 10 mg/h via INTRAVENOUS
  Administered 2017-01-13 (×2): 9 mg/h via INTRAVENOUS
  Administered 2017-01-14: 6 mg/h via INTRAVENOUS
  Filled 2017-01-12 (×4): qty 20

## 2017-01-12 MED ORDER — MIDAZOLAM HCL 2 MG/2ML IJ SOLN: 2.0000 mg | Freq: Once | INTRAMUSCULAR | Status: DC | PRN

## 2017-01-12 MED ORDER — SODIUM BICARBONATE 8.4 % IV SOLN
INTRAVENOUS | Status: AC
  Filled 2017-01-12: qty 100

## 2017-01-12 MED ORDER — ALBUTEROL SULFATE (2.5 MG/3ML) 0.083% IN NEBU
INHALATION_SOLUTION | RESPIRATORY_TRACT | Status: AC
  Filled 2017-01-12: qty 3

## 2017-01-12 MED ORDER — FENTANYL BOLUS VIA INFUSION
50.0000 ug | INTRAVENOUS | Status: DC | PRN
  Filled 2017-01-12: qty 50

## 2017-01-12 MED ORDER — ALBUTEROL SULFATE (2.5 MG/3ML) 0.083% IN NEBU
2.5000 mg | INHALATION_SOLUTION | RESPIRATORY_TRACT | Status: DC | PRN
  Administered 2017-01-12 (×4): 2.5 mg via RESPIRATORY_TRACT
  Filled 2017-01-12 (×4): qty 3

## 2017-01-12 MED ORDER — MIDAZOLAM BOLUS VIA INFUSION
2.0000 mg | INTRAVENOUS | Status: DC | PRN
  Filled 2017-01-12: qty 2

## 2017-01-12 MED ORDER — SODIUM BICARBONATE 8.4 % IV SOLN
100.0000 meq | Freq: Once | INTRAVENOUS | Status: AC
  Administered 2017-01-12: 50 meq via INTRAVENOUS

## 2017-01-12 MED ORDER — MAGNESIUM SULFATE 2 GM/50ML IV SOLN
2.0000 g | Freq: Once | INTRAVENOUS | Status: AC
  Administered 2017-01-12: 2 g via INTRAVENOUS

## 2017-01-12 MED ORDER — IPRATROPIUM-ALBUTEROL 0.5-2.5 (3) MG/3ML IN SOLN
RESPIRATORY_TRACT | Status: AC
  Administered 2017-01-12: 3 mL via RESPIRATORY_TRACT
  Filled 2017-01-12: qty 3

## 2017-01-12 MED ORDER — ROCURONIUM BROMIDE 50 MG/5ML IV SOLN
100.0000 mg | Freq: Once | INTRAVENOUS | Status: AC
  Administered 2017-01-12: 100 mg via INTRAVENOUS

## 2017-01-12 MED ORDER — FENTANYL CITRATE (PF) 100 MCG/2ML IJ SOLN
50.0000 ug | INTRAMUSCULAR | Status: DC | PRN
  Administered 2017-01-16 – 2017-01-21 (×3): 50 ug via INTRAVENOUS
  Filled 2017-01-12 (×3): qty 2

## 2017-01-12 MED ORDER — IPRATROPIUM-ALBUTEROL 0.5-2.5 (3) MG/3ML IN SOLN
3.0000 mL | RESPIRATORY_TRACT | Status: DC
  Administered 2017-01-12 – 2017-01-20 (×51): 3 mL via RESPIRATORY_TRACT
  Filled 2017-01-12 (×50): qty 3

## 2017-01-12 MED ORDER — ACETYLCYSTEINE 20 % IN SOLN
4.0000 mL | Freq: Four times a day (QID) | RESPIRATORY_TRACT | Status: AC
  Administered 2017-01-12 – 2017-01-13 (×5): 4 mL via RESPIRATORY_TRACT
  Filled 2017-01-12 (×5): qty 4

## 2017-01-12 MED ORDER — METHYLPREDNISOLONE SODIUM SUCC 125 MG IJ SOLR: 80.0000 mg | Freq: Once | INTRAMUSCULAR | Status: DC

## 2017-01-12 MED ORDER — ACETYLCYSTEINE 20 % IN SOLN
4.0000 mL | Freq: Four times a day (QID) | RESPIRATORY_TRACT | Status: DC
  Filled 2017-01-12: qty 4

## 2017-01-12 MED ORDER — MIDAZOLAM HCL 2 MG/2ML IJ SOLN: 2.0000 mg | Freq: Once | INTRAMUSCULAR | Status: DC

## 2017-01-12 MED ORDER — DEXMEDETOMIDINE HCL IN NACL 400 MCG/100ML IV SOLN
0.4000 ug/kg/h | INTRAVENOUS | Status: DC
  Administered 2017-01-12: 1.2 ug/kg/h via INTRAVENOUS
  Filled 2017-01-12: qty 200

## 2017-01-12 MED ORDER — SODIUM CHLORIDE 0.9 % IV SOLN
3.0000 ug/kg/min | INTRAVENOUS | Status: DC
  Administered 2017-01-12: 4 ug/kg/min via INTRAVENOUS
  Administered 2017-01-12: 3 ug/kg/min via INTRAVENOUS
  Administered 2017-01-13 (×3): 3.5 ug/kg/min via INTRAVENOUS
  Filled 2017-01-12 (×7): qty 20

## 2017-01-12 MED ORDER — HYDROMORPHONE BOLUS VIA INFUSION
1.0000 mg | INTRAVENOUS | Status: DC | PRN
  Administered 2017-01-13 – 2017-01-14 (×2): 1 mg via INTRAVENOUS
  Administered 2017-01-14 (×2): 2 mg via INTRAVENOUS
  Filled 2017-01-12: qty 4

## 2017-01-12 MED ORDER — FUROSEMIDE 10 MG/ML IJ SOLN
40.0000 mg | Freq: Three times a day (TID) | INTRAMUSCULAR | Status: DC
  Administered 2017-01-12 – 2017-01-14 (×6): 40 mg via INTRAVENOUS
  Filled 2017-01-12 (×6): qty 4

## 2017-01-12 MED ORDER — FUROSEMIDE 10 MG/ML IJ SOLN
INTRAMUSCULAR | Status: AC
  Administered 2017-01-12: 80 mg
  Filled 2017-01-12: qty 8

## 2017-01-12 MED ORDER — CISATRACURIUM BOLUS VIA INFUSION
0.1000 mg/kg | Freq: Once | INTRAVENOUS | Status: AC
  Administered 2017-01-12: 10.7 mg via INTRAVENOUS
  Filled 2017-01-12: qty 11

## 2017-01-12 MED ORDER — ARTIFICIAL TEARS OPHTHALMIC OINT: 1.0000 "application " | TOPICAL_OINTMENT | Freq: Three times a day (TID) | OPHTHALMIC | Status: DC

## 2017-01-12 MED ORDER — SODIUM BICARBONATE 8.4 % IV SOLN
50.0000 meq | Freq: Once | INTRAVENOUS | Status: AC
  Administered 2017-01-12: 50 meq via INTRAVENOUS

## 2017-01-12 MED ORDER — SODIUM CHLORIDE 0.9 % IV SOLN
2.0000 mg/h | INTRAVENOUS | Status: DC
  Administered 2017-01-12: 10 mg/h via INTRAVENOUS
  Filled 2017-01-12 (×2): qty 10

## 2017-01-12 NOTE — Progress Notes (Signed)
50cc fent gtt ([id number]/100ml) wasted in sink. Witnessed by M. Lara MulchBergmann, RN

## 2017-01-12 NOTE — Progress Notes (Signed)
Pt Sp02 dropped to 44%. I started bagging the pt with a peep valve. Pt's Sp02 slowly increased to 94%. Dr Marchelle Gearingamaswamy at bedside. Increased peep to 14 per MD. Pt's mother at bedside now.

## 2017-01-12 NOTE — Progress Notes (Signed)
Notified Dr. Marchelle Gearingamaswamy of ABG results- instructed to inform RT to not adjust PEEP, only FIO2. Can decrease FIO2 to 80 if pt tolerates. RT notified of instructions.

## 2017-01-12 NOTE — Assessment & Plan Note (Signed)
Hold TF given resp issues

## 2017-01-12 NOTE — Progress Notes (Signed)
While receiving report, pt noted to having increased work of breathing, RR 30-40, SBP up to 200s. Esmolol gtt restarted. MD notified.  RT called. Roc and etomidate at bedside as requested for MD upon arrival.

## 2017-01-12 NOTE — Assessment & Plan Note (Signed)
Recent Labs  Lab 01/08/17 0500 01/09/17 0455 01/10/17 0342 01/11/17 0439 01/12/17 0500  HGB 7.7* 8.0* 7.3* 8.4* 7.8*    - PRBC for hgb </= 6.9gm%    - exceptions are   -  if ACS susepcted/confirmed then transfuse for hgb </= 8.0gm%,  or    -  active bleeding with hemodynamic instability, then transfuse regardless of hemoglobin value   At at all times try to transfuse 1 unit prbc as possible with exception of active hemorrhage
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
## 2017-01-12 NOTE — Progress Notes (Signed)
Pt was bagged with peep valve due to not ventilating on ventilator. Pt was extremely hard to bag. Endotracheal tube was repositioned per Dr Marchelle Gearingamaswamy to 24 @ lip. Pt became easier to ventilate & placed back on the vent. Pt had a bronchoscopy by Dr Marchelle Gearingamaswamy to assure no blockage in ET tube. Pt remained stable throughout the procedure.

## 2017-01-12 NOTE — Assessment & Plan Note (Addendum)
Significant vent dysnchrony. Having rpeated Low Ve esp when RR reduced to 12-15 from hihg 20s. OTherwise hitting high PiP. ? Paralyzed diaphargam, ? Asthma/copd, ? pulm congestion . Bronch shows clear et tube and cxr without pneumothothorax  ? Rapid metabolizer of rocuronium - vent did not take over when paralyze  Failing APRV and PRVC and changed to PCV  Consider paralyzed diaphragm as well He might have asthma/copd based on wheeze, smoking hx and improvement with neb  Plan PCV Rotate fent gtt to dilaudid gtt Dc precedex Continue versed gtt STart nimbex gtt BIS score < 60w with RASS -4 (currently RASS -4 pre-rocuronium) Mag sulfate and bicarb bolu sstat (given)  Others IV steroids start Scheduled nebs and stat neb Add mucomyst for secretions RLL Add lasix for secretions and possible pulm edema given high bp Continue abx Serial ABG

## 2017-01-13 LAB — POCT I-STAT 3, ART BLOOD GAS (G3+)
ACID-BASE EXCESS: 7 mmol/L — AB (ref 0.0–2.0)
Acid-Base Excess: 8 mmol/L — ABNORMAL HIGH (ref 0.0–2.0)
BICARBONATE: 28.6 mmol/L — AB (ref 20.0–28.0)
BICARBONATE: 31.6 mmol/L — AB (ref 20.0–28.0)
O2 SAT: 100 %
O2 Saturation: 99 %
PO2 ART: 117 mmHg — AB (ref 83.0–108.0)
Patient temperature: 37.7
TCO2: 29 mmol/L (ref 22–32)
TCO2: 33 mmol/L — AB (ref 22–32)
pCO2 arterial: 23.8 mmHg — ABNORMAL LOW (ref 32.0–48.0)
pCO2 arterial: 47.1 mmHg (ref 32.0–48.0)
pH, Arterial: 7.689 (ref 7.350–7.450)
pH, Arterial: 7.439 (ref 7.350–7.450)
pO2, Arterial: 148 mmHg — ABNORMAL HIGH (ref 83.0–108.0)

## 2017-01-13 LAB — CULTURE, BLOOD (ROUTINE X 2)
Culture: NO GROWTH
Special Requests: ADEQUATE

## 2017-01-13 LAB — GLUCOSE, CAPILLARY
GLUCOSE-CAPILLARY: 315 mg/dL — AB (ref 65–99)
GLUCOSE-CAPILLARY: 254 mg/dL — AB (ref 65–99)
Glucose-Capillary: 314 mg/dL — ABNORMAL HIGH (ref 65–99)
Glucose-Capillary: 294 mg/dL — ABNORMAL HIGH (ref 65–99)
Glucose-Capillary: 277 mg/dL — ABNORMAL HIGH (ref 65–99)

## 2017-01-13 MED ORDER — METHYLPREDNISOLONE SODIUM SUCC 40 MG IJ SOLR
40.0000 mg | Freq: Four times a day (QID) | INTRAMUSCULAR | Status: DC
  Administered 2017-01-13 – 2017-01-14 (×4): 40 mg via INTRAVENOUS
  Filled 2017-01-13 (×4): qty 1

## 2017-01-13 MED ORDER — GLUCERNA 1.2 CAL PO LIQD
1000.0000 mL | ORAL | Status: DC
  Administered 2017-01-13 – 2017-01-14 (×2): 1000 mL
  Filled 2017-01-13 (×3): qty 1000

## 2017-01-13 MED ORDER — INSULIN GLARGINE 100 UNIT/ML ~~LOC~~ SOLN
40.0000 [IU] | Freq: Two times a day (BID) | SUBCUTANEOUS | Status: DC
  Administered 2017-01-13: 40 [IU] via SUBCUTANEOUS
  Filled 2017-01-13 (×2): qty 0.4
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
## 2017-01-13 NOTE — Progress Notes (Signed)
Follow up - Trauma and Critical Care  Patient Details:    Mark Mcguire. is an 36 y.o. male.  Lines/tubes : Airway 7.5 mm (Active)  Secured at (cm) 24 cm 01/13/2017  8:08 AM  Measured From Lips 01/13/2017  8:08 AM  Secured Location Center 01/13/2017  8:08 AM  Secured By [employer] 01/13/2017  8:08 AM  Tube Holder Repositioned Yes 01/13/2017  8:08 AM  Cuff Pressure (cm H2O) 28 cm H2O 01/12/2017  7:45 AM  Site Condition Dry 01/13/2017  8:08 AM     PICC Triple Lumen 01/07/17 PICC Left Basilic 48 cm 3 cm (Active)  Indication for Insertion or Continuance of Line Prolonged intravenous therapies;Limited venous access - need for IV therapy >5 days (PICC only) 01/12/2017  8:00 PM  Exposed Catheter (cm) 3 cm 01/09/2017  8:00 AM  Site Assessment Clean;Dry;Intact 01/12/2017  8:00 PM  Lumen #1 Status Infusing 01/12/2017  8:00 PM  Lumen #2 Status Infusing 01/12/2017  8:00 PM  Lumen #3 Status Infusing 01/12/2017  8:00 PM  Dressing Type Transparent;Occlusive 01/12/2017  8:00 PM  Dressing Status Clean;Intact;Dry;Antimicrobial disc in place 01/12/2017  8:00 PM  Line Care Connections checked and tightened;Zeroed and calibrated 01/12/2017  8:00 PM  Dressing Intervention Dressing changed;Antimicrobial disc changed 01/08/2017  3:30 PM  Dressing Change Due 01/15/17 01/12/2017  8:00 PM     NG/OG Tube Orogastric Center mouth Xray (Active)  Site Assessment Clean;Dry;Intact 01/12/2017  8:00 PM  Ongoing Placement Verification No acute changes, not attributed to clinical condition;No change in respiratory status 01/12/2017  8:00 PM  Status Clamped;Irrigated 01/12/2017  8:00 PM  Drainage Appearance Bile 01/10/2017  4:00 AM  Intake (mL) 250 mL 01/09/2017 10:05 PM  Output (mL) 300 mL 01/09/2017  6:00 AM     Urethral Catheter A. Revonda Standard RN Temperature probe 16 Fr. (Active)  Indication for Insertion or Continuance of Catheter Acute urinary retention 01/12/2017  8:00 PM  Site Assessment Clean;Intact  01/12/2017  8:00 PM  Catheter Maintenance Bag below level of bladder;Catheter secured;Drainage bag/tubing not touching floor;Insertion date on drainage bag;No dependent loops;Seal intact 01/12/2017  8:00 PM  Collection Container Standard drainage bag 01/12/2017  8:00 PM  Securement Method Securing device (Describe) 01/12/2017  8:00 PM  Urinary Catheter Interventions Unclamped 01/12/2017 11:00 AM  Output (mL) 725 mL 01/13/2017  6:29 AM    Microbiology/Sepsis markers: Results for orders placed or performed during the hospital encounter of 01/03/17  MRSA PCR Screening     Status: None   Collection Time: 01/03/17  2:43 PM  Result Value Ref Range Status   MRSA by PCR NEGATIVE NEGATIVE Final    Comment:        The GeneXpert MRSA Assay (FDA approved for NASAL specimens only), is one component of a comprehensive MRSA colonization surveillance program. It is not intended to diagnose MRSA infection nor to guide or monitor treatment for MRSA infections.   Surgical pcr screen     Status: None   Collection Time: 01/06/17  5:15 AM  Result Value Ref Range Status   MRSA, PCR NEGATIVE NEGATIVE Final   Staphylococcus aureus NEGATIVE NEGATIVE Final    Comment: (NOTE) The Xpert SA Assay (FDA approved for NASAL specimens in patients 66 years of age and older), is one component of a comprehensive surveillance program. It is not intended to diagnose infection nor to guide or monitor treatment. Performed at [facility], [address] [place], [place] [address]   Culture, blood (Routine X  2) w Reflex to ID Panel     Status: Abnormal   Collection Time: 01/08/17  6:21 AM  Result Value Ref Range Status   Specimen Description BLOOD LEFT FOOT  Final   Special Requests IN PEDIATRIC BOTTLE Blood Culture adequate volume  Final   Culture  Setup Time   Final    GRAM POSITIVE COCCI IN CLUSTERS IN PEDIATRIC BOTTLE CRITICAL RESULT CALLED TO, READ BACK BY AND VERIFIED WITH: Veronda Prude.D. 9:55 01/09/18 ([email or web address])    Culture (A)  Final    STAPHYLOCOCCUS SPECIES (COAGULASE NEGATIVE) THE SIGNIFICANCE OF ISOLATING THIS ORGANISM FROM A SINGLE SET OF BLOOD CULTURES WHEN MULTIPLE SETS ARE DRAWN IS UNCERTAIN. PLEASE NOTIFY THE MICROBIOLOGY DEPARTMENT WITHIN ONE WEEK IF SPECIATION AND SENSITIVITIES ARE REQUIRED.    Report Status 01/10/2017 FINAL  Final  Blood Culture ID Panel (Reflexed)     Status: Abnormal   Collection Time: 01/08/17  6:21 AM  Result Value Ref Range Status   Enterococcus species NOT DETECTED NOT DETECTED Final   Listeria monocytogenes NOT DETECTED NOT DETECTED Final   Staphylococcus species DETECTED (A) NOT DETECTED Final    Comment: Methicillin (oxacillin) susceptible coagulase negative staphylococcus. Possible blood culture contaminant (unless isolated from more than one blood culture draw or clinical case suggests pathogenicity). No antibiotic treatment is indicated for blood  culture contaminants. CRITICAL RESULT CALLED TO, READ BACK BY AND VERIFIED WITH: Veronda Prude.D. 9:55 01/09/17 ([email or web address])    Staphylococcus aureus NOT DETECTED NOT DETECTED Final   Methicillin resistance NOT DETECTED NOT DETECTED Final   Streptococcus species NOT DETECTED NOT DETECTED Final   Streptococcus agalactiae NOT DETECTED NOT DETECTED Final   Streptococcus pneumoniae NOT DETECTED NOT DETECTED Final   Streptococcus pyogenes NOT DETECTED NOT DETECTED Final   Acinetobacter baumannii NOT DETECTED NOT DETECTED Final   Enterobacteriaceae species NOT DETECTED NOT DETECTED Final   Enterobacter cloacae complex NOT DETECTED NOT DETECTED Final   Escherichia coli NOT DETECTED NOT DETECTED Final   Klebsiella oxytoca NOT DETECTED NOT DETECTED Final   Klebsiella pneumoniae NOT DETECTED NOT DETECTED Final   Proteus species NOT DETECTED NOT DETECTED Final   Serratia marcescens NOT DETECTED NOT DETECTED Final   Haemophilus influenzae NOT DETECTED NOT DETECTED Final   Neisseria  meningitidis NOT DETECTED NOT DETECTED Final   Pseudomonas aeruginosa NOT DETECTED NOT DETECTED Final   Candida albicans NOT DETECTED NOT DETECTED Final   Candida glabrata NOT DETECTED NOT DETECTED Final   Candida krusei NOT DETECTED NOT DETECTED Final   Candida parapsilosis NOT DETECTED NOT DETECTED Final   Candida tropicalis NOT DETECTED NOT DETECTED Final  Culture, blood (Routine X 2) w Reflex to ID Panel     Status: None (Preliminary result)   Collection Time: 01/08/17  6:25 AM  Result Value Ref Range Status   Specimen Description BLOOD PICC LINE  Final   Special Requests   Final    BOTTLES DRAWN AEROBIC AND ANAEROBIC Blood Culture adequate volume   Culture NO GROWTH 4 DAYS  Final   Report Status PENDING  Incomplete  Culture, respiratory (NON-Expectorated)     Status: None   Collection Time: 01/10/17 12:27 PM  Result Value Ref Range Status   Specimen Description TRACHEAL ASPIRATE  Final   Special Requests Normal  Final   Gram Stain   Final    RARE WBC PRESENT,BOTH PMN AND MONONUCLEAR NO SQUAMOUS EPITHELIAL CELLS SEEN NO ORGANISMS SEEN    Culture NO GROWTH 2 DAYS  Final  Report Status 01/12/2017 FINAL  Final    Anti-infectives:  Anti-infectives (From admission, onward)   Start     Dose/Rate Route Frequency Ordered Stop   01/08/17 2100  piperacillin-tazobactam (ZOSYN) IVPB 3.375 g     3.375 g 12.5 mL/hr over 240 Minutes Intravenous Every 8 hours 01/08/17 1422     01/08/17 1500  piperacillin-tazobactam (ZOSYN) IVPB 3.375 g     3.375 g 100 mL/hr over 30 Minutes Intravenous  Once 01/08/17 1422 01/08/17 1732   01/06/17 1430  ceFAZolin (ANCEF) IVPB 1 g/50 mL premix     1 g 100 mL/hr over 30 Minutes Intravenous Every 8 hours 01/06/17 1339 01/07/17 0534   01/06/17 1202  tobramycin (NEBCIN) powder  Status:  Discontinued       As needed 01/06/17 1203 01/06/17 1258   01/06/17 1132  vancomycin (VANCOCIN) powder  Status:  Discontinued       As needed 01/06/17 1133 01/06/17 1258    01/03/17 1500  ceFAZolin (ANCEF) IVPB 1 g/50 mL premix     1 g 100 mL/hr over 30 Minutes Intravenous Every 8 hours 01/03/17 1445 01/05/17 1459   01/03/17 1231  vancomycin (VANCOCIN) powder  Status:  Discontinued       As needed 01/03/17 1231 01/03/17 1431   01/03/17 1000  ceFAZolin (ANCEF) IVPB 2g/100 mL premix     2 g 200 mL/hr over 30 Minutes Intravenous  Once 01/03/17 0946 01/03/17 1118      Best Practice/Protocols:  VTE Prophylaxis: Lovenox (prophylaxtic dose) and Mechanical GI Prophylaxis: Proton Pump Inhibitor Continous Sedation Patient is paralyzed and sedated.    Consults: Treatment Team:  Roby LoftsHaddix, Kevin P, MD Ditty, Loura HaltBenjamin Jared, MD    Events:  Subjective:    Overnight Issues: Has improved oxygenation overnight.  OVer ventilated overnight.  Objective:  Vital signs for last 24 hours: Temp:  [99.1 F (37.3 C)-101.8 F (38.8 C)] 99.9 F (37.7 C) (11/12 0900) Pulse Rate:  [99-143] 117 (11/12 0900) Resp:  [17-28] 28 (11/12 0900) BP: (100-157)/(48-89) 118/55 (11/12 0900) SpO2:  [61 %-100 %] 100 % (11/12 0900) Arterial Line BP: (69-163)/(35-81) 121/63 (11/12 0900) FiO2 (%):  [50 %-100 %] 50 % (11/12 0808) Weight:  [108.1 kg (238 lb 5.1 oz)] 108.1 kg (238 lb 5.1 oz) (11/12 0400)  Hemodynamic parameters for last 24 hours:    Intake/Output from previous day: 11/11 0701 - 11/12 0700 In: 1725.5 [I.V.:1447.9; NG/GT:27.7; IV Piggyback:250] Out: 4770 [Urine:4770]  Intake/Output this shift: No intake/output data recorded.  Vent settings for last 24 hours: Vent Mode: PCV FiO2 (%):  [50 %-100 %] 50 % Set Rate:  [28 bmp] 28 bmp PEEP:  [14 cmH20] 14 cmH20 Pressure Support:  [14 cmH20] 14 cmH20 Plateau Pressure:  [28 cmH20-36 cmH20] 30 cmH20  Physical Exam:  General: no respiratory distress and paralyzed and sedated Neuro: RASS -3 or deeper and paralyzed ans sedated. Resp: clear to auscultation bilaterally and CXE is fairly clear. CVS: regular rate and rhythm,  S1, S2 normal, no murmur, click, rub or gallop and intermittent sinus tachycardia GI: Soft, good bowel sounds.  Massive bowel movement last night. Extremities: edema 2+ and no changes  Results for orders placed or performed during the hospital encounter of 01/03/17 (from the past 24 hour(s))  Glucose, capillary     Status: Abnormal   Collection Time: 01/12/17 11:28 AM  Result Value Ref Range   Glucose-Capillary 204 (H) 65 - 99 mg/dL  Glucose, capillary     Status: Abnormal  Collection Time: 01/12/17  3:24 PM  Result Value Ref Range   Glucose-Capillary 333 (H) 65 - 99 mg/dL  Blood gas, arterial     Status: Abnormal   Collection Time: 01/12/17  5:59 PM  Result Value Ref Range   FIO2 100.00    Mode PRESSURE CONTROL    LHR 28 resp/min   Peep/cpap 14.0 cm H20   Pressure control 30.0 cm H20   pH, Arterial 7.642 (HH) 7.350 - 7.450   pCO2 arterial 27.3 (L) 32.0 - 48.0 mmHg   pO2, Arterial 343 (H) 83.0 - 108.0 mmHg   Bicarbonate 30.0 (H) 20.0 - 28.0 mmol/L   Acid-Base Excess 7.9 (H) 0.0 - 2.0 mmol/L   O2 Saturation 99.8 %   Patient temperature 98.6    Collection site ARTERIAL LINE    Drawn by [id number]    Sample type ARTERIAL DRAW   Glucose, capillary     Status: Abnormal   Collection Time: 01/12/17  8:08 PM  Result Value Ref Range   Glucose-Capillary 328 (H) 65 - 99 mg/dL  Glucose, capillary     Status: Abnormal   Collection Time: 01/12/17 11:24 PM  Result Value Ref Range   Glucose-Capillary 372 (H) 65 - 99 mg/dL  Glucose, capillary     Status: Abnormal   Collection Time: 01/13/17  3:53 AM  Result Value Ref Range   Glucose-Capillary 315 (H) 65 - 99 mg/dL  Glucose, capillary     Status: Abnormal   Collection Time: 01/13/17  7:53 AM  Result Value Ref Range   Glucose-Capillary 314 (H) 65 - 99 mg/dL   Comment 1 Notify RN    Comment 2 Document in Chart      Assessment/Plan:   NEURO  Altered Mental Status:  paralyzed and sedated.     Plan: Wean paralytic and sedation, probably  starting tomorrow.   PULM  Respiratory Alkalosis (iatrogenic and non-therapeutic hyperventilation.) CXR is clear   Plan: Has been on PC ventilation with PC above PEEP of 30 and rate of 28.  ABG confirms hyperventilation with pH 7.69.  Have cut back PC to 22, and RR to 18.  Will repeat ABG at 1400  CARDIO  Sinus Tachycardia   Plan: No specific treatment.  CPM  RENAL  Has been and is still being diuresed heavily with Lasix.     Plan: Will watch this as we continue to wean on the ventilator.  GI  No specific issues   Plan: Restart tube feedings.  ID  No known infectious problems.  Pulmonary cultures are negative.     Plan: COnsider stopping empiric antibiotics  HEME  Anemia acute blood loss anemia and anemia of critical illness)   Plan: CPM  ENDO Hyperglycemia exacerbated with start of Solumedrol.   Plan: Taper Solumedrol and Adjust insulin.  Global Issues  Patient being overly ventilated and PaO2 144 of PEEP 14, FIO2 50%..  Adjustments are being made.  Will restart tube feedings.  Taper Solumedrol and recheck ABG later today.  Dropped PEEP to 12.   LOS: 10 days   Additional comments:I reviewed the patient's new clinical lab test results. cbc/bmet and I reviewed the patients new imaging test results. CXR  Critical Care Total Time*: 1 Hour  Niyonna Betsill 01/13/2017  *Care during the described time interval was provided by me and/or other providers on the critical care team.  I have reviewed this patient's available data, including medical history, events of note, physical examination and test results as part  of my evaluation.

## 2017-01-14 LAB — CBC WITH DIFFERENTIAL/PLATELET
Basophils Absolute: 0 10*3/uL (ref 0.0–0.1)
Basophils Relative: 0 %
EOS ABS: 0 10*3/uL (ref 0.0–0.7)
Eosinophils Relative: 0 %
HCT: 24.1 % — ABNORMAL LOW (ref 39.0–52.0)
HEMOGLOBIN: 7.5 g/dL — AB (ref 13.0–17.0)
LYMPHS ABS: 1.5 10*3/uL (ref 0.7–4.0)
LYMPHS PCT: 12 %
MCH: 29.6 pg (ref 26.0–34.0)
MCHC: 31.1 g/dL (ref 30.0–36.0)
MCV: 95.3 fL (ref 78.0–100.0)
Monocytes Absolute: 0.4 10*3/uL (ref 0.1–1.0)
Monocytes Relative: 4 %
Neutro Abs: 9.9 10*3/uL — ABNORMAL HIGH (ref 1.7–7.7)
Neutrophils Relative %: 84 %
Platelets: 523 10*3/uL — ABNORMAL HIGH (ref 150–400)
RBC: 2.53 MIL/uL — AB (ref 4.22–5.81)
RDW: 14.9 % (ref 11.5–15.5)
WBC: 11.8 10*3/uL — ABNORMAL HIGH (ref 4.0–10.5)

## 2017-01-14 LAB — BASIC METABOLIC PANEL
Anion gap: 9 (ref 5–15)
BUN: 56 mg/dL — AB (ref 6–20)
CHLORIDE: 109 mmol/L (ref 101–111)
CO2: 31 mmol/L (ref 22–32)
Calcium: 8.5 mg/dL — ABNORMAL LOW (ref 8.9–10.3)
Creatinine, Ser: 1.07 mg/dL (ref 0.61–1.24)
GFR calc Af Amer: >60 mL/min (ref >60)
GFR calc non Af Amer: >60 mL/min (ref >60)
Glucose, Bld: 358 mg/dL — ABNORMAL HIGH (ref 65–99)
POTASSIUM: 2.8 mmol/L — AB (ref 3.5–5.1)
SODIUM: 149 mmol/L — AB (ref 135–145)

## 2017-01-14 LAB — GLUCOSE, CAPILLARY
GLUCOSE-CAPILLARY: 324 mg/dL — AB (ref 65–99)
GLUCOSE-CAPILLARY: 301 mg/dL — AB (ref 65–99)
GLUCOSE-CAPILLARY: 218 mg/dL — AB (ref 65–99)
GLUCOSE-CAPILLARY: 222 mg/dL — AB (ref 65–99)
Glucose-Capillary: 242 mg/dL — ABNORMAL HIGH (ref 65–99)
Glucose-Capillary: 342 mg/dL — ABNORMAL HIGH (ref 65–99)
Glucose-Capillary: 262 mg/dL — ABNORMAL HIGH (ref 65–99)

## 2017-01-14 LAB — MAGNESIUM: MAGNESIUM: 2.6 mg/dL — AB (ref 1.7–2.4)

## 2017-01-14 MED ORDER — GLUCERNA 1.2 CAL PO LIQD
1000.0000 mL | ORAL | Status: DC
  Administered 2017-01-14 – 2017-01-18 (×6): 1000 mL
  Filled 2017-01-14 (×15): qty 1000

## 2017-01-14 MED ORDER — HYDRALAZINE HCL 20 MG/ML IJ SOLN
10.0000 mg | Freq: Four times a day (QID) | INTRAMUSCULAR | Status: DC | PRN
  Administered 2017-01-14 – 2017-01-27 (×11): 10 mg via INTRAVENOUS
  Filled 2017-01-14 (×13): qty 1

## 2017-01-14 MED ORDER — INSULIN GLARGINE 100 UNIT/ML ~~LOC~~ SOLN
50.0000 [IU] | Freq: Two times a day (BID) | SUBCUTANEOUS | Status: DC
  Administered 2017-01-14 – 2017-01-21 (×14): 50 [IU] via SUBCUTANEOUS
  Filled 2017-01-14 (×17): qty 0.5

## 2017-01-14 MED ORDER — METOPROLOL TARTRATE 25 MG/10 ML ORAL SUSPENSION
100.0000 mg | Freq: Two times a day (BID) | ORAL | Status: DC
  Administered 2017-01-14 (×2): 100 mg
  Filled 2017-01-14 (×2): qty 40

## 2017-01-14 MED ORDER — MIDAZOLAM HCL 50 MG/10ML IJ SOLN
2.0000 mg/h | INTRAMUSCULAR | Status: DC
  Administered 2017-01-14: 6 mg/h via INTRAVENOUS
  Administered 2017-01-15: 4 mg/h via INTRAVENOUS
  Administered 2017-01-15: 6 mg/h via INTRAVENOUS
  Administered 2017-01-15 – 2017-01-16 (×2): 4 mg/h via INTRAVENOUS
  Administered 2017-01-18: 2 mg/h via INTRAVENOUS
  Filled 2017-01-14 (×4): qty 20

## 2017-01-14 MED ORDER — POTASSIUM CHLORIDE 20 MEQ/15ML (10%) PO SOLN
20.0000 meq | Freq: Three times a day (TID) | ORAL | Status: AC
  Administered 2017-01-14 (×3): 20 meq
  Filled 2017-01-14 (×3): qty 15

## 2017-01-14 MED ORDER — POTASSIUM CHLORIDE 10 MEQ/50ML IV SOLN
10.0000 meq | INTRAVENOUS | Status: AC
  Administered 2017-01-14 (×2): 10 meq via INTRAVENOUS
  Filled 2017-01-14 (×2): qty 50

## 2017-01-14 MED ORDER — METHYLPREDNISOLONE SODIUM SUCC 40 MG IJ SOLR
40.0000 mg | Freq: Two times a day (BID) | INTRAMUSCULAR | Status: DC
  Administered 2017-01-14 – 2017-01-15 (×2): 40 mg via INTRAVENOUS
  Filled 2017-01-14 (×2): qty 1
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
## 2017-01-14 NOTE — Progress Notes (Signed)
CRITICAL VALUE ALERT  Critical Value:  K 2.8  Date & Time Notied:  01/14/17 0535  Provider Notified: Dr. Milford Cage. Connor  Orders Received/Actions taken: K replacement orders to be added
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
## 2017-01-15 ENCOUNTER — Inpatient Hospital Stay (HOSPITAL_COMMUNITY): Payer: Self-pay

## 2017-01-15 LAB — GLUCOSE, CAPILLARY
GLUCOSE-CAPILLARY: 219 mg/dL — AB (ref 65–99)
GLUCOSE-CAPILLARY: 264 mg/dL — AB (ref 65–99)
GLUCOSE-CAPILLARY: 230 mg/dL — AB (ref 65–99)
Glucose-Capillary: 224 mg/dL — ABNORMAL HIGH (ref 65–99)
Glucose-Capillary: 283 mg/dL — ABNORMAL HIGH (ref 65–99)
Glucose-Capillary: 268 mg/dL — ABNORMAL HIGH (ref 65–99)

## 2017-01-15 LAB — BASIC METABOLIC PANEL
Anion gap: 6 (ref 5–15)
BUN: 52 mg/dL — AB (ref 6–20)
CALCIUM: 9 mg/dL (ref 8.9–10.3)
CO2: 32 mmol/L (ref 22–32)
Chloride: 116 mmol/L — ABNORMAL HIGH (ref 101–111)
Creatinine, Ser: 0.89 mg/dL (ref 0.61–1.24)
GFR calc Af Amer: >60 mL/min (ref >60)
GFR calc non Af Amer: >60 mL/min (ref >60)
GLUCOSE: 229 mg/dL — AB (ref 65–99)
POTASSIUM: 3.4 mmol/L — AB (ref 3.5–5.1)
SODIUM: 154 mmol/L — AB (ref 135–145)

## 2017-01-15 LAB — CBC
HCT: 27.5 % — ABNORMAL LOW (ref 39.0–52.0)
Hemoglobin: 8.2 g/dL — ABNORMAL LOW (ref 13.0–17.0)
MCH: 28.9 pg (ref 26.0–34.0)
MCHC: 29.8 g/dL — AB (ref 30.0–36.0)
MCV: 96.8 fL (ref 78.0–100.0)
PLATELETS: 475 10*3/uL — AB (ref 150–400)
RBC: 2.84 MIL/uL — ABNORMAL LOW (ref 4.22–5.81)
RDW: 14.9 % (ref 11.5–15.5)
WBC: 11.6 10*3/uL — ABNORMAL HIGH (ref 4.0–10.5)

## 2017-01-15 MED ORDER — CLONIDINE HCL 0.3 MG/24HR TD PTWK
0.3000 mg | MEDICATED_PATCH | TRANSDERMAL | Status: DC
  Administered 2017-01-15 – 2017-01-22 (×2): 0.3 mg via TRANSDERMAL
  Filled 2017-01-15 (×2): qty 1

## 2017-01-15 MED ORDER — METHYLPREDNISOLONE SODIUM SUCC 40 MG IJ SOLR
40.0000 mg | INTRAMUSCULAR | Status: AC
  Administered 2017-01-16: 40 mg via INTRAVENOUS
  Filled 2017-01-15: qty 1

## 2017-01-15 MED ORDER — NICARDIPINE HCL IN NACL 20-0.86 MG/200ML-% IV SOLN
3.0000 mg/h | INTRAVENOUS | Status: DC
  Administered 2017-01-15: 5 mg/h via INTRAVENOUS
  Administered 2017-01-15: 15 mg/h via INTRAVENOUS
  Filled 2017-01-15 (×4): qty 200

## 2017-01-15 MED ORDER — NICARDIPINE HCL IN NACL 40-0.83 MG/200ML-% IV SOLN
3.0000 mg/h | INTRAVENOUS | Status: DC
  Administered 2017-01-15 – 2017-01-16 (×6): 15 mg/h via INTRAVENOUS
  Administered 2017-01-16: 08:00:00 via INTRAVENOUS
  Administered 2017-01-16 – 2017-01-17 (×7): 15 mg/h via INTRAVENOUS
  Administered 2017-01-17 (×2): 12.5 mg/h via INTRAVENOUS
  Administered 2017-01-17: 15 mg/h via INTRAVENOUS
  Administered 2017-01-17 (×2): 12.5 mg/h via INTRAVENOUS
  Administered 2017-01-17: 15 mg/h via INTRAVENOUS
  Administered 2017-01-18: 12.5 mg/h via INTRAVENOUS
  Administered 2017-01-18: 15 mg/h via INTRAVENOUS
  Administered 2017-01-18: 10 mg/h via INTRAVENOUS
  Administered 2017-01-18 (×3): 12.5 mg/h via INTRAVENOUS
  Administered 2017-01-18 – 2017-01-19 (×2): 15 mg/h via INTRAVENOUS
  Administered 2017-01-19: 10 mg/h via INTRAVENOUS
  Administered 2017-01-19 (×2): 15 mg/h via INTRAVENOUS
  Administered 2017-01-19: 3 mg/h via INTRAVENOUS
  Administered 2017-01-19: 15 mg/h via INTRAVENOUS
  Administered 2017-01-20 – 2017-01-21 (×3): 10 mg/h via INTRAVENOUS
  Administered 2017-01-21: 5 mg/h via INTRAVENOUS
  Administered 2017-01-21: 10 mg/h via INTRAVENOUS
  Administered 2017-01-22 (×2): 12.5 mg/h via INTRAVENOUS
  Administered 2017-01-22: 10 mg/h via INTRAVENOUS
  Administered 2017-01-22 (×4): 12.5 mg/h via INTRAVENOUS
  Administered 2017-01-23: 10 mg/h via INTRAVENOUS
  Administered 2017-01-23: 5 mg/h via INTRAVENOUS
  Administered 2017-01-23: 10 mg/h via INTRAVENOUS
  Filled 2017-01-15 (×16): qty 200
  Filled 2017-01-15: qty 400
  Filled 2017-01-15 (×29): qty 200

## 2017-01-15 MED ORDER — FREE WATER
200.0000 mL | Freq: Three times a day (TID) | Status: DC
  Administered 2017-01-15 – 2017-01-20 (×13): 200 mL

## 2017-01-15 MED ORDER — METOPROLOL TARTRATE 25 MG/10 ML ORAL SUSPENSION
125.0000 mg | Freq: Two times a day (BID) | ORAL | Status: DC
  Administered 2017-01-15 (×2): 125 mg
  Filled 2017-01-15 (×3): qty 60

## 2017-01-15 NOTE — Progress Notes (Signed)
Follow up - Trauma and Critical Care  Patient Details:    Mark Mcguire. is an 36 y.o. male.  Lines/tubes : Airway 7.5 mm (Active)  Secured at (cm) 24 cm 01/15/2017  7:57 AM  Measured From Lips 01/15/2017  7:57 AM  Secured Location Right 01/15/2017  7:57 AM  Secured By [employer] 01/15/2017  7:57 AM  Tube Holder Repositioned Yes 01/15/2017  7:57 AM  Cuff Pressure (cm H2O) 22 cm H2O 01/14/2017  8:15 AM  Site Condition Dry 01/15/2017  7:57 AM     PICC Triple Lumen 01/07/17 PICC Left Basilic 48 cm 3 cm (Active)  Indication for Insertion or Continuance of Line Prolonged intravenous therapies 01/15/2017  7:37 AM  Exposed Catheter (cm) 3 cm 01/09/2017  8:00 AM  Site Assessment Clean;Dry;Intact 01/14/2017  8:00 PM  Lumen #1 Status Infusing 01/14/2017  8:00 PM  Lumen #2 Status Infusing 01/14/2017  8:00 PM  Lumen #3 Status Saline locked 01/14/2017  8:00 PM  Dressing Type Transparent;Occlusive 01/14/2017  8:00 PM  Dressing Status Clean;Intact;Dry;Antimicrobial disc in place 01/14/2017  8:00 PM  Line Care Connections checked and tightened 01/14/2017  8:00 PM  Dressing Intervention Dressing changed;Antimicrobial disc changed;Securement device changed 01/15/2017 12:00 AM  Dressing Change Due 01/22/17 01/15/2017 12:00 AM     Arterial Line 01/13/17 Left Radial (Active)  Site Assessment Clean;Dry;Intact 01/14/2017  8:00 PM  Line Status Pulsatile blood flow 01/14/2017  8:00 PM  Art Line Waveform Appropriate;Square wave test performed 01/14/2017  8:00 PM  Art Line Interventions Zeroed and calibrated;Leveled;Connections checked and tightened 01/14/2017  8:00 PM  Color/Movement/Sensation Capillary refill less than 3 sec 01/14/2017  8:00 PM  Dressing Type Transparent;Non-occlusive 01/14/2017  8:00 PM  Dressing Status Clean;Dry;Intact;Antimicrobial disc in place 01/14/2017  8:00 PM  Interventions Dressing changed;Antimicrobial disc changed 01/15/2017 12:00 AM  Dressing Change Due  01/22/17 01/15/2017 12:00 AM     NG/OG Tube Orogastric Center mouth Xray (Active)  Site Assessment Clean;Dry;Intact 01/15/2017  4:00 AM  Ongoing Placement Verification No acute changes, not attributed to clinical condition;No change in respiratory status;No change in cm markings or external length of tube from initial placement 01/15/2017  4:00 AM  Status Infusing tube feed 01/15/2017  4:00 AM  Drainage Appearance Bile 01/13/2017  8:00 AM  Intake (mL) 250 mL 01/09/2017 10:05 PM  Output (mL) 200 mL 01/13/2017 11:00 PM     Urethral Catheter A. Revonda Standard RN Temperature probe 16 Fr. (Active)  Indication for Insertion or Continuance of Catheter Acute urinary retention;Aggressive IV diuresis 01/15/2017  4:00 AM  Site Assessment Clean;Intact 01/15/2017  4:00 AM  Catheter Maintenance Bag below level of bladder;Catheter secured;Drainage bag/tubing not touching floor;Insertion date on drainage bag;No dependent loops;Seal intact 01/15/2017  4:00 AM  Collection Container Standard drainage bag 01/15/2017  4:00 AM  Securement Method Securing device (Describe) 01/15/2017  4:00 AM  Urinary Catheter Interventions Unclamped 01/15/2017  4:00 AM  Output (mL) 150 mL 01/15/2017  8:00 AM    Microbiology/Sepsis markers: Results for orders placed or performed during the hospital encounter of 01/03/17  MRSA PCR Screening     Status: None   Collection Time: 01/03/17  2:43 PM  Result Value Ref Range Status   MRSA by PCR NEGATIVE NEGATIVE Final    Comment:        The GeneXpert MRSA Assay (FDA approved for NASAL specimens only), is one component of a comprehensive MRSA colonization surveillance program. It is not intended to diagnose MRSA infection nor to guide or  monitor treatment for MRSA infections.   Surgical pcr screen     Status: None   Collection Time: 01/06/17  5:15 AM  Result Value Ref Range Status   MRSA, PCR NEGATIVE NEGATIVE Final   Staphylococcus aureus NEGATIVE NEGATIVE Final    Comment:  (NOTE) The Xpert SA Assay (FDA approved for NASAL specimens in patients 34 years of age and older), is one component of a comprehensive surveillance program. It is not intended to diagnose infection nor to guide or monitor treatment. Performed at [facility], [address] [place], [place] [id number]   Culture, blood (Routine X 2) w Reflex to ID Panel     Status: Abnormal   Collection Time: 01/08/17  6:21 AM  Result Value Ref Range Status   Specimen Description BLOOD LEFT FOOT  Final   Special Requests IN PEDIATRIC BOTTLE Blood Culture adequate volume  Final   Culture  Setup Time   Final    GRAM POSITIVE COCCI IN CLUSTERS IN PEDIATRIC BOTTLE CRITICAL RESULT CALLED TO, READ BACK BY AND VERIFIED WITH: Veronda PrudeE. Martin Pharm.D. 9:55 01/09/18 ([email or web address])    Culture (A)  Final    STAPHYLOCOCCUS SPECIES (COAGULASE NEGATIVE) THE SIGNIFICANCE OF ISOLATING THIS ORGANISM FROM A SINGLE SET OF BLOOD CULTURES WHEN MULTIPLE SETS ARE DRAWN IS UNCERTAIN. PLEASE NOTIFY THE MICROBIOLOGY DEPARTMENT WITHIN ONE WEEK IF SPECIATION AND SENSITIVITIES ARE REQUIRED.    Report Status 01/10/2017 FINAL  Final  Blood Culture ID Panel (Reflexed)     Status: Abnormal   Collection Time: 01/08/17  6:21 AM  Result Value Ref Range Status   Enterococcus species NOT DETECTED NOT DETECTED Final   Listeria monocytogenes NOT DETECTED NOT DETECTED Final   Staphylococcus species DETECTED (A) NOT DETECTED Final    Comment: Methicillin (oxacillin) susceptible coagulase negative staphylococcus. Possible blood culture contaminant (unless isolated from more than one blood culture draw or clinical case suggests pathogenicity). No antibiotic treatment is indicated for blood  culture contaminants. CRITICAL RESULT CALLED TO, READ BACK BY AND VERIFIED WITH: Veronda PrudeE. Martin Pharm.D. 9:55 01/09/17 ([email or web address])    Staphylococcus aureus NOT DETECTED NOT DETECTED Final   Methicillin resistance NOT DETECTED NOT DETECTED Final    Streptococcus species NOT DETECTED NOT DETECTED Final   Streptococcus agalactiae NOT DETECTED NOT DETECTED Final   Streptococcus pneumoniae NOT DETECTED NOT DETECTED Final   Streptococcus pyogenes NOT DETECTED NOT DETECTED Final   Acinetobacter baumannii NOT DETECTED NOT DETECTED Final   Enterobacteriaceae species NOT DETECTED NOT DETECTED Final   Enterobacter cloacae complex NOT DETECTED NOT DETECTED Final   Escherichia coli NOT DETECTED NOT DETECTED Final   Klebsiella oxytoca NOT DETECTED NOT DETECTED Final   Klebsiella pneumoniae NOT DETECTED NOT DETECTED Final   Proteus species NOT DETECTED NOT DETECTED Final   Serratia marcescens NOT DETECTED NOT DETECTED Final   Haemophilus influenzae NOT DETECTED NOT DETECTED Final   Neisseria meningitidis NOT DETECTED NOT DETECTED Final   Pseudomonas aeruginosa NOT DETECTED NOT DETECTED Final   Candida albicans NOT DETECTED NOT DETECTED Final   Candida glabrata NOT DETECTED NOT DETECTED Final   Candida krusei NOT DETECTED NOT DETECTED Final   Candida parapsilosis NOT DETECTED NOT DETECTED Final   Candida tropicalis NOT DETECTED NOT DETECTED Final  Culture, blood (Routine X 2) w Reflex to ID Panel     Status: None   Collection Time: 01/08/17  6:25 AM  Result Value Ref Range Status   Specimen Description BLOOD PICC LINE  Final   Special Requests  Final    BOTTLES DRAWN AEROBIC AND ANAEROBIC Blood Culture adequate volume   Culture NO GROWTH 5 DAYS  Final   Report Status 01/13/2017 FINAL  Final  Culture, respiratory (NON-Expectorated)     Status: None   Collection Time: 01/10/17 12:27 PM  Result Value Ref Range Status   Specimen Description TRACHEAL ASPIRATE  Final   Special Requests Normal  Final   Gram Stain   Final    RARE WBC PRESENT,BOTH PMN AND MONONUCLEAR NO SQUAMOUS EPITHELIAL CELLS SEEN NO ORGANISMS SEEN    Culture NO GROWTH 2 DAYS  Final   Report Status 01/12/2017 FINAL  Final    Anti-infectives:  Anti-infectives (From  admission, onward)   Start     Dose/Rate Route Frequency Ordered Stop   01/08/17 2100  piperacillin-tazobactam (ZOSYN) IVPB 3.375 g     3.375 g 12.5 mL/hr over 240 Minutes Intravenous Every 8 hours 01/08/17 1422     01/08/17 1500  piperacillin-tazobactam (ZOSYN) IVPB 3.375 g     3.375 g 100 mL/hr over 30 Minutes Intravenous  Once 01/08/17 1422 01/08/17 1732   01/06/17 1430  ceFAZolin (ANCEF) IVPB 1 g/50 mL premix     1 g 100 mL/hr over 30 Minutes Intravenous Every 8 hours 01/06/17 1339 01/07/17 0534   01/06/17 1202  tobramycin (NEBCIN) powder  Status:  Discontinued       As needed 01/06/17 1203 01/06/17 1258   01/06/17 1132  vancomycin (VANCOCIN) powder  Status:  Discontinued       As needed 01/06/17 1133 01/06/17 1258   01/03/17 1500  ceFAZolin (ANCEF) IVPB 1 g/50 mL premix     1 g 100 mL/hr over 30 Minutes Intravenous Every 8 hours 01/03/17 1445 01/05/17 1459   01/03/17 1231  vancomycin (VANCOCIN) powder  Status:  Discontinued       As needed 01/03/17 1231 01/03/17 1431   01/03/17 1000  ceFAZolin (ANCEF) IVPB 2g/100 mL premix     2 g 200 mL/hr over 30 Minutes Intravenous  Once 01/03/17 0946 01/03/17 1118      Best Practice/Protocols:  VTE Prophylaxis: Lovenox (prophylaxtic dose) and Mechanical GI Prophylaxis: Proton Pump Inhibitor Continous Sedation  Consults: Treatment Team:  Roby Lofts, MD    Events:  Subjective:    Overnight Issues: Patient doing better on the ventilator, but still remains very hypertensive.  Objective:  Vital signs for last 24 hours: Temp:  [98.7 F (37.1 C)-101.3 F (38.5 C)] 99.3 F (37.4 C) (11/14 0800) Pulse Rate:  [65-113] 66 (11/14 0800) Resp:  [0-24] 14 (11/14 0800) BP: (142-201)/(62-91) 182/81 (11/14 0800) SpO2:  [93 %-100 %] 100 % (11/14 0800) Arterial Line BP: (135-183)/(63-135) 172/78 (11/14 0800) FiO2 (%):  [40 %] 40 % (11/14 0800) Weight:  [106.5 kg (234 lb 12.6 oz)] 106.5 kg (234 lb 12.6 oz) (11/14 0343)  Hemodynamic  parameters for last 24 hours:    Intake/Output from previous day: 11/13 0701 - 11/14 0700 In: 1600.1 [I.V.:449.1; NG/GT:1051; IV Piggyback:100] Out: 3300 [Urine:3300]  Intake/Output this shift: Total I/O In: 72 [I.V.:12; NG/GT:60] Out: 150 [Urine:150]  Vent settings for last 24 hours: Vent Mode: PCV FiO2 (%):  [40 %] 40 % Set Rate:  [18 bmp] 18 bmp PEEP:  [8 cmH20] 8 cmH20 Plateau Pressure:  [19 cmH20-26 cmH20] 19 cmH20  Physical Exam:  General: no respiratory distress and will awaken.  Did follow commands for me this AM very consistently Neuro: oriented, nonfocal exam, RASS 0 and following commands. HEENT/Neck: no  JVD, ETT WNL  and ETT was high, but was adjusted by the RT prior to my coming to the bedside. Resp: clear to auscultation bilaterally and CXr only shows some mild atelectasis on the left base CVS: regular rate and rhythm, S1, S2 normal, no murmur, click, rub or gallop GI: soft, nontender, BS WNL, no r/g and tolerating tube feedings and having bowel  movements Extremities: no edema, no erythema, pulses WNL  Results for orders placed or performed during the hospital encounter of 01/03/17 (from the past 24 hour(s))  Glucose, capillary     Status: Abnormal   Collection Time: 01/14/17 11:37 AM  Result Value Ref Range   Glucose-Capillary 342 (H) 65 - 99 mg/dL   Comment 1 Notify RN    Comment 2 Document in Chart   Glucose, capillary     Status: Abnormal   Collection Time: 01/14/17  4:01 PM  Result Value Ref Range   Glucose-Capillary 218 (H) 65 - 99 mg/dL   Comment 1 Notify RN    Comment 2 Document in Chart   Glucose, capillary     Status: Abnormal   Collection Time: 01/14/17  7:31 PM  Result Value Ref Range   Glucose-Capillary 222 (H) 65 - 99 mg/dL   Comment 1 Notify RN    Comment 2 Document in Chart   Glucose, capillary     Status: Abnormal   Collection Time: 01/14/17 11:24 PM  Result Value Ref Range   Glucose-Capillary 262 (H) 65 - 99 mg/dL  CBC     Status:  Abnormal   Collection Time: 01/15/17  2:55 AM  Result Value Ref Range   WBC 11.6 (H) 4.0 - 10.5 K/uL   RBC 2.84 (L) 4.22 - 5.81 MIL/uL   Hemoglobin 8.2 (L) 13.0 - 17.0 g/dL   HCT 16.1 (L) 09.6 - 04.5 %   MCV 96.8 78.0 - 100.0 fL   MCH 28.9 26.0 - 34.0 pg   MCHC 29.8 (L) 30.0 - 36.0 g/dL   RDW 40.9 81.1 - 91.4 %   Platelets 475 (H) 150 - 400 K/uL  Basic metabolic panel     Status: Abnormal   Collection Time: 01/15/17  2:55 AM  Result Value Ref Range   Sodium 154 (H) 135 - 145 mmol/L   Potassium 3.4 (L) 3.5 - 5.1 mmol/L   Chloride 116 (H) 101 - 111 mmol/L   CO2 32 22 - 32 mmol/L   Glucose, Bld 229 (H) 65 - 99 mg/dL   BUN 52 (H) 6 - 20 mg/dL   Creatinine, Ser 7.82 0.61 - 1.24 mg/dL   Calcium 9.0 8.9 - 95.6 mg/dL   GFR calc non Af Amer >60 >60 mL/min   GFR calc Af Amer >60 >60 mL/min   Anion gap 6 5 - 15  Glucose, capillary     Status: Abnormal   Collection Time: 01/15/17  3:04 AM  Result Value Ref Range   Glucose-Capillary 219 (H) 65 - 99 mg/dL  Glucose, capillary     Status: Abnormal   Collection Time: 01/15/17  7:22 AM  Result Value Ref Range   Glucose-Capillary 224 (H) 65 - 99 mg/dL     Assessment/Plan:   NEURO  Altered Mental Status:  sedation and Not very agitated currently and able to be calm on the ventilator.   Plan: Wean medications as needed to try to get the patient extubated.  PULM  Atelectasis/collapse (focal and left basilar) ABG is pending.   Plan: Probably can decrease  the PEEP today.  CARDIO  No primary cardiac issues   Plan: Hypertension is his primary problem and he is on a number of antihypertensive medications.  Adding Nicardipine and increased metoprolol and Catapres  RENAL  Hypokalemia moderate (2.8 - 3.5 meq/dl) and Hypernatremia moderate (146 - 155 meq/dl)   Plan: Making appropriate adjustments.  GI  Tolerating tube feedings.   Plan: CPM.  Add free water to oral intake  ID  Empirically we have been treating a pneumonia, but nothing has  grown from pulmonary cultures.  Will stop antibiotics.   Plan: Stop antibiotics  HEME  Anemia anemia of critical illness)   Plan: No blood needed currently  ENDO Hypokalemia (moderate (2.8 - 3.5 meq/dl)), Hypernatremia (moderate (146 - 155 meq/dl)) and has been getting solumedrol   Plan: CPM  Global Issues  Patient looking a lot better, much more calm on the ventilator.  Able to follow commands..  Hypertension needs to be better controlled.    LOS: 12 days   Additional comments:I reviewed the patient's new clinical lab test results. cbc/bmet/abg and I reviewed the patients new imaging test results. cxr  Critical Care Total Time*: 45 Minutes  Mark Mcguire 01/15/2017  *Care during the described time interval was provided by me and/or other providers on the critical care team.  I have reviewed this patient's available data, including medical history, events of note, physical examination and test results as part of my evaluation.
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
## 2017-01-16 ENCOUNTER — Inpatient Hospital Stay (HOSPITAL_COMMUNITY): Payer: Self-pay

## 2017-01-16 ENCOUNTER — Inpatient Hospital Stay (HOSPITAL_COMMUNITY): Payer: Self-pay | Admitting: Anesthesiology

## 2017-01-16 ENCOUNTER — Encounter (HOSPITAL_COMMUNITY): Payer: Self-pay | Admitting: Anesthesiology

## 2017-01-16 ENCOUNTER — Encounter (HOSPITAL_COMMUNITY): Admission: EM | Disposition: A | Discharge status: Rehabilitation Facility | Payer: Self-pay | Source: Home / Self Care

## 2017-01-16 HISTORY — PX: OPEN REDUCTION INTERNAL FIXATION (ORIF) DISTAL RADIAL FRACTURE: SHX5989

## 2017-01-16 LAB — BASIC METABOLIC PANEL
ANION GAP: 6 (ref 5–15)
BUN: 33 mg/dL — ABNORMAL HIGH (ref 6–20)
CHLORIDE: 117 mmol/L — AB (ref 101–111)
CO2: 31 mmol/L (ref 22–32)
Calcium: 8.4 mg/dL — ABNORMAL LOW (ref 8.9–10.3)
Creatinine, Ser: 0.62 mg/dL (ref 0.61–1.24)
GFR calc Af Amer: >60 mL/min (ref >60)
GFR calc non Af Amer: >60 mL/min (ref >60)
GLUCOSE: 138 mg/dL — AB (ref 65–99)
POTASSIUM: 3.1 mmol/L — AB (ref 3.5–5.1)
Sodium: 154 mmol/L — ABNORMAL HIGH (ref 135–145)

## 2017-01-16 LAB — GLUCOSE, CAPILLARY
GLUCOSE-CAPILLARY: 200 mg/dL — AB (ref 65–99)
Glucose-Capillary: 163 mg/dL — ABNORMAL HIGH (ref 65–99)
Glucose-Capillary: 170 mg/dL — ABNORMAL HIGH (ref 65–99)
Glucose-Capillary: 194 mg/dL — ABNORMAL HIGH (ref 65–99)
Glucose-Capillary: 276 mg/dL — ABNORMAL HIGH (ref 65–99)
Glucose-Capillary: 74 mg/dL (ref 65–99)

## 2017-01-16 LAB — BLOOD GAS, ARTERIAL
ACID-BASE EXCESS: 9 mmol/L — AB (ref 0.0–2.0)
Bicarbonate: 33.7 mmol/L — ABNORMAL HIGH (ref 20.0–28.0)
DRAWN BY: 12971
FIO2: 40
LHR: 18 {breaths}/min
O2 Saturation: 99 %
PCO2 ART: 54.6 mmHg — AB (ref 32.0–48.0)
PEEP: 8 cmH2O
PO2 ART: 155 mmHg — AB (ref 83.0–108.0)
Patient temperature: 99.4
Pressure control: 22 cmH2O
pH, Arterial: 7.41 (ref 7.350–7.450)

## 2017-01-16 LAB — CBC WITH DIFFERENTIAL/PLATELET
BASOS ABS: 0 10*3/uL (ref 0.0–0.1)
Basophils Relative: 0 %
Eosinophils Absolute: 0.1 10*3/uL (ref 0.0–0.7)
Eosinophils Relative: 1 %
HEMATOCRIT: 29.5 % — AB (ref 39.0–52.0)
Hemoglobin: 8.4 g/dL — ABNORMAL LOW (ref 13.0–17.0)
LYMPHS ABS: 3.2 10*3/uL (ref 0.7–4.0)
LYMPHS PCT: 29 %
MCH: 28 pg (ref 26.0–34.0)
MCHC: 28.5 g/dL — ABNORMAL LOW (ref 30.0–36.0)
MCV: 98.3 fL (ref 78.0–100.0)
Monocytes Absolute: 0.4 10*3/uL (ref 0.1–1.0)
Monocytes Relative: 3 %
NEUTROS ABS: 7.6 10*3/uL (ref 1.7–7.7)
Neutrophils Relative %: 67 %
Platelets: 446 10*3/uL — ABNORMAL HIGH (ref 150–400)
RBC: 3 MIL/uL — AB (ref 4.22–5.81)
RDW: 15.2 % (ref 11.5–15.5)
WBC: 11.2 10*3/uL — AB (ref 4.0–10.5)

## 2017-01-16 LAB — PREPARE RBC (CROSSMATCH)

## 2017-01-16 SURGERY — OPEN REDUCTION INTERNAL FIXATION (ORIF) DISTAL RADIUS FRACTURE
Anesthesia: General | Site: Wrist | Laterality: Right

## 2017-01-16 MED ORDER — VANCOMYCIN HCL 1000 MG IV SOLR
INTRAVENOUS | Status: DC | PRN
  Administered 2017-01-16: 1000 mg via TOPICAL

## 2017-01-16 MED ORDER — ONDANSETRON HCL 4 MG/2ML IJ SOLN
INTRAMUSCULAR | Status: AC
  Filled 2017-01-16: qty 2

## 2017-01-16 MED ORDER — LIDOCAINE 2% (20 MG/ML) 5 ML SYRINGE
INTRAMUSCULAR | Status: AC
  Filled 2017-01-16: qty 5

## 2017-01-16 MED ORDER — FENTANYL CITRATE (PF) 250 MCG/5ML IJ SOLN
INTRAMUSCULAR | Status: AC
  Filled 2017-01-16: qty 5

## 2017-01-16 MED ORDER — POTASSIUM CHLORIDE 10 MEQ/50ML IV SOLN: 10.0000 meq | INTRAVENOUS | Status: DC

## 2017-01-16 MED ORDER — CEFAZOLIN SODIUM-DEXTROSE 2-3 GM-%(50ML) IV SOLR
INTRAVENOUS | Status: DC | PRN
  Administered 2017-01-16: 2 g via INTRAVENOUS

## 2017-01-16 MED ORDER — PROPOFOL 10 MG/ML IV BOLUS
INTRAVENOUS | Status: AC
  Filled 2017-01-16: qty 20

## 2017-01-16 MED ORDER — VANCOMYCIN HCL 1000 MG IV SOLR
INTRAVENOUS | Status: AC
  Filled 2017-01-16: qty 1000

## 2017-01-16 MED ORDER — MIDAZOLAM HCL 2 MG/2ML IJ SOLN
INTRAMUSCULAR | Status: AC
  Filled 2017-01-16: qty 2

## 2017-01-16 MED ORDER — CEFAZOLIN SODIUM-DEXTROSE 2-4 GM/100ML-% IV SOLN
2.0000 g | Freq: Three times a day (TID) | INTRAVENOUS | Status: AC
  Administered 2017-01-17 (×2): 2 g via INTRAVENOUS
  Filled 2017-01-16 (×2): qty 100

## 2017-01-16 MED ORDER — FENTANYL CITRATE (PF) 100 MCG/2ML IJ SOLN
INTRAMUSCULAR | Status: DC | PRN
  Administered 2017-01-16 (×3): 100 ug via INTRAVENOUS
  Administered 2017-01-16: 50 ug via INTRAVENOUS

## 2017-01-16 MED ORDER — CEFAZOLIN SODIUM 1 G IJ SOLR
INTRAMUSCULAR | Status: AC
  Filled 2017-01-16: qty 20

## 2017-01-16 MED ORDER — POTASSIUM CHLORIDE 10 MEQ/50ML IV SOLN
INTRAVENOUS | Status: AC
Start: 2017-01-16 — End: 2017-01-16
  Filled 2017-01-16: qty 50

## 2017-01-16 MED ORDER — CEFAZOLIN SODIUM-DEXTROSE 2-4 GM/100ML-% IV SOLN
2.0000 g | Freq: Three times a day (TID) | INTRAVENOUS | Status: DC
  Administered 2017-01-16: 2 g via INTRAVENOUS
  Filled 2017-01-16 (×3): qty 100

## 2017-01-16 MED ORDER — ONDANSETRON HCL 4 MG/2ML IJ SOLN
INTRAMUSCULAR | Status: DC | PRN
  Administered 2017-01-16: 4 mg via INTRAVENOUS

## 2017-01-16 MED ORDER — METOPROLOL TARTRATE 25 MG/10 ML ORAL SUSPENSION
125.0000 mg | Freq: Two times a day (BID) | ORAL | Status: DC
  Administered 2017-01-16 – 2017-01-19 (×6): 125 mg via ORAL
  Filled 2017-01-16 (×10): qty 60

## 2017-01-16 MED ORDER — CEFAZOLIN SODIUM-DEXTROSE 2-4 GM/100ML-% IV SOLN
2.0000 g | INTRAVENOUS | Status: AC
  Administered 2017-01-16: 2 g via INTRAVENOUS
  Filled 2017-01-16: qty 100

## 2017-01-16 MED ORDER — POTASSIUM CHLORIDE 10 MEQ/50ML IV SOLN
10.0000 meq | INTRAVENOUS | Status: AC
  Administered 2017-01-16 (×5): 10 meq via INTRAVENOUS
  Filled 2017-01-16 (×4): qty 50

## 2017-01-16 MED ORDER — 0.9 % SODIUM CHLORIDE (POUR BTL) OPTIME
TOPICAL | Status: DC | PRN
  Administered 2017-01-16: 1000 mL

## 2017-01-16 MED ORDER — SODIUM CHLORIDE 0.9 % IV SOLN: Freq: Once | INTRAVENOUS | Status: AC

## 2017-01-16 SURGICAL SUPPLY — 76 items
BANDAGE ACE 3X5.8 VEL STRL LF (GAUZE/BANDAGES/DRESSINGS) ×3 IMPLANT
BANDAGE ACE 4X5 VEL STRL LF (GAUZE/BANDAGES/DRESSINGS) ×3 IMPLANT
BIT DRILL 100X2XQC STRL (BIT) ×1 IMPLANT
BIT DRILL CALIBRATED 1.8MM (BIT) ×1 IMPLANT
BIT DRILL QC 2.0X100 (BIT) ×2
BIT DRL 100X2XQC STRL (BIT) ×1
BLADE CLIPPER SURG (BLADE) IMPLANT
BNDG ESMARK 4X9 LF (GAUZE/BANDAGES/DRESSINGS) ×3 IMPLANT
BNDG GAUZE ELAST 4 BULKY (GAUZE/BANDAGES/DRESSINGS) ×3 IMPLANT
CORDS BIPOLAR (ELECTRODE) ×3 IMPLANT
COVER SURGICAL LIGHT HANDLE (MISCELLANEOUS) ×3 IMPLANT
CUFF TOURNIQUET SINGLE 18IN (TOURNIQUET CUFF) ×3 IMPLANT
CUFF TOURNIQUET SINGLE 24IN (TOURNIQUET CUFF) IMPLANT
DECANTER SPIKE VIAL GLASS SM (MISCELLANEOUS) IMPLANT
DRAIN TLS ROUND 10FR (DRAIN) IMPLANT
DRAPE OEC MINIVIEW 54X84 (DRAPES) IMPLANT
DRAPE U-SHAPE 47X51 STRL (DRAPES) ×3 IMPLANT
DRILL CALIBRATED 1.8MM (BIT) ×3
DRSG ADAPTIC 3X8 NADH LF (GAUZE/BANDAGES/DRESSINGS) ×3 IMPLANT
GAUZE SPONGE 4X4 12PLY STRL (GAUZE/BANDAGES/DRESSINGS) ×6 IMPLANT
GAUZE XEROFORM 5X9 LF (GAUZE/BANDAGES/DRESSINGS) ×3 IMPLANT
GLOVE BIOGEL M 8.0 STRL (GLOVE) ×3 IMPLANT
GLOVE SS BIOGEL STRL SZ 8 (GLOVE) ×1 IMPLANT
GLOVE SUPERSENSE BIOGEL SZ 8 (GLOVE) ×2
GOWN STRL REUS W/ TWL LRG LVL3 (GOWN DISPOSABLE) ×3 IMPLANT
GOWN STRL REUS W/ TWL XL LVL3 (GOWN DISPOSABLE) ×3 IMPLANT
GOWN STRL REUS W/TWL LRG LVL3 (GOWN DISPOSABLE) ×6
GOWN STRL REUS W/TWL XL LVL3 (GOWN DISPOSABLE) ×6
GUIDEWARE NON THREAD 1.25X150 (WIRE) ×9
GUIDEWIRE NON THREAD 1.25X150 (WIRE) ×3 IMPLANT
K-WIRE 2.0 (WIRE) ×2
K-WIRE TROCAR PT 2.0 150MM (WIRE) ×1
KIT BASIN OR (CUSTOM PROCEDURE TRAY) ×3 IMPLANT
KIT ROOM TURNOVER OR (KITS) ×3 IMPLANT
KWIRE TROCAR PT 2.0 150MM (WIRE) ×1 IMPLANT
MANIFOLD NEPTUNE II (INSTRUMENTS) ×3 IMPLANT
NEEDLE 22X1 1/2 (OR ONLY) (NEEDLE) IMPLANT
NS IRRIG 1000ML POUR BTL (IV SOLUTION) ×3 IMPLANT
PACK ORTHO EXTREMITY (CUSTOM PROCEDURE TRAY) ×3 IMPLANT
PAD ARMBOARD 7.5X6 YLW CONV (MISCELLANEOUS) ×6 IMPLANT
PAD CAST 3X4 CTTN HI CHSV (CAST SUPPLIES) ×2 IMPLANT
PAD CAST 4YDX4 CTTN HI CHSV (CAST SUPPLIES) ×2 IMPLANT
PADDING CAST COTTON 3X4 STRL (CAST SUPPLIES) ×4
PADDING CAST COTTON 4X4 STRL (CAST SUPPLIES) ×4
PLATE LOCK VA 68X4H RT (Plate) ×3 IMPLANT
RETRIEVER SUT HEWSON (MISCELLANEOUS) ×3 IMPLANT
SCREW CORTEX 2.7 SLF-TPNG 16MM (Screw) ×6 IMPLANT
SCREW CORTEX SLFTPNG 20MM 2.4 (Screw) ×3 IMPLANT
SCREW LOCK T8 20X2.4XSTVA (Screw) ×2 IMPLANT
SCREW LOCKING 2.4X20 (Screw) ×4 IMPLANT
SCREW LOCKING 2.4X22 (Screw) ×9 IMPLANT
SCREW SELF TAP 14MM (Screw) ×3 IMPLANT
SCREW VA LOCKING 2.4X16 (Screw) ×3 IMPLANT
SCREW VA LOCKING 2.4X18 (Screw) ×3 IMPLANT
SCRUB BETADINE 4OZ XXX (MISCELLANEOUS) ×3 IMPLANT
SLING ARM IMMOBILIZER LRG (SOFTGOODS) ×3 IMPLANT
SOL PREP POV-IOD 4OZ 10% (MISCELLANEOUS) ×3 IMPLANT
SPLINT PLASTER CAST XFAST 5X30 (CAST SUPPLIES) ×1 IMPLANT
SPLINT PLASTER XFAST SET 5X30 (CAST SUPPLIES) ×2
SPONGE LAP 4X18 X RAY DECT (DISPOSABLE) ×3 IMPLANT
SUT ETHILON 3 0 PS 1 (SUTURE) ×6 IMPLANT
SUT MNCRL AB 4-0 PS2 18 (SUTURE) ×3 IMPLANT
SUT PROLENE 3 0 PS 2 (SUTURE) IMPLANT
SUT PROLENE 4 0 PS 2 18 (SUTURE) IMPLANT
SUT VIC AB 2-0 CT1 27 (SUTURE) ×2
SUT VIC AB 2-0 CT1 TAPERPNT 27 (SUTURE) ×1 IMPLANT
SUT VIC AB 3-0 FS2 27 (SUTURE) IMPLANT
SYR CONTROL 10ML LL (SYRINGE) IMPLANT
SYSTEM CHEST DRAIN TLS 7FR (DRAIN) IMPLANT
TOWEL OR 17X24 6PK STRL BLUE (TOWEL DISPOSABLE) ×3 IMPLANT
TOWEL OR 17X26 10 PK STRL BLUE (TOWEL DISPOSABLE) ×3 IMPLANT
TUBE CONNECTING 12'X1/4 (SUCTIONS) ×1
TUBE CONNECTING 12X1/4 (SUCTIONS) ×2 IMPLANT
TUBE EVACUATION TLS (MISCELLANEOUS) IMPLANT
UNDERPAD 30X30 (UNDERPADS AND DIAPERS) ×3 IMPLANT
WATER STERILE IRR 1000ML POUR (IV SOLUTION) IMPLANT
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
## 2017-01-16 NOTE — Anesthesia Postprocedure Evaluation (Signed)
Anesthesia Post Note  Patient: Mark LoseVernell Ruland Jr.  Procedure(s) Performed: OPEN REDUCTION INTERNAL FIXATION (ORIF) DISTAL RADIAL FRACTURE (Right Wrist)     Patient location during evaluation: SICU Anesthesia Type: General Level of consciousness: sedated Pain management: pain level controlled Vital Signs Assessment: post-procedure vital signs reviewed and stable Respiratory status: patient remains intubated per anesthesia plan Cardiovascular status: stable Postop Assessment: no apparent nausea or vomiting Anesthetic complications: no    Last Vitals:  Vitals:   01/16/17 1141 01/16/17 1200  BP:  140/66  Pulse:  (!) 103  Resp:  17  Temp:  37.4 C  SpO2: 96% 96%    Last Pain:  Vitals:   01/16/17 1200  TempSrc: Core                 Lincoln Kleiner DAVID

## 2017-01-16 NOTE — Anesthesia Preprocedure Evaluation (Addendum)
Anesthesia Evaluation  Patient identified by MRN, date of birth, ID band Patient awake    Reviewed: Allergy & Precautions, NPO status , Patient's Chart, lab work & pertinent test results  Airway Mallampati: Intubated       Dental   Pulmonary former smoker,    Pulmonary exam normal + rhonchi        Cardiovascular hypertension, Pt. on medications Normal cardiovascular exam Rhythm:Regular Rate:Tachycardia     Neuro/Psych    GI/Hepatic   Endo/Other  diabetes, Type 2, Oral Hypoglycemic Agents  Renal/GU      Musculoskeletal   Abdominal   Peds  Hematology   Anesthesia Other Findings   Reproductive/Obstetrics                            Anesthesia Physical Anesthesia Plan  ASA: [name]  Anesthesia Plan: General   Post-op Pain Management:    Induction: Intravenous  PONV Risk Score and Plan: 2 and Treatment may vary due to age or medical condition  Airway Management Planned: Oral ETT  Additional Equipment: Arterial line  Intra-op Plan:   Post-operative Plan: Post-operative intubation/ventilation  Informed Consent: I have reviewed the patients History and Physical, chart, labs and discussed the procedure including the risks, benefits and alternatives for the proposed anesthesia with the patient or authorized representative who has indicated his/her understanding and acceptance.     Plan Discussed with: CRNA and Surgeon  Anesthesia Plan Comments:        Anesthesia Quick Evaluation

## 2017-01-16 NOTE — Anesthesia Procedure Notes (Signed)
Date/Time: 01/16/2017 8:05 AM Performed by: Lovie Cholock, Mairyn Lenahan K, CRNA Pre-anesthesia Checklist: Patient identified, Emergency Drugs available, Suction available and Patient being monitored Patient Re-evaluated:Patient Re-evaluated prior to induction Oxygen Delivery Method: Circle system utilized Preoxygenation: Pre-oxygenation with 100% oxygen Induction Type: Inhalational induction with existing ETT

## 2017-01-16 NOTE — Progress Notes (Signed)
Pt to OR with CRNA and team. No issues noted at this time. Briant CedarFraizer, [facility] RN BSN.

## 2017-01-16 NOTE — Progress Notes (Signed)
Follow up - Trauma and Critical Care  Patient Details:    Mark Pollio. is an 36 y.o. male.  Lines/tubes : Airway 7.5 mm (Active)  Secured at (cm) 24 cm 01/16/2017  3:34 AM  Measured From Lips 01/16/2017  3:34 AM  Secured Location Left 01/16/2017  3:34 AM  Secured By [employer] 01/16/2017  3:34 AM  Tube Holder Repositioned Yes 01/16/2017  3:34 AM  Cuff Pressure (cm H2O) 24 cm H2O 01/15/2017 11:12 PM  Site Condition Dry 01/15/2017  3:22 PM     PICC Triple Lumen 01/07/17 PICC Left Basilic 48 cm 3 cm (Active)  Indication for Insertion or Continuance of Line Prolonged intravenous therapies;Limited venous access - need for IV therapy >5 days (PICC only) 01/15/2017  8:00 PM  Exposed Catheter (cm) 3 cm 01/09/2017  8:00 AM  Site Assessment Clean;Dry;Intact 01/15/2017  8:00 PM  Lumen #1 Status Infusing 01/15/2017  8:00 PM  Lumen #2 Status Infusing 01/15/2017  8:00 PM  Lumen #3 Status Saline locked 01/15/2017  8:00 PM  Dressing Type Transparent;Occlusive 01/15/2017  8:00 PM  Dressing Status Clean;Intact;Dry;Antimicrobial disc in place 01/15/2017  8:00 PM  Line Care Connections checked and tightened 01/15/2017  8:00 PM  Dressing Intervention Dressing changed;Antimicrobial disc changed;Securement device changed 01/15/2017 12:00 AM  Dressing Change Due 01/22/17 01/15/2017  8:00 PM     Arterial Line 01/13/17 Left Radial (Active)  Site Assessment Clean;Dry;Intact 01/15/2017  8:00 PM  Line Status Pulsatile blood flow 01/15/2017  8:00 PM  Art Line Waveform Appropriate;Square wave test performed 01/15/2017  8:00 PM  Art Line Interventions Zeroed and calibrated;Leveled 01/15/2017  8:00 PM  Color/Movement/Sensation Capillary refill less than 3 sec 01/15/2017  8:00 PM  Dressing Type Transparent;Non-occlusive 01/15/2017  8:00 PM  Dressing Status Clean;Dry;Intact;Antimicrobial disc in place 01/15/2017  8:00 PM  Interventions Dressing changed;Antimicrobial disc changed 01/15/2017 12:00  AM  Dressing Change Due 01/22/17 01/15/2017  8:00 PM     NG/OG Tube Orogastric Center mouth Xray (Active)  Site Assessment Clean;Dry;Intact 01/16/2017  4:00 AM  Ongoing Placement Verification No acute changes, not attributed to clinical condition;No change in respiratory status;No change in cm markings or external length of tube from initial placement 01/16/2017  4:00 AM  Status Infusing tube feed 01/16/2017  4:00 AM  Drainage Appearance Bile 01/13/2017  8:00 AM  Intake (mL) 250 mL 01/09/2017 10:05 PM  Output (mL) 200 mL 01/13/2017 11:00 PM     Urethral Catheter A. Revonda Standard RN Temperature probe 16 Fr. (Active)  Indication for Insertion or Continuance of Catheter Acute urinary retention 01/16/2017  4:00 AM  Site Assessment Clean;Intact 01/16/2017  4:00 AM  Catheter Maintenance Bag below level of bladder;Catheter secured;Drainage bag/tubing not touching floor;Seal intact;No dependent loops;Insertion date on drainage bag 01/16/2017  4:00 AM  Collection Container Standard drainage bag 01/16/2017  4:00 AM  Securement Method Securing device (Describe) 01/16/2017  4:00 AM  Urinary Catheter Interventions Unclamped 01/16/2017  4:00 AM  Output (mL) 275 mL 01/16/2017  6:00 AM    Microbiology/Sepsis markers: Results for orders placed or performed during the hospital encounter of 01/03/17  MRSA PCR Screening     Status: None   Collection Time: 01/03/17  2:43 PM  Result Value Ref Range Status   MRSA by PCR NEGATIVE NEGATIVE Final    Comment:        The GeneXpert MRSA Assay (FDA approved for NASAL specimens only), is one component of a comprehensive MRSA colonization surveillance program. It is not intended to  diagnose MRSA infection nor to guide or monitor treatment for MRSA infections.   Surgical pcr screen     Status: None   Collection Time: 01/06/17  5:15 AM  Result Value Ref Range Status   MRSA, PCR NEGATIVE NEGATIVE Final   Staphylococcus aureus NEGATIVE NEGATIVE Final    Comment:  (NOTE) The Xpert SA Assay (FDA approved for NASAL specimens in patients 106 years of age and older), is one component of a comprehensive surveillance program. It is not intended to diagnose infection nor to guide or monitor treatment. Performed at [facility], [address] [place], [place] [address]   Culture, blood (Routine X 2) w Reflex to ID Panel     Status: Abnormal   Collection Time: 01/08/17  6:21 AM  Result Value Ref Range Status   Specimen Description BLOOD LEFT FOOT  Final   Special Requests IN PEDIATRIC BOTTLE Blood Culture adequate volume  Final   Culture  Setup Time   Final    GRAM POSITIVE COCCI IN CLUSTERS IN PEDIATRIC BOTTLE CRITICAL RESULT CALLED TO, READ BACK BY AND VERIFIED WITH: Veronda Prude.D. 9:55 01/09/18 ([email or web address])    Culture (A)  Final    STAPHYLOCOCCUS SPECIES (COAGULASE NEGATIVE) THE SIGNIFICANCE OF ISOLATING THIS ORGANISM FROM A SINGLE SET OF BLOOD CULTURES WHEN MULTIPLE SETS ARE DRAWN IS UNCERTAIN. PLEASE NOTIFY THE MICROBIOLOGY DEPARTMENT WITHIN ONE WEEK IF SPECIATION AND SENSITIVITIES ARE REQUIRED.    Report Status 01/10/2017 FINAL  Final  Blood Culture ID Panel (Reflexed)     Status: Abnormal   Collection Time: 01/08/17  6:21 AM  Result Value Ref Range Status   Enterococcus species NOT DETECTED NOT DETECTED Final   Listeria monocytogenes NOT DETECTED NOT DETECTED Final   Staphylococcus species DETECTED (A) NOT DETECTED Final    Comment: Methicillin (oxacillin) susceptible coagulase negative staphylococcus. Possible blood culture contaminant (unless isolated from more than one blood culture draw or clinical case suggests pathogenicity). No antibiotic treatment is indicated for blood  culture contaminants. CRITICAL RESULT CALLED TO, READ BACK BY AND VERIFIED WITH: Veronda Prude.D. 9:55 01/09/17 ([email or web address])    Staphylococcus aureus NOT DETECTED NOT DETECTED Final   Methicillin resistance NOT DETECTED NOT DETECTED Final    Streptococcus species NOT DETECTED NOT DETECTED Final   Streptococcus agalactiae NOT DETECTED NOT DETECTED Final   Streptococcus pneumoniae NOT DETECTED NOT DETECTED Final   Streptococcus pyogenes NOT DETECTED NOT DETECTED Final   Acinetobacter baumannii NOT DETECTED NOT DETECTED Final   Enterobacteriaceae species NOT DETECTED NOT DETECTED Final   Enterobacter cloacae complex NOT DETECTED NOT DETECTED Final   Escherichia coli NOT DETECTED NOT DETECTED Final   Klebsiella oxytoca NOT DETECTED NOT DETECTED Final   Klebsiella pneumoniae NOT DETECTED NOT DETECTED Final   Proteus species NOT DETECTED NOT DETECTED Final   Serratia marcescens NOT DETECTED NOT DETECTED Final   Haemophilus influenzae NOT DETECTED NOT DETECTED Final   Neisseria meningitidis NOT DETECTED NOT DETECTED Final   Pseudomonas aeruginosa NOT DETECTED NOT DETECTED Final   Candida albicans NOT DETECTED NOT DETECTED Final   Candida glabrata NOT DETECTED NOT DETECTED Final   Candida krusei NOT DETECTED NOT DETECTED Final   Candida parapsilosis NOT DETECTED NOT DETECTED Final   Candida tropicalis NOT DETECTED NOT DETECTED Final  Culture, blood (Routine X 2) w Reflex to ID Panel     Status: None   Collection Time: 01/08/17  6:25 AM  Result Value Ref Range Status   Specimen Description BLOOD PICC  LINE  Final   Special Requests   Final    BOTTLES DRAWN AEROBIC AND ANAEROBIC Blood Culture adequate volume   Culture NO GROWTH 5 DAYS  Final   Report Status 01/13/2017 FINAL  Final  Culture, respiratory (NON-Expectorated)     Status: None   Collection Time: 01/10/17 12:27 PM  Result Value Ref Range Status   Specimen Description TRACHEAL ASPIRATE  Final   Special Requests Normal  Final   Gram Stain   Final    RARE WBC PRESENT,BOTH PMN AND MONONUCLEAR NO SQUAMOUS EPITHELIAL CELLS SEEN NO ORGANISMS SEEN    Culture NO GROWTH 2 DAYS  Final   Report Status 01/12/2017 FINAL  Final    Anti-infectives:  Anti-infectives (From  admission, onward)   Start     Dose/Rate Route Frequency Ordered Stop   01/08/17 2100  piperacillin-tazobactam (ZOSYN) IVPB 3.375 g  Status:  Discontinued     3.375 g 12.5 mL/hr over 240 Minutes Intravenous Every 8 hours 01/08/17 1422 01/15/17 0918   01/08/17 1500  piperacillin-tazobactam (ZOSYN) IVPB 3.375 g     3.375 g 100 mL/hr over 30 Minutes Intravenous  Once 01/08/17 1422 01/08/17 1732   01/06/17 1430  ceFAZolin (ANCEF) IVPB 1 g/50 mL premix     1 g 100 mL/hr over 30 Minutes Intravenous Every 8 hours 01/06/17 1339 01/07/17 0534   01/06/17 1202  tobramycin (NEBCIN) powder  Status:  Discontinued       As needed 01/06/17 1203 01/06/17 1258   01/06/17 1132  vancomycin (VANCOCIN) powder  Status:  Discontinued       As needed 01/06/17 1133 01/06/17 1258   01/03/17 1500  ceFAZolin (ANCEF) IVPB 1 g/50 mL premix     1 g 100 mL/hr over 30 Minutes Intravenous Every 8 hours 01/03/17 1445 01/05/17 1459   01/03/17 1231  vancomycin (VANCOCIN) powder  Status:  Discontinued       As needed 01/03/17 1231 01/03/17 1431   01/03/17 1000  ceFAZolin (ANCEF) IVPB 2g/100 mL premix     2 g 200 mL/hr over 30 Minutes Intravenous  Once 01/03/17 0946 01/03/17 1118      Best Practice/Protocols:  VTE Prophylaxis: Lovenox (prophylaxtic dose) and Mechanical GI Prophylaxis: Proton Pump Inhibitor Continous Sedation  Consults: Treatment Team:  Roby LoftsHaddix, Kevin P, MD    Events:  Subjective:    Overnight Issues: Weaning sedation, but going to the OR today  Objective:  Vital signs for last 24 hours: Temp:  [98.8 F (37.1 C)-101.7 F (38.7 C)] 98.8 F (37.1 C) (11/15 0700) Pulse Rate:  [66-152] 90 (11/15 0700) Resp:  [0-31] 18 (11/15 0700) BP: (104-202)/(51-86) 133/51 (11/15 0700) SpO2:  [93 %-100 %] 100 % (11/15 0700) Arterial Line BP: (104-215)/(50-96) 149/62 (11/15 0700) FiO2 (%):  [40 %] 40 % (11/15 0334) Weight:  [102.4 kg (225 lb 12 oz)] 102.4 kg (225 lb 12 oz) (11/15 0615)  Hemodynamic  parameters for last 24 hours:    Intake/Output from previous day: 11/14 0701 - 11/15 0700 In: 3604.4 [I.V.:2164.4; NG/GT:1440] Out: 3775 [Urine:3775]  Intake/Output this shift: No intake/output data recorded.  Vent settings for last 24 hours: Vent Mode: PCV FiO2 (%):  [40 %] 40 % Set Rate:  [18 bmp] 18 bmp Vt Set:  [550 mL] 550 mL PEEP:  [5 cmH20-8 cmH20] 5 cmH20 Pressure Support:  [5 cmH20] 5 cmH20 Plateau Pressure:  [15 cmH20-17 cmH20] 17 cmH20  Physical Exam:  General: no respiratory distress and sedated this morning. Neuro: nonfocal  exam and RASS -1 Resp: wheezes LLL and LUL CVS: regular rate and rhythm, S1, S2 normal, no murmur, click, rub or gallop GI: Soft, passing gas, having bowel movements Skin: no rash Extremities: edema 1+  Results for orders placed or performed during the hospital encounter of 01/03/17 (from the past 24 hour(s))  Blood gas, arterial     Status: Abnormal   Collection Time: 01/15/17  9:30 AM  Result Value Ref Range   FIO2 40.00    Delivery systems VENTILATOR    Mode PRESSURE CONTROL    LHR 18 resp/min   Peep/cpap 8.0 cm H20   pH, Arterial 7.410 7.350 - 7.450   pCO2 arterial 54.6 (H) 32.0 - 48.0 mmHg   pO2, Arterial 155 (H) 83.0 - 108.0 mmHg   Bicarbonate 33.7 (H) 20.0 - 28.0 mmol/L   Acid-Base Excess 9.0 (H) 0.0 - 2.0 mmol/L   O2 Saturation 99.0 %   Patient temperature 99.4    Collection site A-LINE    Drawn by [phone]    Sample type ARTERIAL DRAW   Glucose, capillary     Status: Abnormal   Collection Time: 01/15/17 11:54 AM  Result Value Ref Range   Glucose-Capillary 283 (H) 65 - 99 mg/dL  Glucose, capillary     Status: Abnormal   Collection Time: 01/15/17  3:46 PM  Result Value Ref Range   Glucose-Capillary 264 (H) 65 - 99 mg/dL  Glucose, capillary     Status: Abnormal   Collection Time: 01/15/17  7:26 PM  Result Value Ref Range   Glucose-Capillary 230 (H) 65 - 99 mg/dL  Glucose, capillary     Status: Abnormal   Collection Time:  01/15/17 11:14 PM  Result Value Ref Range   Glucose-Capillary 268 (H) 65 - 99 mg/dL  Glucose, capillary     Status: Abnormal   Collection Time: 01/16/17  3:22 AM  Result Value Ref Range   Glucose-Capillary 163 (H) 65 - 99 mg/dL  CBC with Differential/Platelet     Status: Abnormal   Collection Time: 01/16/17  4:07 AM  Result Value Ref Range   WBC 11.2 (H) 4.0 - 10.5 K/uL   RBC 3.00 (L) 4.22 - 5.81 MIL/uL   Hemoglobin 8.4 (L) 13.0 - 17.0 g/dL   HCT 60.429.5 (L) 54.039.0 - 98.152.0 %   MCV 98.3 78.0 - 100.0 fL   MCH 28.0 26.0 - 34.0 pg   MCHC 28.5 (L) 30.0 - 36.0 g/dL   RDW 19.115.2 47.811.5 - 29.515.5 %   Platelets 446 (H) 150 - 400 K/uL   Neutrophils Relative % 67 %   Neutro Abs 7.6 1.7 - 7.7 K/uL   Lymphocytes Relative 29 %   Lymphs Abs 3.2 0.7 - 4.0 K/uL   Monocytes Relative 3 %   Monocytes Absolute 0.4 0.1 - 1.0 K/uL   Eosinophils Relative 1 %   Eosinophils Absolute 0.1 0.0 - 0.7 K/uL   Basophils Relative 0 %   Basophils Absolute 0.0 0.0 - 0.1 K/uL  Basic metabolic panel     Status: Abnormal   Collection Time: 01/16/17  4:07 AM  Result Value Ref Range   Sodium 154 (H) 135 - 145 mmol/L   Potassium 3.1 (L) 3.5 - 5.1 mmol/L   Chloride 117 (H) 101 - 111 mmol/L   CO2 31 22 - 32 mmol/L   Glucose, Bld 138 (H) 65 - 99 mg/dL   BUN 33 (H) 6 - 20 mg/dL   Creatinine, Ser 6.210.62 0.61 - 1.24 mg/dL  Calcium 8.4 (L) 8.9 - 10.3 mg/dL   GFR calc non Af Amer >60 >60 mL/min   GFR calc Af Amer >60 >60 mL/min   Anion gap 6 5 - 15  Glucose, capillary     Status: None   Collection Time: 01/16/17  7:24 AM  Result Value Ref Range   Glucose-Capillary 74 65 - 99 mg/dL     Assessment/Plan:   NEURO  Altered Mental Status:  sedation   Plan: wean with attempts to extubate later postoperatively.  PULM  Lungs markedly improved and the patient was able to wean yesterday for three hours.  No CXR today.   Plan: CXR in AM.  Start weaning again postoperatively.  CARDIO  No significant cardiac issues   Plan: CPM   RENAL  Hypokalemia moderate (2.8 - 3.5 meq/dl) and Hypernatremia moderate (146 - 155 meq/dl)   Plan: Slowly starting to improve.  GI  No specific issues   Plan: CPM  ID  No known infectious sources currently.   Plan: CPM  HEME  Anemia acute blood loss anemia and anemia of critical illness)   Plan: Will not transfuse blood for Hgb 8.4  ENDO No specific issues   Plan: CPM  Global Issues  Patient going to the OR for Upper extremity fixation.  Hemoglobin is stable.  Will attempt to wean from the ventilator postoperatively.    LOS: 13 days   Additional comments:I reviewed the patient's new clinical lab test results. cbc/bmet  Critical Care Total Time*: 30 Minutes  Mark Mcguire 01/16/2017  *Care during the described time interval was provided by me and/or other providers on the critical care team.  I have reviewed this patient's available data, including medical history, events of note, physical examination and test results as part of my evaluation.

## 2017-01-16 NOTE — Interval H&P Note (Signed)
History and Physical Interval Note:  01/16/2017 7:19 AM  Mark Devra Doppotten Jr.  has presented today for surgery, with the diagnosis of Distal radius fracture  The various methods of treatment have been discussed with the patient and family. After consideration of risks, benefits and other options for treatment, the patient has consented to  Procedure(s): OPEN REDUCTION INTERNAL FIXATION (ORIF) DISTAL RADIAL FRACTURE (Right) as a surgical intervention .  The patient's history has been reviewed, patient examined, no change in status, stable for surgery.  I have reviewed the patient's chart and labs.  Questions were answered to the patient's satisfaction.     Ardith Test, Gillie MannersKevin P

## 2017-01-16 NOTE — Transfer of Care (Signed)
Immediate Anesthesia Transfer of Care Note  Patient: Mark LoseVernell Traum Jr.  Procedure(s) Performed: OPEN REDUCTION INTERNAL FIXATION (ORIF) DISTAL RADIAL FRACTURE (Right Wrist)  Patient Location: ICU  Anesthesia Type:General  Level of Consciousness: unresponsive and Patient remains intubated per anesthesia plan  Airway & Oxygen Therapy: Patient re-intubated, Patient placed on Ventilator (see vital sign flow sheet for setting) and receiveing RN at bedside  Post-op Assessment: Report given to RN and Post -op Vital signs reviewed and stable  Post vital signs: Reviewed  Last Vitals:  Vitals:   01/16/17 0645 01/16/17 0700  BP: (!) 142/51 (!) 133/51  Pulse: 89 90  Resp: 18 18  Temp: 37.2 C 37.1 C  SpO2: 99% 100%    Last Pain:  Vitals:   01/16/17 0000  TempSrc: Bladder         Complications: No apparent anesthesia complications

## 2017-01-17 ENCOUNTER — Inpatient Hospital Stay (HOSPITAL_COMMUNITY): Payer: Self-pay

## 2017-01-17 LAB — CBC WITH DIFFERENTIAL/PLATELET
Basophils Absolute: 0 10*3/uL (ref 0.0–0.1)
Basophils Relative: 0 %
EOS PCT: 0 %
Eosinophils Absolute: 0 10*3/uL (ref 0.0–0.7)
HEMATOCRIT: 29.2 % — AB (ref 39.0–52.0)
Hemoglobin: 8.7 g/dL — ABNORMAL LOW (ref 13.0–17.0)
LYMPHS ABS: 2.2 10*3/uL (ref 0.7–4.0)
LYMPHS PCT: 16 %
MCH: 29.2 pg (ref 26.0–34.0)
MCHC: 29.8 g/dL — ABNORMAL LOW (ref 30.0–36.0)
MCV: 98 fL (ref 78.0–100.0)
MONO ABS: 0.7 10*3/uL (ref 0.1–1.0)
Monocytes Relative: 5 %
Neutro Abs: 11.4 10*3/uL — ABNORMAL HIGH (ref 1.7–7.7)
Neutrophils Relative %: 79 %
PLATELETS: 403 10*3/uL — AB (ref 150–400)
RBC: 2.98 MIL/uL — ABNORMAL LOW (ref 4.22–5.81)
RDW: 15 % (ref 11.5–15.5)
WBC: 14.3 10*3/uL — ABNORMAL HIGH (ref 4.0–10.5)

## 2017-01-17 LAB — GLUCOSE, CAPILLARY
GLUCOSE-CAPILLARY: 231 mg/dL — AB (ref 65–99)
GLUCOSE-CAPILLARY: 271 mg/dL — AB (ref 65–99)
GLUCOSE-CAPILLARY: 143 mg/dL — AB (ref 65–99)
GLUCOSE-CAPILLARY: 78 mg/dL (ref 65–99)
Glucose-Capillary: 189 mg/dL — ABNORMAL HIGH (ref 65–99)
Glucose-Capillary: 244 mg/dL — ABNORMAL HIGH (ref 65–99)

## 2017-01-17 LAB — BLOOD GAS, ARTERIAL
Acid-Base Excess: 4.9 mmol/L — ABNORMAL HIGH (ref 0.0–2.0)
BICARBONATE: 29.3 mmol/L — AB (ref 20.0–28.0)
DRAWN BY: 313061
FIO2: 40
Mode: POSITIVE
O2 Saturation: 98.2 %
PEEP: 5 cmH2O
PRESSURE SUPPORT: 5 cmH2O
Patient temperature: 98.6
pCO2 arterial: 47.2 mmHg (ref 32.0–48.0)
pH, Arterial: 7.41 (ref 7.350–7.450)
pO2, Arterial: 115 mmHg — ABNORMAL HIGH (ref 83.0–108.0)

## 2017-01-17 LAB — BASIC METABOLIC PANEL
Anion gap: 6 (ref 5–15)
BUN: 33 mg/dL — AB (ref 6–20)
CO2: 28 mmol/L (ref 22–32)
Calcium: 8.1 mg/dL — ABNORMAL LOW (ref 8.9–10.3)
Chloride: 113 mmol/L — ABNORMAL HIGH (ref 101–111)
Creatinine, Ser: 0.7 mg/dL (ref 0.61–1.24)
GFR calc Af Amer: >60 mL/min (ref >60)
GLUCOSE: 262 mg/dL — AB (ref 65–99)
POTASSIUM: 4.3 mmol/L (ref 3.5–5.1)
Sodium: 147 mmol/L — ABNORMAL HIGH (ref 135–145)

## 2017-01-17 NOTE — Progress Notes (Signed)
Orthopaedic Trauma Progress Note  S: Still sedated  O:  Vitals:   01/17/17 0700 01/17/17 0800  BP: 135/72 140/70  Pulse: 85 88  Resp: 17 19  Temp: 99 F (37.2 C) 99 F (37.2 C)  SpO2: 97% 99%  RLE: Boot in place, Incisions clean, dry and intact. Compartments soft and compressible. Warm and well perfused toes. Not following commands  RUE: Splint clean and dry. Warm and well perfused fingers. Compartments soft and compressible  A/P: 36 year old male with right open segmental tibia fracture and right ankle fracture s/p IMN/ORIF and right distal radius fracture s/p ORIF  -NWB RLE/RUE -Every other day dressing changes to RLE with adaptic and kerlix -Keep splint in place for likely 2 weeks then will transition to cast/splint  Roby LoftsKevin P. Haddix, MD [facility] [phone] (phone)

## 2017-01-17 NOTE — Progress Notes (Signed)
At about 0530 this AM, pt's arterial line waveform dampened and is inconsistently in BP readings and very positional. Going by cuff for BP at this time.  Francia GreavesSavannah R Lemar Bakos, RN

## 2017-01-17 NOTE — Progress Notes (Signed)
Follow up - Trauma and Critical Care  Patient Details:    Mark Mcguire. is an 36 y.o. male.  Lines/tubes : Airway 7.5 mm (Active)  Secured at (cm) 24 cm 01/17/2017  8:00 AM  Measured From Lips 01/17/2017  8:00 AM  Secured Location Left 01/17/2017  8:00 AM  Secured By [employer] 01/17/2017  8:00 AM  Tube Holder Repositioned Yes 01/17/2017  8:00 AM  Cuff Pressure (cm H2O) 24 cm H2O 01/17/2017  8:00 AM  Site Condition Dry 01/16/2017  8:00 PM     PICC Triple Lumen 01/07/17 PICC Left Basilic 48 cm 3 cm (Active)  Indication for Insertion or Continuance of Line Prolonged intravenous therapies;Limited venous access - need for IV therapy >5 days (PICC only) 01/17/2017  8:00 AM  Exposed Catheter (cm) 3 cm 01/09/2017  8:00 AM  Site Assessment Clean;Dry;Intact 01/17/2017  8:00 AM  Lumen #1 Status Infusing 01/17/2017  8:00 AM  Lumen #2 Status Infusing 01/17/2017  8:00 AM  Lumen #3 Status Infusing 01/17/2017  8:00 AM  Dressing Type Transparent;Occlusive 01/17/2017  8:00 AM  Dressing Status Clean;Intact;Dry;Antimicrobial disc in place 01/17/2017  8:00 AM  Line Care Connections checked and tightened 01/17/2017  8:00 AM  Dressing Intervention Dressing changed 01/17/2017  8:00 AM  Dressing Change Due 01/22/17 01/17/2017  8:00 AM     Arterial Line 01/13/17 Left Radial (Active)  Site Assessment Clean;Dry;Intact 01/17/2017  8:00 AM  Line Status Pulsatile blood flow 01/17/2017  8:00 AM  Art Line Waveform Appropriate;Square wave test performed 01/17/2017  8:00 AM  Art Line Interventions Zeroed and calibrated;Leveled 01/15/2017  8:00 PM  Color/Movement/Sensation Capillary refill less than 3 sec 01/17/2017  8:00 AM  Dressing Type Transparent;Occlusive 01/17/2017  8:00 AM  Dressing Status Clean;Dry;Intact;Antimicrobial disc in place 01/17/2017  8:00 AM  Interventions Dressing changed;Antimicrobial disc changed 01/15/2017 12:00 AM  Dressing Change Due 01/22/17 01/16/2017  8:00 PM      NG/OG Tube Orogastric Center mouth Xray (Active)  External Length of Tube (cm) - (if applicable) 56 cm 01/16/2017  8:00 PM  Site Assessment Clean;Dry;Intact 01/17/2017  8:00 AM  Ongoing Placement Verification No change in cm markings or external length of tube from initial placement;No change in respiratory status;No acute changes, not attributed to clinical condition 01/17/2017  8:00 AM  Status Infusing tube feed 01/17/2017  8:00 AM  Drainage Appearance Bile 01/13/2017  8:00 AM  Intake (mL) 250 mL 01/09/2017 10:05 PM  Output (mL) 300 mL 01/16/2017 12:00 PM     Urethral Catheter A. Revonda Standard RN Temperature probe 16 Fr. (Active)  Indication for Insertion or Continuance of Catheter Peri-operative use for selective surgical procedure 01/17/2017  7:23 AM  Site Assessment Clean;Intact 01/17/2017  7:23 AM  Catheter Maintenance Bag below level of bladder;Catheter secured;Drainage bag/tubing not touching floor;Insertion date on drainage bag;No dependent loops;Seal intact;Bag emptied prior to transport 01/17/2017  7:23 AM  Collection Container Standard drainage bag 01/17/2017  7:23 AM  Securement Method Securing device (Describe) 01/17/2017  7:23 AM  Urinary Catheter Interventions Unclamped 01/17/2017  7:23 AM  Output (mL) 300 mL 01/17/2017  8:00 AM    Microbiology/Sepsis markers: Results for orders placed or performed during the hospital encounter of 01/03/17  MRSA PCR Screening     Status: None   Collection Time: 01/03/17  2:43 PM  Result Value Ref Range Status   MRSA by PCR NEGATIVE NEGATIVE Final    Comment:        The GeneXpert MRSA Assay (FDA approved  for NASAL specimens only), is one component of a comprehensive MRSA colonization surveillance program. It is not intended to diagnose MRSA infection nor to guide or monitor treatment for MRSA infections.   Surgical pcr screen     Status: None   Collection Time: 01/06/17  5:15 AM  Result Value Ref Range Status   MRSA, PCR NEGATIVE  NEGATIVE Final   Staphylococcus aureus NEGATIVE NEGATIVE Final    Comment: (NOTE) The Xpert SA Assay (FDA approved for NASAL specimens in patients 45 years of age and older), is one component of a comprehensive surveillance program. It is not intended to diagnose infection nor to guide or monitor treatment. Performed at [facility], [address], [place], [place] [address]   Culture, blood (Routine X 2) w Reflex to ID Panel     Status: Abnormal   Collection Time: 01/08/17  6:21 AM  Result Value Ref Range Status   Specimen Description BLOOD LEFT FOOT  Final   Special Requests IN PEDIATRIC BOTTLE Blood Culture adequate volume  Final   Culture  Setup Time   Final    GRAM POSITIVE COCCI IN CLUSTERS IN PEDIATRIC BOTTLE CRITICAL RESULT CALLED TO, READ BACK BY AND VERIFIED WITH: Veronda Prude.D. 9:55 01/09/18 ([email or web address])    Culture (A)  Final    STAPHYLOCOCCUS SPECIES (COAGULASE NEGATIVE) THE SIGNIFICANCE OF ISOLATING THIS ORGANISM FROM A SINGLE SET OF BLOOD CULTURES WHEN MULTIPLE SETS ARE DRAWN IS UNCERTAIN. PLEASE NOTIFY THE MICROBIOLOGY DEPARTMENT WITHIN ONE WEEK IF SPECIATION AND SENSITIVITIES ARE REQUIRED.    Report Status 01/10/2017 FINAL  Final  Blood Culture ID Panel (Reflexed)     Status: Abnormal   Collection Time: 01/08/17  6:21 AM  Result Value Ref Range Status   Enterococcus species NOT DETECTED NOT DETECTED Final   Listeria monocytogenes NOT DETECTED NOT DETECTED Final   Staphylococcus species DETECTED (A) NOT DETECTED Final    Comment: Methicillin (oxacillin) susceptible coagulase negative staphylococcus. Possible blood culture contaminant (unless isolated from more than one blood culture draw or clinical case suggests pathogenicity). No antibiotic treatment is indicated for blood  culture contaminants. CRITICAL RESULT CALLED TO, READ BACK BY AND VERIFIED WITH: Veronda Prude.D. 9:55 01/09/17 ([email or web address])    Staphylococcus aureus NOT DETECTED NOT DETECTED  Final   Methicillin resistance NOT DETECTED NOT DETECTED Final   Streptococcus species NOT DETECTED NOT DETECTED Final   Streptococcus agalactiae NOT DETECTED NOT DETECTED Final   Streptococcus pneumoniae NOT DETECTED NOT DETECTED Final   Streptococcus pyogenes NOT DETECTED NOT DETECTED Final   Acinetobacter baumannii NOT DETECTED NOT DETECTED Final   Enterobacteriaceae species NOT DETECTED NOT DETECTED Final   Enterobacter cloacae complex NOT DETECTED NOT DETECTED Final   Escherichia coli NOT DETECTED NOT DETECTED Final   Klebsiella oxytoca NOT DETECTED NOT DETECTED Final   Klebsiella pneumoniae NOT DETECTED NOT DETECTED Final   Proteus species NOT DETECTED NOT DETECTED Final   Serratia marcescens NOT DETECTED NOT DETECTED Final   Haemophilus influenzae NOT DETECTED NOT DETECTED Final   Neisseria meningitidis NOT DETECTED NOT DETECTED Final   Pseudomonas aeruginosa NOT DETECTED NOT DETECTED Final   Candida albicans NOT DETECTED NOT DETECTED Final   Candida glabrata NOT DETECTED NOT DETECTED Final   Candida krusei NOT DETECTED NOT DETECTED Final   Candida parapsilosis NOT DETECTED NOT DETECTED Final   Candida tropicalis NOT DETECTED NOT DETECTED Final  Culture, blood (Routine X 2) w Reflex to ID Panel     Status: None  Collection Time: 01/08/17  6:25 AM  Result Value Ref Range Status   Specimen Description BLOOD PICC LINE  Final   Special Requests   Final    BOTTLES DRAWN AEROBIC AND ANAEROBIC Blood Culture adequate volume   Culture NO GROWTH 5 DAYS  Final   Report Status 01/13/2017 FINAL  Final  Culture, respiratory (NON-Expectorated)     Status: None   Collection Time: 01/10/17 12:27 PM  Result Value Ref Range Status   Specimen Description TRACHEAL ASPIRATE  Final   Special Requests Normal  Final   Gram Stain   Final    RARE WBC PRESENT,BOTH PMN AND MONONUCLEAR NO SQUAMOUS EPITHELIAL CELLS SEEN NO ORGANISMS SEEN    Culture NO GROWTH 2 DAYS  Final   Report Status 01/12/2017  FINAL  Final    Anti-infectives:  Anti-infectives (From admission, onward)   Start     Dose/Rate Route Frequency Ordered Stop   01/17/17 0230  ceFAZolin (ANCEF) IVPB 2g/100 mL premix     2 g 200 mL/hr over 30 Minutes Intravenous Every 8 hours 01/16/17 2011 01/17/17 1829   01/16/17 2015  ceFAZolin (ANCEF) IVPB 2g/100 mL premix     2 g 200 mL/hr over 30 Minutes Intravenous NOW 01/16/17 2011 01/16/17 2046   01/16/17 1600  ceFAZolin (ANCEF) IVPB 2g/100 mL premix  Status:  Discontinued     2 g 200 mL/hr over 30 Minutes Intravenous Every 8 hours 01/16/17 1217 01/16/17 2011   01/16/17 0941  vancomycin (VANCOCIN) powder  Status:  Discontinued       As needed 01/16/17 0942 01/16/17 1059   01/08/17 2100  piperacillin-tazobactam (ZOSYN) IVPB 3.375 g  Status:  Discontinued     3.375 g 12.5 mL/hr over 240 Minutes Intravenous Every 8 hours 01/08/17 1422 01/15/17 0918   01/08/17 1500  piperacillin-tazobactam (ZOSYN) IVPB 3.375 g     3.375 g 100 mL/hr over 30 Minutes Intravenous  Once 01/08/17 1422 01/08/17 1732   01/06/17 1430  ceFAZolin (ANCEF) IVPB 1 g/50 mL premix     1 g 100 mL/hr over 30 Minutes Intravenous Every 8 hours 01/06/17 1339 01/07/17 0534   01/06/17 1202  tobramycin (NEBCIN) powder  Status:  Discontinued       As needed 01/06/17 1203 01/06/17 1258   01/06/17 1132  vancomycin (VANCOCIN) powder  Status:  Discontinued       As needed 01/06/17 1133 01/06/17 1258   01/03/17 1500  ceFAZolin (ANCEF) IVPB 1 g/50 mL premix     1 g 100 mL/hr over 30 Minutes Intravenous Every 8 hours 01/03/17 1445 01/05/17 1459   01/03/17 1231  vancomycin (VANCOCIN) powder  Status:  Discontinued       As needed 01/03/17 1231 01/03/17 1431   01/03/17 1000  ceFAZolin (ANCEF) IVPB 2g/100 mL premix     2 g 200 mL/hr over 30 Minutes Intravenous  Once 01/03/17 0946 01/03/17 1118      Best Practice/Protocols:  VTE Prophylaxis: Lovenox (prophylaxtic dose) and Mechanical GI Prophylaxis: Proton Pump  Inhibitor Continous Sedation Sedation is being weaned, but is on Dilaudid  Consults: Treatment Team:  Roby LoftsHaddix, Kevin P, MD    Events:  Subjective:    Overnight Issues: Patient is weaning very well on the ventilator  Objective:  Vital signs for last 24 hours: Temp:  [98.2 F (36.8 C)-100.9 F (38.3 C)] 99.1 F (37.3 C) (11/16 0900) Pulse Rate:  [75-133] 89 (11/16 0900) Resp:  [10-26] 10 (11/16 0900) BP: (119-175)/(52-74) 132/71 (11/16 0900)  SpO2:  [95 %-100 %] 99 % (11/16 0900) Arterial Line BP: (83-198)/(55-86) 146/61 (11/16 0900) FiO2 (%):  [40 %] 40 % (11/16 0800) Weight:  [102.8 kg (226 lb 10.1 oz)] 102.8 kg (226 lb 10.1 oz) (11/16 0530)  Hemodynamic parameters for last 24 hours:    Intake/Output from previous day: 11/15 0701 - 11/16 0700 In: 4793.7 [I.V.:2853.7; NG/GT:1840; IV Piggyback:100] Out: 3175 [Urine:2825; Emesis/NG output:300; Blood:50]  Intake/Output this shift: Total I/O In: 308.8 [I.V.:188.8; NG/GT:120] Out: 300 [Urine:300]  Vent settings for last 24 hours: Vent Mode: PSV;CPAP FiO2 (%):  [40 %] 40 % Set Rate:  [18 bmp] 18 bmp PEEP:  [5 cmH20] 5 cmH20 Pressure Support:  [5 cmH20] 5 cmH20 Plateau Pressure:  [16 cmH20-17 cmH20] 17 cmH20  Physical Exam:  General: no respiratory distress and will awaken and follow commnads. Neuro: nonfocal exam and RASS -1 Resp: clear to auscultation bilaterally CVS: regular rate and rhythm, S1, S2 normal, no murmur, click, rub or gallop GI: soft, nontender, BS WNL, no r/g and had a large bowel movement and is tolerating tube feedings well. Extremities: no edema, no erythema, pulses WNL  Results for orders placed or performed during the hospital encounter of 01/03/17 (from the past 24 hour(s))  Glucose, capillary     Status: Abnormal   Collection Time: 01/16/17 12:45 PM  Result Value Ref Range   Glucose-Capillary 170 (H) 65 - 99 mg/dL  Glucose, capillary     Status: Abnormal   Collection Time: 01/16/17  3:20  PM  Result Value Ref Range   Glucose-Capillary 194 (H) 65 - 99 mg/dL  Glucose, capillary     Status: Abnormal   Collection Time: 01/16/17  7:35 PM  Result Value Ref Range   Glucose-Capillary 200 (H) 65 - 99 mg/dL   Comment 1 Notify RN    Comment 2 Document in Chart   Glucose, capillary     Status: Abnormal   Collection Time: 01/16/17 11:46 PM  Result Value Ref Range   Glucose-Capillary 276 (H) 65 - 99 mg/dL   Comment 1 Notify RN    Comment 2 Document in Chart   Glucose, capillary     Status: Abnormal   Collection Time: 01/17/17  3:30 AM  Result Value Ref Range   Glucose-Capillary 231 (H) 65 - 99 mg/dL   Comment 1 Notify RN    Comment 2 Document in Chart   CBC with Differential/Platelet     Status: Abnormal   Collection Time: 01/17/17  4:08 AM  Result Value Ref Range   WBC 14.3 (H) 4.0 - 10.5 K/uL   RBC 2.98 (L) 4.22 - 5.81 MIL/uL   Hemoglobin 8.7 (L) 13.0 - 17.0 g/dL   HCT 91.4 (L) 78.2 - 95.6 %   MCV 98.0 78.0 - 100.0 fL   MCH 29.2 26.0 - 34.0 pg   MCHC 29.8 (L) 30.0 - 36.0 g/dL   RDW 21.3 08.6 - 57.8 %   Platelets 403 (H) 150 - 400 K/uL   Neutrophils Relative % 79 %   Neutro Abs 11.4 (H) 1.7 - 7.7 K/uL   Lymphocytes Relative 16 %   Lymphs Abs 2.2 0.7 - 4.0 K/uL   Monocytes Relative 5 %   Monocytes Absolute 0.7 0.1 - 1.0 K/uL   Eosinophils Relative 0 %   Eosinophils Absolute 0.0 0.0 - 0.7 K/uL   Basophils Relative 0 %   Basophils Absolute 0.0 0.0 - 0.1 K/uL  Basic metabolic panel     Status: Abnormal  Collection Time: 01/17/17  4:08 AM  Result Value Ref Range   Sodium 147 (H) 135 - 145 mmol/L   Potassium 4.3 3.5 - 5.1 mmol/L   Chloride 113 (H) 101 - 111 mmol/L   CO2 28 22 - 32 mmol/L   Glucose, Bld 262 (H) 65 - 99 mg/dL   BUN 33 (H) 6 - 20 mg/dL   Creatinine, Ser 1.610.70 0.61 - 1.24 mg/dL   Calcium 8.1 (L) 8.9 - 10.3 mg/dL   GFR calc non Af Amer >60 >60 mL/min   GFR calc Af Amer >60 >60 mL/min   Anion gap 6 5 - 15  Glucose, capillary     Status: Abnormal    Collection Time: 01/17/17  8:05 AM  Result Value Ref Range   Glucose-Capillary 189 (H) 65 - 99 mg/dL     Assessment/Plan:   NEURO  Altered Mental Status:  sedation   Plan: Doing better on Dilaudid, wean narcotoic now.  PULM  No particular problems now and weaning from the ventilator.   Plan: CPM  CARDIO  No specific issues   Plan: CPM  RENAL  Urine output and renal function are good.   Plan: CPM  GI  No specific issues   Plan: CPM  ID  No known infectious sources   Plan: CPM  HEME  Anemia acute blood loss anemia)   Plan: No blood for now  ENDO Hypernatremia (moderate (146 - 155 meq/dl))   Plan: Improving   Global Issues  Patient is slowly improving.  Weaning from the ventilator.  Probably will not extubate yet.  Continue with tube feedings..      LOS: 14 days   Additional comments:I reviewed the patient's new clinical lab test results. cbc/bmet and I reviewed the patients new imaging test results. cxr  Critical Care Total Time*: 30 Minutes  Jimmye NormanJames Oshae Simmering 01/17/2017  *Care during the described time interval was provided by me and/or other providers on the critical care team.  I have reviewed this patient's available data, including medical history, events of note, physical examination and test results as part of my evaluation.

## 2017-01-18 LAB — GLUCOSE, CAPILLARY
GLUCOSE-CAPILLARY: 159 mg/dL — AB (ref 65–99)
Glucose-Capillary: 156 mg/dL — ABNORMAL HIGH (ref 65–99)
Glucose-Capillary: 183 mg/dL — ABNORMAL HIGH (ref 65–99)
Glucose-Capillary: 192 mg/dL — ABNORMAL HIGH (ref 65–99)
Glucose-Capillary: 205 mg/dL — ABNORMAL HIGH (ref 65–99)
Glucose-Capillary: 212 mg/dL — ABNORMAL HIGH (ref 65–99)
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
## 2017-01-19 ENCOUNTER — Encounter (HOSPITAL_COMMUNITY): Payer: Self-pay

## 2017-01-19 LAB — GLUCOSE, CAPILLARY
GLUCOSE-CAPILLARY: 153 mg/dL — AB (ref 65–99)
GLUCOSE-CAPILLARY: 186 mg/dL — AB (ref 65–99)
GLUCOSE-CAPILLARY: 187 mg/dL — AB (ref 65–99)
GLUCOSE-CAPILLARY: 227 mg/dL — AB (ref 65–99)
GLUCOSE-CAPILLARY: 79 mg/dL (ref 65–99)
GLUCOSE-CAPILLARY: 84 mg/dL (ref 65–99)

## 2017-01-19 LAB — CBC WITH DIFFERENTIAL/PLATELET
BASOS PCT: 0 %
Basophils Absolute: 0 10*3/uL (ref 0.0–0.1)
Eosinophils Absolute: 0.3 10*3/uL (ref 0.0–0.7)
Eosinophils Relative: 2 %
HCT: 27.8 % — ABNORMAL LOW (ref 39.0–52.0)
HEMOGLOBIN: 8.2 g/dL — AB (ref 13.0–17.0)
LYMPHS ABS: 3.3 10*3/uL (ref 0.7–4.0)
Lymphocytes Relative: 22 %
MCH: 28.6 pg (ref 26.0–34.0)
MCHC: 29.5 g/dL — ABNORMAL LOW (ref 30.0–36.0)
MCV: 96.9 fL (ref 78.0–100.0)
Monocytes Absolute: 0.7 10*3/uL (ref 0.1–1.0)
Monocytes Relative: 5 %
NEUTROS PCT: 71 %
Neutro Abs: 10.6 10*3/uL — ABNORMAL HIGH (ref 1.7–7.7)
Platelets: 345 10*3/uL (ref 150–400)
RBC: 2.87 MIL/uL — AB (ref 4.22–5.81)
RDW: 14.9 % (ref 11.5–15.5)
WBC: 14.9 10*3/uL — AB (ref 4.0–10.5)

## 2017-01-19 LAB — BASIC METABOLIC PANEL
Anion gap: 7 (ref 5–15)
BUN: 62 mg/dL — ABNORMAL HIGH (ref 6–20)
CHLORIDE: 109 mmol/L (ref 101–111)
CO2: 28 mmol/L (ref 22–32)
CREATININE: 1.34 mg/dL — AB (ref 0.61–1.24)
Calcium: 8.2 mg/dL — ABNORMAL LOW (ref 8.9–10.3)
GFR calc non Af Amer: >60 mL/min (ref >60)
Glucose, Bld: 206 mg/dL — ABNORMAL HIGH (ref 65–99)
Potassium: 4.4 mmol/L (ref 3.5–5.1)
SODIUM: 144 mmol/L (ref 135–145)

## 2017-01-19 LAB — POCT I-STAT 3, ART BLOOD GAS (G3+)
Acid-Base Excess: 6 mmol/L — ABNORMAL HIGH (ref 0.0–2.0)
Bicarbonate: 31.1 mmol/L — ABNORMAL HIGH (ref 20.0–28.0)
O2 Saturation: 96 %
PH ART: 7.421 (ref 7.350–7.450)
TCO2: 33 mmol/L — ABNORMAL HIGH (ref 22–32)
pCO2 arterial: 48 mmHg (ref 32.0–48.0)
pO2, Arterial: 80 mmHg — ABNORMAL LOW (ref 83.0–108.0)

## 2017-01-19 MED ORDER — FENTANYL CITRATE (PF) 100 MCG/2ML IJ SOLN
100.0000 ug | INTRAMUSCULAR | Status: DC | PRN
  Administered 2017-01-20: 100 ug via INTRAVENOUS

## 2017-01-19 MED ORDER — FENTANYL CITRATE (PF) 100 MCG/2ML IJ SOLN
100.0000 ug | INTRAMUSCULAR | Status: DC | PRN
  Administered 2017-01-19 – 2017-01-20 (×5): 100 ug via INTRAVENOUS
  Filled 2017-01-19 (×6): qty 2

## 2017-01-19 MED ORDER — ACETAMINOPHEN 650 MG RE SUPP
650.0000 mg | RECTAL | Status: DC | PRN
  Administered 2017-01-20: 650 mg via RECTAL
  Filled 2017-01-19 (×2): qty 1

## 2017-01-19 NOTE — Procedures (Signed)
Extubation Procedure Note  Patient Details:   Name: Mark Deluna Jr. Mark Mcguire: 12/08/1980 MRN: [id number]   Airway Documentation:     Evaluation  O2 sats: stable throughout Complications: No apparent complications Patient did tolerate procedure well. Bilateral Breath Sounds: Diminished, Clear   No   Positive cuff leak noted.  Pt placed on [place] 4 L with humidity, tolerating well at this time. No stridor noted.  Pt not talking, but attempting to whisper.  Pt able to reach 750 using incentive spirometer.  Mark Mcguire 01/19/2017, 2:53 PM

## 2017-01-19 NOTE — Progress Notes (Signed)
Wasted 80ml of dilaudid gtt and 80ml versed gtt in sink. Val EagleMeghan Bergman, RN witnessed

## 2017-01-19 NOTE — Progress Notes (Signed)
Follow up - Trauma and Critical Care  Patient Details:    Mark Mcguire. is an 36 y.o. male.  Lines/tubes : Airway 7.5 mm (Active)  Secured at (cm) 23 cm 01/19/2017  8:05 AM  Measured From Lips 01/19/2017  8:05 AM  Secured Location Right 01/19/2017  8:05 AM  Secured By [employer] 01/19/2017  8:05 AM  Tube Holder Repositioned Yes 01/19/2017  8:05 AM  Cuff Pressure (cm H2O) 28 cm H2O 01/19/2017  4:45 AM  Site Condition Dry 01/19/2017  8:05 AM     PICC Triple Lumen 01/07/17 PICC Left Basilic 48 cm 3 cm (Active)  Indication for Insertion or Continuance of Line Prolonged intravenous therapies;Limited venous access - need for IV therapy >5 days (PICC only) 01/19/2017  8:00 AM  Exposed Catheter (cm) 3 cm 01/09/2017  8:00 AM  Site Assessment Clean;Dry;Intact 01/18/2017  8:00 PM  Lumen #1 Status Infusing 01/18/2017  8:00 PM  Lumen #2 Status Infusing 01/18/2017  8:00 PM  Lumen #3 Status Infusing;In-line blood sampling system in place 01/18/2017  8:00 PM  Dressing Type Transparent;Occlusive 01/18/2017  8:00 PM  Dressing Status Clean;Intact;Dry;Antimicrobial disc in place 01/18/2017  8:00 PM  Line Care Connections checked and tightened 01/18/2017  8:00 PM  Dressing Intervention Dressing changed 01/17/2017  8:00 AM  Dressing Change Due 01/22/17 01/18/2017  8:00 PM     NG/OG Tube Orogastric Center mouth Xray (Active)  External Length of Tube (cm) - (if applicable) 56 cm 01/18/2017  8:00 PM  Site Assessment Clean;Dry;Intact 01/18/2017  8:00 PM  Ongoing Placement Verification No change in cm markings or external length of tube from initial placement;No change in respiratory status;No acute changes, not attributed to clinical condition 01/18/2017  8:00 PM  Status Infusing tube feed 01/18/2017  8:00 PM  Drainage Appearance Bile 01/13/2017  8:00 AM  Intake (mL) 250 mL 01/09/2017 10:05 PM  Output (mL) 300 mL 01/16/2017 12:00 PM     External Urinary Catheter (Active)  Collection  Container Standard drainage bag 01/18/2017  8:00 PM  Securement Method Securing device (Describe) 01/18/2017  8:00 PM  Intervention Equipment Changed 01/17/2017  8:00 PM  Output (mL) 200 mL 01/18/2017  2:00 PM    Microbiology/Sepsis markers: Results for orders placed or performed during the hospital encounter of 01/03/17  MRSA PCR Screening     Status: None   Collection Time: 01/03/17  2:43 PM  Result Value Ref Range Status   MRSA by PCR NEGATIVE NEGATIVE Final    Comment:        The GeneXpert MRSA Assay (FDA approved for NASAL specimens only), is one component of a comprehensive MRSA colonization surveillance program. It is not intended to diagnose MRSA infection nor to guide or monitor treatment for MRSA infections.   Surgical pcr screen     Status: None   Collection Time: 01/06/17  5:15 AM  Result Value Ref Range Status   MRSA, PCR NEGATIVE NEGATIVE Final   Staphylococcus aureus NEGATIVE NEGATIVE Final    Comment: (NOTE) The Xpert SA Assay (FDA approved for NASAL specimens in patients 13 years of age and older), is one component of a comprehensive surveillance program. It is not intended to diagnose infection nor to guide or monitor treatment. Performed at [facility], [address], [place], [place] [address]   Culture, blood (Routine X 2) w Reflex to ID Panel     Status: Abnormal   Collection Time: 01/08/17  6:21 AM  Result Value Ref  Range Status   Specimen Description BLOOD LEFT FOOT  Final   Special Requests IN PEDIATRIC BOTTLE Blood Culture adequate volume  Final   Culture  Setup Time   Final    GRAM POSITIVE COCCI IN CLUSTERS IN PEDIATRIC BOTTLE CRITICAL RESULT CALLED TO, READ BACK BY AND VERIFIED WITH: Veronda PrudeE. Martin Pharm.D. 9:55 01/09/18 ([email or web address])    Culture (A)  Final    STAPHYLOCOCCUS SPECIES (COAGULASE NEGATIVE) THE SIGNIFICANCE OF ISOLATING THIS ORGANISM FROM A SINGLE SET OF BLOOD CULTURES WHEN MULTIPLE SETS ARE DRAWN IS UNCERTAIN. PLEASE  NOTIFY THE MICROBIOLOGY DEPARTMENT WITHIN ONE WEEK IF SPECIATION AND SENSITIVITIES ARE REQUIRED.    Report Status 01/10/2017 FINAL  Final  Blood Culture ID Panel (Reflexed)     Status: Abnormal   Collection Time: 01/08/17  6:21 AM  Result Value Ref Range Status   Enterococcus species NOT DETECTED NOT DETECTED Final   Listeria monocytogenes NOT DETECTED NOT DETECTED Final   Staphylococcus species DETECTED (A) NOT DETECTED Final    Comment: Methicillin (oxacillin) susceptible coagulase negative staphylococcus. Possible blood culture contaminant (unless isolated from more than one blood culture draw or clinical case suggests pathogenicity). No antibiotic treatment is indicated for blood  culture contaminants. CRITICAL RESULT CALLED TO, READ BACK BY AND VERIFIED WITH: Veronda PrudeE. Martin Pharm.D. 9:55 01/09/17 ([email or web address])    Staphylococcus aureus NOT DETECTED NOT DETECTED Final   Methicillin resistance NOT DETECTED NOT DETECTED Final   Streptococcus species NOT DETECTED NOT DETECTED Final   Streptococcus agalactiae NOT DETECTED NOT DETECTED Final   Streptococcus pneumoniae NOT DETECTED NOT DETECTED Final   Streptococcus pyogenes NOT DETECTED NOT DETECTED Final   Acinetobacter baumannii NOT DETECTED NOT DETECTED Final   Enterobacteriaceae species NOT DETECTED NOT DETECTED Final   Enterobacter cloacae complex NOT DETECTED NOT DETECTED Final   Escherichia coli NOT DETECTED NOT DETECTED Final   Klebsiella oxytoca NOT DETECTED NOT DETECTED Final   Klebsiella pneumoniae NOT DETECTED NOT DETECTED Final   Proteus species NOT DETECTED NOT DETECTED Final   Serratia marcescens NOT DETECTED NOT DETECTED Final   Haemophilus influenzae NOT DETECTED NOT DETECTED Final   Neisseria meningitidis NOT DETECTED NOT DETECTED Final   Pseudomonas aeruginosa NOT DETECTED NOT DETECTED Final   Candida albicans NOT DETECTED NOT DETECTED Final   Candida glabrata NOT DETECTED NOT DETECTED Final   Candida krusei NOT DETECTED NOT  DETECTED Final   Candida parapsilosis NOT DETECTED NOT DETECTED Final   Candida tropicalis NOT DETECTED NOT DETECTED Final  Culture, blood (Routine X 2) w Reflex to ID Panel     Status: None   Collection Time: 01/08/17  6:25 AM  Result Value Ref Range Status   Specimen Description BLOOD PICC LINE  Final   Special Requests   Final    BOTTLES DRAWN AEROBIC AND ANAEROBIC Blood Culture adequate volume   Culture NO GROWTH 5 DAYS  Final   Report Status 01/13/2017 FINAL  Final  Culture, respiratory (NON-Expectorated)     Status: None   Collection Time: 01/10/17 12:27 PM  Result Value Ref Range Status   Specimen Description TRACHEAL ASPIRATE  Final   Special Requests Normal  Final   Gram Stain   Final    RARE WBC PRESENT,BOTH PMN AND MONONUCLEAR NO SQUAMOUS EPITHELIAL CELLS SEEN NO ORGANISMS SEEN    Culture NO GROWTH 2 DAYS  Final   Report Status 01/12/2017 FINAL  Final    Anti-infectives:  Anti-infectives (From admission, onward)   Start     Dose/Rate Route  Frequency Ordered Stop   01/17/17 0230  ceFAZolin (ANCEF) IVPB 2g/100 mL premix     2 g 200 mL/hr over 30 Minutes Intravenous Every 8 hours 01/16/17 2011 01/17/17 1121   01/16/17 2015  ceFAZolin (ANCEF) IVPB 2g/100 mL premix     2 g 200 mL/hr over 30 Minutes Intravenous NOW 01/16/17 2011 01/16/17 2046   01/16/17 1600  ceFAZolin (ANCEF) IVPB 2g/100 mL premix  Status:  Discontinued     2 g 200 mL/hr over 30 Minutes Intravenous Every 8 hours 01/16/17 1217 01/16/17 2011   01/16/17 0941  vancomycin (VANCOCIN) powder  Status:  Discontinued       As needed 01/16/17 0942 01/16/17 1059   01/08/17 2100  piperacillin-tazobactam (ZOSYN) IVPB 3.375 g  Status:  Discontinued     3.375 g 12.5 mL/hr over 240 Minutes Intravenous Every 8 hours 01/08/17 1422 01/15/17 0918   01/08/17 1500  piperacillin-tazobactam (ZOSYN) IVPB 3.375 g     3.375 g 100 mL/hr over 30 Minutes Intravenous  Once 01/08/17 1422 01/08/17 1732   01/06/17 1430  ceFAZolin  (ANCEF) IVPB 1 g/50 mL premix     1 g 100 mL/hr over 30 Minutes Intravenous Every 8 hours 01/06/17 1339 01/07/17 0534   01/06/17 1202  tobramycin (NEBCIN) powder  Status:  Discontinued       As needed 01/06/17 1203 01/06/17 1258   01/06/17 1132  vancomycin (VANCOCIN) powder  Status:  Discontinued       As needed 01/06/17 1133 01/06/17 1258   01/03/17 1500  ceFAZolin (ANCEF) IVPB 1 g/50 mL premix     1 g 100 mL/hr over 30 Minutes Intravenous Every 8 hours 01/03/17 1445 01/05/17 1459   01/03/17 1231  vancomycin (VANCOCIN) powder  Status:  Discontinued       As needed 01/03/17 1231 01/03/17 1431   01/03/17 1000  ceFAZolin (ANCEF) IVPB 2g/100 mL premix     2 g 200 mL/hr over 30 Minutes Intravenous  Once 01/03/17 0946 01/03/17 1118      Best Practice/Protocols:  VTE Prophylaxis: Lovenox (prophylaxtic dose) and Mechanical GI Prophylaxis: Proton Pump Inhibitor Continous Sedation Dilaudid drip will be stopped.  Consults: Treatment Team:  Roby Lofts, MD    Events:  Subjective:    Overnight Issues: Weaning frm the ventilator, but no acute distress.    Objective:  Vital signs for last 24 hours: Temp:  [99.1 F (37.3 C)-100.4 F (38 C)] 99.1 F (37.3 C) (11/18 0740) Pulse Rate:  [90-118] 111 (11/18 0900) Resp:  [14-21] 16 (11/18 0900) BP: (112-174)/(55-85) 136/68 (11/18 0900) SpO2:  [91 %-100 %] 94 % (11/18 0900) FiO2 (%):  [40 %] 40 % (11/18 0805) Weight:  [101.3 kg (223 lb 5.2 oz)] 101.3 kg (223 lb 5.2 oz) (11/18 0530)  Hemodynamic parameters for last 24 hours:    Intake/Output from previous day: 11/17 0701 - 11/18 0700 In: 3661 [I.V.:2087; NG/GT:1574] Out: 1975 [Urine:1975]  Intake/Output this shift: Total I/O In: 153 [I.V.:93; NG/GT:60] Out: 1450 [Urine:1450]  Vent settings for last 24 hours: Vent Mode: PSV;CPAP FiO2 (%):  [40 %] 40 % Set Rate:  [18 bmp] 18 bmp PEEP:  [5 cmH20] 5 cmH20 Pressure Support:  [5 cmH20-8 cmH20] 5 cmH20 Plateau Pressure:  [11  cmH20-19 cmH20] 19 cmH20  Physical Exam:  General: no respiratory distress and Seems withdrawn Neuro: nonfocal exam, RASS 0 and will follwo commands. HEENT/Neck: no JVD, ETT WNL  and has cuff leak with the balloon down Resp: clear to  auscultation bilaterally CVS: sinus tachycardia GI: soft, nontender, BS WNL, no r/g and tube feedings are off for potential extubation. Extremities: no edema, no erythema, pulses WNL  Results for orders placed or performed during the hospital encounter of 01/03/17 (from the past 24 hour(s))  Glucose, capillary     Status: Abnormal   Collection Time: 01/18/17 12:12 PM  Result Value Ref Range   Glucose-Capillary 156 (H) 65 - 99 mg/dL  Glucose, capillary     Status: Abnormal   Collection Time: 01/18/17  4:05 PM  Result Value Ref Range   Glucose-Capillary 183 (H) 65 - 99 mg/dL  Glucose, capillary     Status: Abnormal   Collection Time: 01/18/17  9:16 PM  Result Value Ref Range   Glucose-Capillary 205 (H) 65 - 99 mg/dL  Glucose, capillary     Status: Abnormal   Collection Time: 01/18/17 11:53 PM  Result Value Ref Range   Glucose-Capillary 192 (H) 65 - 99 mg/dL  Glucose, capillary     Status: Abnormal   Collection Time: 01/19/17  4:21 AM  Result Value Ref Range   Glucose-Capillary 187 (H) 65 - 99 mg/dL  Basic metabolic panel     Status: Abnormal   Collection Time: 01/19/17  4:38 AM  Result Value Ref Range   Sodium 144 135 - 145 mmol/L   Potassium 4.4 3.5 - 5.1 mmol/L   Chloride 109 101 - 111 mmol/L   CO2 28 22 - 32 mmol/L   Glucose, Bld 206 (H) 65 - 99 mg/dL   BUN 62 (H) 6 - 20 mg/dL   Creatinine, Ser 1.61 (H) 0.61 - 1.24 mg/dL   Calcium 8.2 (L) 8.9 - 10.3 mg/dL   GFR calc non Af Amer >60 >60 mL/min   GFR calc Af Amer >60 >60 mL/min   Anion gap 7 5 - 15  CBC with Differential/Platelet     Status: Abnormal   Collection Time: 01/19/17  4:38 AM  Result Value Ref Range   WBC 14.9 (H) 4.0 - 10.5 K/uL   RBC 2.87 (L) 4.22 - 5.81 MIL/uL   Hemoglobin  8.2 (L) 13.0 - 17.0 g/dL   HCT 09.6 (L) 04.5 - 40.9 %   MCV 96.9 78.0 - 100.0 fL   MCH 28.6 26.0 - 34.0 pg   MCHC 29.5 (L) 30.0 - 36.0 g/dL   RDW 81.1 91.4 - 78.2 %   Platelets 345 150 - 400 K/uL   Neutrophils Relative % 71 %   Neutro Abs 10.6 (H) 1.7 - 7.7 K/uL   Lymphocytes Relative 22 %   Lymphs Abs 3.3 0.7 - 4.0 K/uL   Monocytes Relative 5 %   Monocytes Absolute 0.7 0.1 - 1.0 K/uL   Eosinophils Relative 2 %   Eosinophils Absolute 0.3 0.0 - 0.7 K/uL   Basophils Relative 0 %   Basophils Absolute 0.0 0.0 - 0.1 K/uL  Glucose, capillary     Status: Abnormal   Collection Time: 01/19/17  7:27 AM  Result Value Ref Range   Glucose-Capillary 186 (H) 65 - 99 mg/dL     Assessment/Plan:   NEURO  Altered Mental Status:  obtundation, sedation and withdrawn   Plan: May leave sedation off for potential extubation today.  PULM  No CXR today.   Plan: Will get CXR tomorrow and potentially extubate today.  CARDIO  Sinus Tachycardia   Plan: CPM  RENAL  Not voiding Actue Renal Failure (due to obstruction)   Plan: Will replace the Foley catheter.  GI  Has been tolerating tube feedings well.   Plan: CPM  ID  No known infectious sources   Plan: CPM  HEME  Anemia acute blood loss anemia)   Plan: No blood for now.  ENDO Hyperglycemia improved.   Plan: CPM  Global Issues  Weaning but has a lot of subglottic secretions.  Has cuff leak.  Renal dysfunction has doubled    LOS: 16 days   Additional comments:I reviewed the patient's new clinical lab test results. cbc/bmet and I reviewed the patients new imaging test results. Old CXr from Friday.  Critical Care Total Time*: 30 Minutes  Mark Mcguire 01/19/2017  *Care during the described time interval was provided by me and/or other providers on the critical care team.  I have reviewed this patient's available data, including medical history, events of note, physical examination and test results as part of my evaluation.

## 2017-01-20 ENCOUNTER — Inpatient Hospital Stay (HOSPITAL_COMMUNITY): Payer: Self-pay

## 2017-01-20 ENCOUNTER — Encounter (HOSPITAL_COMMUNITY): Payer: Self-pay | Admitting: Student

## 2017-01-20 LAB — CBC WITH DIFFERENTIAL/PLATELET
BASOS ABS: 0 10*3/uL (ref 0.0–0.1)
Basophils Relative: 0 %
Eosinophils Absolute: 0.1 10*3/uL (ref 0.0–0.7)
Eosinophils Relative: 1 %
HEMATOCRIT: 27.6 % — AB (ref 39.0–52.0)
Hemoglobin: 7.9 g/dL — ABNORMAL LOW (ref 13.0–17.0)
Lymphocytes Relative: 15 %
Lymphs Abs: 1.6 10*3/uL (ref 0.7–4.0)
MCH: 27.4 pg (ref 26.0–34.0)
MCHC: 28.6 g/dL — ABNORMAL LOW (ref 30.0–36.0)
MCV: 95.8 fL (ref 78.0–100.0)
MONOS PCT: 4 %
Monocytes Absolute: 0.5 10*3/uL (ref 0.1–1.0)
NEUTROS PCT: 80 %
Neutro Abs: 9 10*3/uL — ABNORMAL HIGH (ref 1.7–7.7)
PLATELETS: 343 10*3/uL (ref 150–400)
RBC: 2.88 MIL/uL — ABNORMAL LOW (ref 4.22–5.81)
RDW: 14.8 % (ref 11.5–15.5)
WBC: 11.1 10*3/uL — AB (ref 4.0–10.5)

## 2017-01-20 LAB — BASIC METABOLIC PANEL
Anion gap: 5 (ref 5–15)
BUN: 32 mg/dL — ABNORMAL HIGH (ref 6–20)
CALCIUM: 8.5 mg/dL — AB (ref 8.9–10.3)
CO2: 31 mmol/L (ref 22–32)
CREATININE: 0.84 mg/dL (ref 0.61–1.24)
Chloride: 114 mmol/L — ABNORMAL HIGH (ref 101–111)
GFR calc non Af Amer: >60 mL/min (ref >60)
Glucose, Bld: 121 mg/dL — ABNORMAL HIGH (ref 65–99)
Potassium: 4.5 mmol/L (ref 3.5–5.1)
SODIUM: 150 mmol/L — AB (ref 135–145)

## 2017-01-20 LAB — URINALYSIS, ROUTINE W REFLEX MICROSCOPIC
Bilirubin Urine: NEGATIVE
GLUCOSE, UA: NEGATIVE mg/dL
Ketones, ur: NEGATIVE mg/dL
NITRITE: NEGATIVE
PH: 5 (ref 5.0–8.0)
PROTEIN: NEGATIVE mg/dL
Specific Gravity, Urine: 1.019 (ref 1.005–1.030)

## 2017-01-20 LAB — TYPE AND SCREEN
ABO/RH(D): B POS
ANTIBODY SCREEN: NEGATIVE
Unit division: 0

## 2017-01-20 LAB — GLUCOSE, CAPILLARY
GLUCOSE-CAPILLARY: 108 mg/dL — AB (ref 65–99)
GLUCOSE-CAPILLARY: 119 mg/dL — AB (ref 65–99)
GLUCOSE-CAPILLARY: 171 mg/dL — AB (ref 65–99)
Glucose-Capillary: 125 mg/dL — ABNORMAL HIGH (ref 65–99)
Glucose-Capillary: 132 mg/dL — ABNORMAL HIGH (ref 65–99)
Glucose-Capillary: 142 mg/dL — ABNORMAL HIGH (ref 65–99)

## 2017-01-20 LAB — BPAM RBC
BLOOD PRODUCT EXPIRATION DATE: 201811222359
UNIT TYPE AND RH: 1700

## 2017-01-20 MED ORDER — ORAL CARE MOUTH RINSE
15.0000 mL | OROMUCOSAL | Status: DC
  Administered 2017-01-20 – 2017-01-21 (×5): 15 mL via OROMUCOSAL

## 2017-01-20 MED ORDER — IPRATROPIUM-ALBUTEROL 0.5-2.5 (3) MG/3ML IN SOLN
3.0000 mL | Freq: Three times a day (TID) | RESPIRATORY_TRACT | Status: DC
  Administered 2017-01-21: 3 mL via RESPIRATORY_TRACT
  Filled 2017-01-20: qty 3

## 2017-01-20 MED ORDER — METOPROLOL TARTRATE 50 MG PO TABS
125.0000 mg | ORAL_TABLET | Freq: Two times a day (BID) | ORAL | Status: DC
  Administered 2017-01-20 – 2017-01-29 (×18): 125 mg via ORAL
  Filled 2017-01-20 (×19): qty 1

## 2017-01-20 NOTE — Progress Notes (Signed)
Follow up - Trauma and Critical Care  Patient Details:    Mark Mcguire. is an 36 y.o. male.  Lines/tubes : Airway 7.5 mm (Active)  Secured at (cm) 23 cm 01/19/2017  8:05 AM  Measured From Lips 01/19/2017  8:05 AM  Secured Location Right 01/19/2017  8:05 AM  Secured By [employer] 01/19/2017  8:05 AM  Tube Holder Repositioned Yes 01/19/2017  8:05 AM  Cuff Pressure (cm H2O) 28 cm H2O 01/19/2017  4:45 AM  Site Condition Dry 01/19/2017  8:05 AM     PICC Triple Lumen 01/07/17 PICC Left Basilic 48 cm 3 cm (Active)  Indication for Insertion or Continuance of Line Prolonged intravenous therapies;Limited venous access - need for IV therapy >5 days (PICC only) 01/19/2017  8:00 AM  Exposed Catheter (cm) 3 cm 01/09/2017  8:00 AM  Site Assessment Clean;Dry;Intact 01/18/2017  8:00 PM  Lumen #1 Status Infusing 01/18/2017  8:00 PM  Lumen #2 Status Infusing 01/18/2017  8:00 PM  Lumen #3 Status Infusing;In-line blood sampling system in place 01/18/2017  8:00 PM  Dressing Type Transparent;Occlusive 01/18/2017  8:00 PM  Dressing Status Clean;Intact;Dry;Antimicrobial disc in place 01/18/2017  8:00 PM  Line Care Connections checked and tightened 01/18/2017  8:00 PM  Dressing Intervention Dressing changed 01/17/2017  8:00 AM  Dressing Change Due 01/22/17 01/18/2017  8:00 PM     NG/OG Tube Orogastric Center mouth Xray (Active)  External Length of Tube (cm) - (if applicable) 56 cm 01/18/2017  8:00 PM  Site Assessment Clean;Dry;Intact 01/18/2017  8:00 PM  Ongoing Placement Verification No change in cm markings or external length of tube from initial placement;No change in respiratory status;No acute changes, not attributed to clinical condition 01/18/2017  8:00 PM  Status Infusing tube feed 01/18/2017  8:00 PM  Drainage Appearance Bile 01/13/2017  8:00 AM  Intake (mL) 250 mL 01/09/2017 10:05 PM  Output (mL) 300 mL 01/16/2017 12:00 PM     External Urinary Catheter (Active)  Collection  Container Standard drainage bag 01/18/2017  8:00 PM  Securement Method Securing device (Describe) 01/18/2017  8:00 PM  Intervention Equipment Changed 01/17/2017  8:00 PM  Output (mL) 200 mL 01/18/2017  2:00 PM    Microbiology/Sepsis markers: Results for orders placed or performed during the hospital encounter of 01/03/17  MRSA PCR Screening     Status: None   Collection Time: 01/03/17  2:43 PM  Result Value Ref Range Status   MRSA by PCR NEGATIVE NEGATIVE Final    Comment:        The GeneXpert MRSA Assay (FDA approved for NASAL specimens only), is one component of a comprehensive MRSA colonization surveillance program. It is not intended to diagnose MRSA infection nor to guide or monitor treatment for MRSA infections.   Surgical pcr screen     Status: None   Collection Time: 01/06/17  5:15 AM  Result Value Ref Range Status   MRSA, PCR NEGATIVE NEGATIVE Final   Staphylococcus aureus NEGATIVE NEGATIVE Final    Comment: (NOTE) The Xpert SA Assay (FDA approved for NASAL specimens in patients 13 years of age and older), is one component of a comprehensive surveillance program. It is not intended to diagnose infection nor to guide or monitor treatment. Performed at [facility], [address], [place], [place] [address]   Culture, blood (Routine X 2) w Reflex to ID Panel     Status: Abnormal   Collection Time: 01/08/17  6:21 AM  Result Value Ref  Range Status   Specimen Description BLOOD LEFT FOOT  Final   Special Requests IN PEDIATRIC BOTTLE Blood Culture adequate volume  Final   Culture  Setup Time   Final    GRAM POSITIVE COCCI IN CLUSTERS IN PEDIATRIC BOTTLE CRITICAL RESULT CALLED TO, READ BACK BY AND VERIFIED WITH: Veronda PrudeE. Martin Pharm.D. 9:55 01/09/18 ([email or web address])    Culture (A)  Final    STAPHYLOCOCCUS SPECIES (COAGULASE NEGATIVE) THE SIGNIFICANCE OF ISOLATING THIS ORGANISM FROM A SINGLE SET OF BLOOD CULTURES WHEN MULTIPLE SETS ARE DRAWN IS UNCERTAIN. PLEASE  NOTIFY THE MICROBIOLOGY DEPARTMENT WITHIN ONE WEEK IF SPECIATION AND SENSITIVITIES ARE REQUIRED.    Report Status 01/10/2017 FINAL  Final  Blood Culture ID Panel (Reflexed)     Status: Abnormal   Collection Time: 01/08/17  6:21 AM  Result Value Ref Range Status   Enterococcus species NOT DETECTED NOT DETECTED Final   Listeria monocytogenes NOT DETECTED NOT DETECTED Final   Staphylococcus species DETECTED (A) NOT DETECTED Final    Comment: Methicillin (oxacillin) susceptible coagulase negative staphylococcus. Possible blood culture contaminant (unless isolated from more than one blood culture draw or clinical case suggests pathogenicity). No antibiotic treatment is indicated for blood  culture contaminants. CRITICAL RESULT CALLED TO, READ BACK BY AND VERIFIED WITH: Veronda PrudeE. Martin Pharm.D. 9:55 01/09/17 ([email or web address])    Staphylococcus aureus NOT DETECTED NOT DETECTED Final   Methicillin resistance NOT DETECTED NOT DETECTED Final   Streptococcus species NOT DETECTED NOT DETECTED Final   Streptococcus agalactiae NOT DETECTED NOT DETECTED Final   Streptococcus pneumoniae NOT DETECTED NOT DETECTED Final   Streptococcus pyogenes NOT DETECTED NOT DETECTED Final   Acinetobacter baumannii NOT DETECTED NOT DETECTED Final   Enterobacteriaceae species NOT DETECTED NOT DETECTED Final   Enterobacter cloacae complex NOT DETECTED NOT DETECTED Final   Escherichia coli NOT DETECTED NOT DETECTED Final   Klebsiella oxytoca NOT DETECTED NOT DETECTED Final   Klebsiella pneumoniae NOT DETECTED NOT DETECTED Final   Proteus species NOT DETECTED NOT DETECTED Final   Serratia marcescens NOT DETECTED NOT DETECTED Final   Haemophilus influenzae NOT DETECTED NOT DETECTED Final   Neisseria meningitidis NOT DETECTED NOT DETECTED Final   Pseudomonas aeruginosa NOT DETECTED NOT DETECTED Final   Candida albicans NOT DETECTED NOT DETECTED Final   Candida glabrata NOT DETECTED NOT DETECTED Final   Candida krusei NOT DETECTED NOT  DETECTED Final   Candida parapsilosis NOT DETECTED NOT DETECTED Final   Candida tropicalis NOT DETECTED NOT DETECTED Final  Culture, blood (Routine X 2) w Reflex to ID Panel     Status: None   Collection Time: 01/08/17  6:25 AM  Result Value Ref Range Status   Specimen Description BLOOD PICC LINE  Final   Special Requests   Final    BOTTLES DRAWN AEROBIC AND ANAEROBIC Blood Culture adequate volume   Culture NO GROWTH 5 DAYS  Final   Report Status 01/13/2017 FINAL  Final  Culture, respiratory (NON-Expectorated)     Status: None   Collection Time: 01/10/17 12:27 PM  Result Value Ref Range Status   Specimen Description TRACHEAL ASPIRATE  Final   Special Requests Normal  Final   Gram Stain   Final    RARE WBC PRESENT,BOTH PMN AND MONONUCLEAR NO SQUAMOUS EPITHELIAL CELLS SEEN NO ORGANISMS SEEN    Culture NO GROWTH 2 DAYS  Final   Report Status 01/12/2017 FINAL  Final    Anti-infectives:  Anti-infectives (From admission, onward)   Start     Dose/Rate Route  Frequency Ordered Stop   01/17/17 0230  ceFAZolin (ANCEF) IVPB 2g/100 mL premix     2 g 200 mL/hr over 30 Minutes Intravenous Every 8 hours 01/16/17 2011 01/17/17 1121   01/16/17 2015  ceFAZolin (ANCEF) IVPB 2g/100 mL premix     2 g 200 mL/hr over 30 Minutes Intravenous NOW 01/16/17 2011 01/16/17 2046   01/16/17 1600  ceFAZolin (ANCEF) IVPB 2g/100 mL premix  Status:  Discontinued     2 g 200 mL/hr over 30 Minutes Intravenous Every 8 hours 01/16/17 1217 01/16/17 2011   01/16/17 0941  vancomycin (VANCOCIN) powder  Status:  Discontinued       As needed 01/16/17 0942 01/16/17 1059   01/08/17 2100  piperacillin-tazobactam (ZOSYN) IVPB 3.375 g  Status:  Discontinued     3.375 g 12.5 mL/hr over 240 Minutes Intravenous Every 8 hours 01/08/17 1422 01/15/17 0918   01/08/17 1500  piperacillin-tazobactam (ZOSYN) IVPB 3.375 g     3.375 g 100 mL/hr over 30 Minutes Intravenous  Once 01/08/17 1422 01/08/17 1732   01/06/17 1430  ceFAZolin  (ANCEF) IVPB 1 g/50 mL premix     1 g 100 mL/hr over 30 Minutes Intravenous Every 8 hours 01/06/17 1339 01/07/17 0534   01/06/17 1202  tobramycin (NEBCIN) powder  Status:  Discontinued       As needed 01/06/17 1203 01/06/17 1258   01/06/17 1132  vancomycin (VANCOCIN) powder  Status:  Discontinued       As needed 01/06/17 1133 01/06/17 1258   01/03/17 1500  ceFAZolin (ANCEF) IVPB 1 g/50 mL premix     1 g 100 mL/hr over 30 Minutes Intravenous Every 8 hours 01/03/17 1445 01/05/17 1459   01/03/17 1231  vancomycin (VANCOCIN) powder  Status:  Discontinued       As needed 01/03/17 1231 01/03/17 1431   01/03/17 1000  ceFAZolin (ANCEF) IVPB 2g/100 mL premix     2 g 200 mL/hr over 30 Minutes Intravenous  Once 01/03/17 0946 01/03/17 1118      Best Practice/Protocols:  VTE Prophylaxis: Lovenox (prophylaxtic dose) and Mechanical GI Prophylaxis: Proton Pump Inhibitor Continous Sedation Dilaudid drip will be stopped.  Consults: Treatment Team:  Roby LoftsHaddix, Kevin P, MD    Events:  Subjective:    Overnight Issues: Successfully extubated yesterday. Had fever at 4am today; UA/CXR ordered. Feeling well otherwise; seems to have delirium; is aware that he is in a hospital. Foley removed yesterday, on urecholine, subsequently replaced due to retention. Speech eval pending.  Objective:  Vital signs for last 24 hours: Temp:  [99.7 F (37.6 C)-102.5 F (39.2 C)] 102.4 F (39.1 C) (11/19 0800) Pulse Rate:  [98-131] 117 (11/19 0806) Resp:  [8-30] 13 (11/19 0806) BP: (130-162)/(63-84) 143/73 (11/19 0806) SpO2:  [94 %-100 %] 100 % (11/19 0806) FiO2 (%):  [40 %] 40 % (11/18 1136) Weight:  [100.8 kg (222 lb 3.6 oz)] 100.8 kg (222 lb 3.6 oz) (11/19 0558)  Hemodynamic parameters for last 24 hours:    Intake/Output from previous day: 11/18 0701 - 11/19 0700 In: 1273.3 [I.V.:1213.3; NG/GT:60] Out: 5680 [Urine:5680]  Intake/Output this shift: Total I/O In: 10 [I.V.:10] Out: -   Vent settings for  last 24 hours: FiO2 (%):  [40 %] 40 %  Physical Exam:  General: no respiratory distress; somewhat confused Neuro: nonfocal exam, following commands. HEENT/Neck: no JVD; trachea midline Resp: clear to auscultation bilaterally CVS: sinus tachycardia GI: soft, nontender, nondistended. Extremities: no edema, no erythema, pulses WNL  Results for  orders placed or performed during the hospital encounter of 01/03/17 (from the past 24 hour(s))  I-STAT 3, arterial blood gas (G3+)     Status: Abnormal   Collection Time: 01/19/17 10:34 AM  Result Value Ref Range   pH, Arterial 7.421 7.350 - 7.450   pCO2 arterial 48.0 32.0 - 48.0 mmHg   pO2, Arterial 80.0 (L) 83.0 - 108.0 mmHg   Bicarbonate 31.1 (H) 20.0 - 28.0 mmol/L   TCO2 33 (H) 22 - 32 mmol/L   O2 Saturation 96.0 %   Acid-Base Excess 6.0 (H) 0.0 - 2.0 mmol/L   Patient temperature 99.1 F    Collection site RADIAL, ALLEN'S TEST ACCEPTABLE    Drawn by RT    Sample type ARTERIAL   Glucose, capillary     Status: Abnormal   Collection Time: 01/19/17 11:44 AM  Result Value Ref Range   Glucose-Capillary 227 (H) 65 - 99 mg/dL  Glucose, capillary     Status: Abnormal   Collection Time: 01/19/17  3:28 PM  Result Value Ref Range   Glucose-Capillary 153 (H) 65 - 99 mg/dL  Glucose, capillary     Status: None   Collection Time: 01/19/17  8:00 PM  Result Value Ref Range   Glucose-Capillary 84 65 - 99 mg/dL  Glucose, capillary     Status: None   Collection Time: 01/19/17 11:45 PM  Result Value Ref Range   Glucose-Capillary 79 65 - 99 mg/dL  Glucose, capillary     Status: Abnormal   Collection Time: 01/20/17  3:44 AM  Result Value Ref Range   Glucose-Capillary 108 (H) 65 - 99 mg/dL  Basic metabolic panel     Status: Abnormal   Collection Time: 01/20/17  5:36 AM  Result Value Ref Range   Sodium 150 (H) 135 - 145 mmol/L   Potassium 4.5 3.5 - 5.1 mmol/L   Chloride 114 (H) 101 - 111 mmol/L   CO2 31 22 - 32 mmol/L   Glucose, Bld 121 (H) 65 - 99  mg/dL   BUN 32 (H) 6 - 20 mg/dL   Creatinine, Ser 1.61 0.61 - 1.24 mg/dL   Calcium 8.5 (L) 8.9 - 10.3 mg/dL   GFR calc non Af Amer >60 >60 mL/min   GFR calc Af Amer >60 >60 mL/min   Anion gap 5 5 - 15  CBC with Differential/Platelet     Status: Abnormal   Collection Time: 01/20/17  5:36 AM  Result Value Ref Range   WBC 11.1 (H) 4.0 - 10.5 K/uL   RBC 2.88 (L) 4.22 - 5.81 MIL/uL   Hemoglobin 7.9 (L) 13.0 - 17.0 g/dL   HCT 09.6 (L) 04.5 - 40.9 %   MCV 95.8 78.0 - 100.0 fL   MCH 27.4 26.0 - 34.0 pg   MCHC 28.6 (L) 30.0 - 36.0 g/dL   RDW 81.1 91.4 - 78.2 %   Platelets 343 150 - 400 K/uL   Neutrophils Relative % 80 %   Neutro Abs 9.0 (H) 1.7 - 7.7 K/uL   Lymphocytes Relative 15 %   Lymphs Abs 1.6 0.7 - 4.0 K/uL   Monocytes Relative 4 %   Monocytes Absolute 0.5 0.1 - 1.0 K/uL   Eosinophils Relative 1 %   Eosinophils Absolute 0.1 0.0 - 0.7 K/uL   Basophils Relative 0 %   Basophils Absolute 0.0 0.0 - 0.1 K/uL  Glucose, capillary     Status: Abnormal   Collection Time: 01/20/17  7:49 AM  Result Value Ref  Range   Glucose-Capillary 119 (H) 65 - 99 mg/dL   Comment 1 Notify RN    Comment 2 Document in Chart      Assessment/Plan:   NEURO  Alert, oriented to self and place; following commands   Plan: CPM. Sleep wake cycle, frequent reorientation. Speech therapy.  PULM  CXR today - pending for febrile w/u   Plan:   CARDIO  Sinus Tachycardia   Plan: CPM  RENAL  Not voiding Actue Renal Failure (due to obstruction)   Plan: AKI resolved. FW boluses; monitor hypernatremia. Continue foley for retention; on urecholine; will potentially reattempt trial at voiding tomorrow.  GI  Has been tolerating tube feedings well.   Plan: CPM - ST eval pending.  ID  No known infectious sources   Plan: CXR, UA. Holding off on abx unless source identified.  HEME  Anemia acute blood loss anemia)   Plan: No blood for now.  ENDO Hyperglycemia improved.   Plan: CPM  Global Issues  Weaning but has  a lot of subglottic secretions.  Has cuff leak.  Renal dysfunction has doubled    LOS: 17 days   Additional comments:I reviewed the patient's new clinical lab test results. cbc/bmet and I reviewed the patients new imaging test results. New CXR from today.  Critical Care Total Time*: 30 Minutes  Andria Meuse 01/20/2017  *Care during the described time interval was provided by me and/or other providers on the critical care team.  I have reviewed this patient's available data, including medical history, events of note, physical examination and test results as part of my evaluation.
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
## 2017-01-20 NOTE — Progress Notes (Signed)
Rehab Admissions Coordinator Note:  Patient was screened by Trish MageLogue, Rito Lecomte M for appropriateness for an Inpatient Acute Rehab Consult. Noted SLP recommending inpatient rehab.  Will await PT/OT evaluations and recommendations to determine functional level and rehab needs.  Trish MageLogue, Jarquis Walker M 01/20/2017, 9:57 AM  I can be reached at [phone].
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
## 2017-01-20 NOTE — Progress Notes (Signed)
OT Cancellation    01/20/17 0740  OT Visit Information  Last OT Received On 01/20/17  Reason Eval/Treat Not Completed Patient not medically ready (Active bedrest orders. Will return as schedule allows and awaiting increase in activity orders. Thank you.)   Curlene Dolphinharis Jaretzy Lhommedieu MSOT, OTR/L Acute Rehab Pager: [phone] Office: [phone]
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
## 2017-01-20 NOTE — Progress Notes (Signed)
Orthopaedic Trauma Progress Note  Extubated and following commands  O:  Vitals:   01/20/17 1800 01/20/17 1830  BP: (!) 175/88 (!) 179/90  Pulse:  84  Resp:  (!) 21  Temp:    SpO2:  100%  RLE: Boot in place, Incisions clean, dry and intact. Compartments soft and compressible. Warm and well perfused toes. Does not move toes upon request. Not reliable sensory exam  RUE: Splint clean and dry. Warm and well perfused fingers. Compartments soft and compressible. Able to move fingers to command  A/P: 36 year old male with right open segmental tibia fracture and right ankle fracture s/p IMN/ORIF and right distal radius fracture s/p ORIF  -NWB RLE/RUE -Every other day dressing changes to RLE with adaptic and kerlix -Keep splint in place for likely 2 weeks then will transition to cast/splint  Roby LoftsKevin P. Harjit Leider, MD [facility] [phone] (phone)
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
## 2017-01-20 NOTE — Progress Notes (Signed)
PT Cancellation Note  Patient Details Name: Mark LoseVernell Flatt Jr. MRN: [id number] DOB: 09/14/1980   Cancelled Treatment:    Reason Eval/Treat Not Completed: Medical issues which prohibited therapy(pt continues to have active bedrest orders and await increased activity order)   Mark Mcguire 01/20/2017, 7:18 AM  Mark Mcguire, PT [phone]
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
## 2017-01-20 NOTE — Progress Notes (Signed)
Pt unable to pass FEEs test today.  Spoke with Dr. Fredricka Bonineonnor.  She stated we will wait for SLP to see him again tomorrow and will hold off on the cotrak for now.

## 2017-01-21 ENCOUNTER — Encounter (HOSPITAL_COMMUNITY): Payer: Self-pay | Admitting: Physical Medicine and Rehabilitation

## 2017-01-21 LAB — BASIC METABOLIC PANEL
Anion gap: 7 (ref 5–15)
BUN: 27 mg/dL — ABNORMAL HIGH (ref 6–20)
CO2: 28 mmol/L (ref 22–32)
Calcium: 8.3 mg/dL — ABNORMAL LOW (ref 8.9–10.3)
Chloride: 115 mmol/L — ABNORMAL HIGH (ref 101–111)
Creatinine, Ser: 0.75 mg/dL (ref 0.61–1.24)
GFR calc Af Amer: >60 mL/min (ref >60)
GFR calc non Af Amer: >60 mL/min (ref >60)
Glucose, Bld: 108 mg/dL — ABNORMAL HIGH (ref 65–99)
Potassium: 3.8 mmol/L (ref 3.5–5.1)
Sodium: 150 mmol/L — ABNORMAL HIGH (ref 135–145)

## 2017-01-21 LAB — CBC
HCT: 25.5 % — ABNORMAL LOW (ref 39.0–52.0)
Hemoglobin: 7.7 g/dL — ABNORMAL LOW (ref 13.0–17.0)
MCH: 29.3 pg (ref 26.0–34.0)
MCHC: 30.2 g/dL (ref 30.0–36.0)
MCV: 97 fL (ref 78.0–100.0)
PLATELETS: 327 10*3/uL (ref 150–400)
RBC: 2.63 MIL/uL — ABNORMAL LOW (ref 4.22–5.81)
RDW: 14.9 % (ref 11.5–15.5)
WBC: 9.7 10*3/uL (ref 4.0–10.5)

## 2017-01-21 LAB — GLUCOSE, CAPILLARY
GLUCOSE-CAPILLARY: 122 mg/dL — AB (ref 65–99)
GLUCOSE-CAPILLARY: 259 mg/dL — AB (ref 65–99)
GLUCOSE-CAPILLARY: 183 mg/dL — AB (ref 65–99)
Glucose-Capillary: 71 mg/dL (ref 65–99)
Glucose-Capillary: 180 mg/dL — ABNORMAL HIGH (ref 65–99)
Glucose-Capillary: 74 mg/dL (ref 65–99)

## 2017-01-21 MED ORDER — FREE WATER: 300.0000 mL | Freq: Three times a day (TID) | Status: DC

## 2017-01-21 MED ORDER — SODIUM CHLORIDE 0.45 % IV SOLN
INTRAVENOUS | Status: DC
  Administered 2017-01-22: 09:00:00 via INTRAVENOUS

## 2017-01-21 MED ORDER — ACETAMINOPHEN 325 MG PO TABS
650.0000 mg | ORAL_TABLET | Freq: Four times a day (QID) | ORAL | Status: DC | PRN
  Administered 2017-01-21: 650 mg via ORAL
  Filled 2017-01-21 (×2): qty 2

## 2017-01-21 MED ORDER — RESOURCE THICKENUP CLEAR PO POWD
ORAL | Status: DC | PRN
  Filled 2017-01-21 (×3): qty 125

## 2017-01-21 MED ORDER — IPRATROPIUM-ALBUTEROL 0.5-2.5 (3) MG/3ML IN SOLN
3.0000 mL | Freq: Two times a day (BID) | RESPIRATORY_TRACT | Status: DC
  Administered 2017-01-22: 3 mL via RESPIRATORY_TRACT
  Filled 2017-01-21: qty 3
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
## 2017-01-22 DIAGNOSIS — S12101D Unspecified nondisplaced fracture of second cervical vertebra, subsequent encounter for fracture with routine healing: Secondary | ICD-10-CM

## 2017-01-22 DIAGNOSIS — R000 Tachycardia, unspecified: Secondary | ICD-10-CM

## 2017-01-22 DIAGNOSIS — S82261F Displaced segmental fracture of shaft of right tibia, subsequent encounter for open fracture type IIIA, IIIB, or IIIC with routine healing: Secondary | ICD-10-CM

## 2017-01-22 DIAGNOSIS — R0682 Tachypnea, not elsewhere classified: Secondary | ICD-10-CM

## 2017-01-22 DIAGNOSIS — T148XXA Other injury of unspecified body region, initial encounter: Secondary | ICD-10-CM

## 2017-01-22 DIAGNOSIS — E119 Type 2 diabetes mellitus without complications: Secondary | ICD-10-CM

## 2017-01-22 DIAGNOSIS — E870 Hyperosmolality and hypernatremia: Secondary | ICD-10-CM

## 2017-01-22 DIAGNOSIS — S069X9A Unspecified intracranial injury with loss of consciousness of unspecified duration, initial encounter: Secondary | ICD-10-CM

## 2017-01-22 DIAGNOSIS — I10 Essential (primary) hypertension: Secondary | ICD-10-CM

## 2017-01-22 DIAGNOSIS — T07XXXA Unspecified multiple injuries, initial encounter: Secondary | ICD-10-CM

## 2017-01-22 DIAGNOSIS — D62 Acute posthemorrhagic anemia: Secondary | ICD-10-CM

## 2017-01-22 DIAGNOSIS — L8990 Pressure ulcer of unspecified site, unspecified stage: Secondary | ICD-10-CM

## 2017-01-22 DIAGNOSIS — T1490XA Injury, unspecified, initial encounter: Secondary | ICD-10-CM

## 2017-01-22 DIAGNOSIS — S069X9D Unspecified intracranial injury with loss of consciousness of unspecified duration, subsequent encounter: Secondary | ICD-10-CM

## 2017-01-22 DIAGNOSIS — R1310 Dysphagia, unspecified: Secondary | ICD-10-CM

## 2017-01-22 DIAGNOSIS — D72829 Elevated white blood cell count, unspecified: Secondary | ICD-10-CM

## 2017-01-22 DIAGNOSIS — R739 Hyperglycemia, unspecified: Secondary | ICD-10-CM

## 2017-01-22 LAB — CBC
HCT: 28.3 % — ABNORMAL LOW (ref 39.0–52.0)
Hemoglobin: 8.7 g/dL — ABNORMAL LOW (ref 13.0–17.0)
MCH: 29 pg (ref 26.0–34.0)
MCHC: 30.7 g/dL (ref 30.0–36.0)
MCV: 94.3 fL (ref 78.0–100.0)
PLATELETS: 362 10*3/uL (ref 150–400)
RBC: 3 MIL/uL — ABNORMAL LOW (ref 4.22–5.81)
RDW: 14.4 % (ref 11.5–15.5)
WBC: 11.1 10*3/uL — AB (ref 4.0–10.5)

## 2017-01-22 LAB — GLUCOSE, CAPILLARY
GLUCOSE-CAPILLARY: 107 mg/dL — AB (ref 65–99)
GLUCOSE-CAPILLARY: 211 mg/dL — AB (ref 65–99)
GLUCOSE-CAPILLARY: 152 mg/dL — AB (ref 65–99)
Glucose-Capillary: 61 mg/dL — ABNORMAL LOW (ref 65–99)
Glucose-Capillary: 68 mg/dL (ref 65–99)
Glucose-Capillary: 102 mg/dL — ABNORMAL HIGH (ref 65–99)
Glucose-Capillary: 169 mg/dL — ABNORMAL HIGH (ref 65–99)
Glucose-Capillary: 232 mg/dL — ABNORMAL HIGH (ref 65–99)

## 2017-01-22 LAB — BASIC METABOLIC PANEL
ANION GAP: 6 (ref 5–15)
BUN: 21 mg/dL — AB (ref 6–20)
CALCIUM: 8 mg/dL — AB (ref 8.9–10.3)
CO2: 24 mmol/L (ref 22–32)
Chloride: 117 mmol/L — ABNORMAL HIGH (ref 101–111)
Creatinine, Ser: 0.64 mg/dL (ref 0.61–1.24)
GFR calc Af Amer: >60 mL/min (ref >60)
GLUCOSE: 75 mg/dL (ref 65–99)
Potassium: 3.2 mmol/L — ABNORMAL LOW (ref 3.5–5.1)
Sodium: 147 mmol/L — ABNORMAL HIGH (ref 135–145)

## 2017-01-22 LAB — URINE CULTURE: Culture: NO GROWTH

## 2017-01-22 LAB — PREALBUMIN: Prealbumin: 20.3 mg/dL (ref 18–38)

## 2017-01-22 MED ORDER — IPRATROPIUM-ALBUTEROL 0.5-2.5 (3) MG/3ML IN SOLN: 3.0000 mL | RESPIRATORY_TRACT | Status: DC | PRN

## 2017-01-22 MED ORDER — TAMSULOSIN HCL 0.4 MG PO CAPS
0.4000 mg | ORAL_CAPSULE | Freq: Every day | ORAL | Status: DC
  Administered 2017-01-22 – 2017-01-29 (×8): 0.4 mg via ORAL
  Filled 2017-01-22 (×8): qty 1

## 2017-01-22 MED ORDER — HYDROCODONE-ACETAMINOPHEN 7.5-325 MG/15ML PO SOLN
15.0000 mL | ORAL | Status: DC | PRN
  Administered 2017-01-23 – 2017-01-27 (×12): 15 mL via ORAL
  Filled 2017-01-22 (×15): qty 15

## 2017-01-22 MED ORDER — INSULIN GLARGINE 100 UNIT/ML ~~LOC~~ SOLN
25.0000 [IU] | Freq: Every day | SUBCUTANEOUS | Status: DC
  Administered 2017-01-23 – 2017-01-29 (×7): 25 [IU] via SUBCUTANEOUS
  Filled 2017-01-22 (×7): qty 0.25

## 2017-01-22 NOTE — Progress Notes (Signed)
Pt vomited in bed. Pt was on side, sats unchanged. MD notified. Pt states he did not feel nauseous prior to vomiting. Mouth cleaned with mouthcare kit. Will cont to monitor closely.

## 2017-01-22 NOTE — Progress Notes (Signed)
Orthopaedic Trauma Progress Note  Sitting up in chair this AM.  O:  Vitals:   01/22/17 0300 01/22/17 0400  BP: (!) 175/79 (!) 176/82  Pulse: 96 96  Resp: (!) 25 (!) 24  Temp:  98.8 F (37.1 C)  SpO2: 98% 98%  RLE: Boot in place, Incisions clean, dry and intact. Compartments soft and compressible. Warm and well perfused toes. Unable to actively move foot, sensory is described as intact  RUE: Splint clean and dry. Warm and well perfused fingers. Compartments soft and compressible. Able to move fingers to command  A/P: 36 year old male with right open segmental tibia fracture and right ankle fracture s/p IMN/ORIF and right distal radius fracture s/p ORIF  -NWB RLE/RUE -Remove sutures to RLE, keep area under boot covered -Keep splint in place to RUE for likely 2 weeks then will transition to cast/splint  Roby LoftsKevin P. Shayonna Ocampo, MD [facility] [phone] (phone)

## 2017-01-22 NOTE — Progress Notes (Signed)
I met with pt at bedside and then contacted his Mom, Carlyon Shadow, with his permission. Pt ;lives with Mom and his adult sister, Olivia Mackie. Olivia Mackie and Mom work but Mom had planned to retire in January. She will verify with her job is she could take an FMLA prior to her retirement to provide care for her son in the home. She is preparing to have a ramp built in their home and pt can mobilize in wheelchair once in the home. I will follow up on Friday. [phone]
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
## 2017-01-22 NOTE — Progress Notes (Signed)
Inpatient Diabetes Program Recommendations  AACE/ADA: New Consensus Statement on Inpatient Glycemic Control (2015)  Target Ranges:  Prepandial:   less than 140 mg/dL      Peak postprandial:   less than 180 mg/dL (1-2 hours)      Critically ill patients:  140 - 180 mg/dL   Results for Nicholas LoseOTTEN, Sullivan JR. (MRN [id number]) as of 01/22/2017 10:39  Ref. Range 01/21/2017 19:38 01/21/2017 23:26 01/22/2017 03:23 01/22/2017 03:50 01/22/2017 04:13 01/22/2017 08:44  Glucose-Capillary Latest Ref Range: 65 - 99 mg/dL 629180 (H) 74 61 (L) 68 528107 (H) 102 (H)   Review of Glycemic Control  Inpatient Diabetes Program Recommendations:  Patient has diet ordered and is eating. Patient had hypoglycemia in the 60's at 0400 am due to Q4 hour coverage overnight. Please consider Decreasing frequency of Novolog Correction scale to tid + Novolog HS scale coverage 0-5 units.  Thanks,  Christena DeemShannon Falcon Mccaskey RN, MSN, [facility] Inpatient Diabetes Coordinator Team Pager [phone] (8a-5p)
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
## 2017-01-22 NOTE — Progress Notes (Signed)
Hypoglycemic Event  CBG: 68  Treatment: orange juice  Symptoms: none  Follow-up CBG: Time:0410 CBG Result:107  Possible Reasons for Event:decrease intake  Comments/MD notified:protocol    Amo Kuffour, Alinda MoneyMelvin

## 2017-01-22 NOTE — Progress Notes (Signed)
Trauma Service Note  Subjective: Patient doing well.  Very pleasant and conversant.  Says that he cannot move his right foot.  Objective: Vital signs in last 24 hours: Temp:  [98.8 F (37.1 C)-101.4 F (38.6 C)] 98.8 F (37.1 C) (11/21 0400) Pulse Rate:  [64-143] 96 (11/21 0400) Resp:  [18-47] 24 (11/21 0400) BP: (150-206)/(77-108) 176/82 (11/21 0400) SpO2:  [95 %-100 %] 98 % (11/21 0400) Last BM Date: 01/17/17  Intake/Output from previous day: 11/20 0701 - 11/21 0700 In: 1752.2 [P.O.:480; I.V.:1272.2] Out: 1410 [Urine:1410] Intake/Output this shift: Total I/O In: 1522.5 [P.O.:960; I.V.:562.5] Out: 700 [Urine:700]  General: No acute distress.  Has a sacral ulcer, Stage Mark Mcguire with minimal drainage.  May be Stage II  Lungs: Clear  Abd: Benign.  Has been advanced to dysphagia II diet.  Extremities: No changes.  Canoot move his right foot or toes.  Neuro: Intact  Lab Results: CBC  Recent Labs    01/21/17 0525 01/22/17 0346  WBC 9.7 11.1*  HGB 7.7* 8.7*  HCT 25.5* 28.3*  PLT 327 362   BMET Recent Labs    01/21/17 0525 01/22/17 0346  NA 150* 147*  K 3.8 3.2*  CL 115* 117*  CO2 28 24  GLUCOSE 108* 75  BUN 27* 21*  CREATININE 0.75 0.64  CALCIUM 8.3* 8.0*   PT/INR No results for input(s): LABPROT, INR in the last 72 hours. ABG Recent Labs    01/19/17 1034  PHART 7.421  HCO3 31.1*    Studies/Results: No results found.  Anti-infectives: Anti-infectives (From admission, onward)   Start     Dose/Rate Route Frequency Ordered Stop   01/17/17 0230  ceFAZolin (ANCEF) IVPB 2g/100 mL premix     2 g 200 mL/hr over 30 Minutes Intravenous Every 8 hours 01/16/17 2011 01/17/17 1121   01/16/17 2015  ceFAZolin (ANCEF) IVPB 2g/100 mL premix     2 g 200 mL/hr over 30 Minutes Intravenous NOW 01/16/17 2011 01/16/17 2046   01/16/17 1600  ceFAZolin (ANCEF) IVPB 2g/100 mL premix  Status:  Discontinued     2 g 200 mL/hr over 30 Minutes Intravenous Every 8 hours  01/16/17 1217 01/16/17 2011   01/16/17 0941  vancomycin (VANCOCIN) powder  Status:  Discontinued       As needed 01/16/17 0942 01/16/17 1059   01/08/17 2100  piperacillin-tazobactam (ZOSYN) IVPB 3.375 g  Status:  Discontinued     3.375 g 12.5 mL/hr over 240 Minutes Intravenous Every 8 hours 01/08/17 1422 01/15/17 0918   01/08/17 1500  piperacillin-tazobactam (ZOSYN) IVPB 3.375 g     3.375 g 100 mL/hr over 30 Minutes Intravenous  Once 01/08/17 1422 01/08/17 1732   01/06/17 1430  ceFAZolin (ANCEF) IVPB 1 g/50 mL premix     1 g 100 mL/hr over 30 Minutes Intravenous Every 8 hours 01/06/17 1339 01/07/17 0534   01/06/17 1202  tobramycin (NEBCIN) powder  Status:  Discontinued       As needed 01/06/17 1203 01/06/17 1258   01/06/17 1132  vancomycin (VANCOCIN) powder  Status:  Discontinued       As needed 01/06/17 1133 01/06/17 1258   01/03/17 1500  ceFAZolin (ANCEF) IVPB 1 g/50 mL premix     1 g 100 mL/hr over 30 Minutes Intravenous Every 8 hours 01/03/17 1445 01/05/17 1459   01/03/17 1231  vancomycin (VANCOCIN) powder  Status:  Discontinued       As needed 01/03/17 1231 01/03/17 1431   01/03/17 1000  ceFAZolin (ANCEF)  IVPB 2g/100 mL premix     2 g 200 mL/hr over 30 Minutes Intravenous  Once 01/03/17 0946 01/03/17 1118      Assessment/Plan: s/p Procedure(s): OPEN REDUCTION INTERNAL FIXATION (ORIF) DISTAL RADIAL FRACTURE Advance diet Hypertension is still a problem  LOS: 19 days   Mark LamasJames O. Gae BonWyatt, [name], MD, FACS [phone] Trauma Surgeon 01/22/2017

## 2017-01-22 NOTE — Progress Notes (Signed)
Hypoglycemic Event  CBG: 61  Treatment: orange juice  Symptoms: hungry  Follow-up CBG: Time:0345 CBG Result:68  Possible Reasons for Event: decrease intake  Comments/MD notified:protocol     Amo Kuffour, Alinda MoneyMelvin

## 2017-01-22 NOTE — Consult Note (Signed)
WOC Nurse wound consult note Reason for Consult: MASD vs pressure injury Wound type: Stage 2 Pressure injury Pressure Injury POA: No Measurement: 4cm x 2cm x 0.1cm  Wound bed: red, moist, partial thickness skin loss Drainage (amount, consistency, odor) minimal, serosanguinous  Periwound: intact Dressing procedure/placement/frequency: Soft silicone foam to protect, insulate and absorb drainage. Change every 3 days and PRN soilage. Bedside nurses to assess under dressing each shift for changes.  SPORT mattress in place for moisture management and pressure redistribution. Will need low air loss mattress upon transfer from the ICU  WOC Nurse team will follow along with you for weekly wound assessments.  Please notify me of any acute changes in the wounds or any new areas of concerns Jianni Batten [facility] PLL[facility]ustin MSN, RN,CWOCN, CNS, CWON-AP [phone]

## 2017-01-23 LAB — GLUCOSE, CAPILLARY
GLUCOSE-CAPILLARY: 107 mg/dL — AB (ref 65–99)
GLUCOSE-CAPILLARY: 150 mg/dL — AB (ref 65–99)
Glucose-Capillary: 132 mg/dL — ABNORMAL HIGH (ref 65–99)
Glucose-Capillary: 140 mg/dL — ABNORMAL HIGH (ref 65–99)
Glucose-Capillary: 168 mg/dL — ABNORMAL HIGH (ref 65–99)

## 2017-01-23 MED ORDER — CHLORHEXIDINE GLUCONATE 0.12 % MT SOLN
OROMUCOSAL | Status: AC
  Filled 2017-01-23: qty 15
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
## 2017-01-24 LAB — GLUCOSE, CAPILLARY
GLUCOSE-CAPILLARY: 152 mg/dL — AB (ref 65–99)
GLUCOSE-CAPILLARY: 160 mg/dL — AB (ref 65–99)
GLUCOSE-CAPILLARY: 180 mg/dL — AB (ref 65–99)
GLUCOSE-CAPILLARY: 195 mg/dL — AB (ref 65–99)
Glucose-Capillary: 117 mg/dL — ABNORMAL HIGH (ref 65–99)
Glucose-Capillary: 152 mg/dL — ABNORMAL HIGH (ref 65–99)
Glucose-Capillary: 207 mg/dL — ABNORMAL HIGH (ref 65–99)
Glucose-Capillary: 217 mg/dL — ABNORMAL HIGH (ref 65–99)

## 2017-01-24 MED ORDER — INSULIN ASPART 100 UNIT/ML ~~LOC~~ SOLN
0.0000 [IU] | Freq: Three times a day (TID) | SUBCUTANEOUS | Status: DC
  Administered 2017-01-24: 7 [IU] via SUBCUTANEOUS
  Administered 2017-01-25 (×2): 4 [IU] via SUBCUTANEOUS
  Administered 2017-01-25: 7 [IU] via SUBCUTANEOUS
  Administered 2017-01-26 (×2): 3 [IU] via SUBCUTANEOUS
  Administered 2017-01-26 – 2017-01-27 (×2): 4 [IU] via SUBCUTANEOUS
  Administered 2017-01-27: 3 [IU] via SUBCUTANEOUS
  Administered 2017-01-28 – 2017-01-29 (×4): 4 [IU] via SUBCUTANEOUS
  Administered 2017-01-29: 7 [IU] via SUBCUTANEOUS
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
## 2017-01-24 NOTE — Progress Notes (Signed)
Pt not functionally ready for inpt rehab admission. I will follow up with pt and his Mom on Monday to verify if Mom able to take FMLA and pt's functional progress with therapies. [phone]

## 2017-01-24 NOTE — Progress Notes (Signed)
Patient educated on the importance of wearing the Human resources officerAspen Neck Collar. Pt. Continues to adjust collar and remove. RN replaced collar. RN will continue to monitor.

## 2017-01-24 NOTE — Progress Notes (Signed)
Patient transferred to 4NP11 without s/s of distress. Report given to Elease HashimotoPatricia, Charity fundraiserN. RN and CNA at bedside on arrival to unit. Nursing to continue to monitor.
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
## 2017-01-24 NOTE — Plan of Care (Signed)
Patient continues to think he can get up and walk to bathroom and removing aspen neck collar. RN has provided education to patient. RN will continue to monitor.
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
## 2017-01-25 ENCOUNTER — Encounter (HOSPITAL_COMMUNITY): Payer: Self-pay

## 2017-01-25 LAB — GLUCOSE, CAPILLARY
GLUCOSE-CAPILLARY: 133 mg/dL — AB (ref 65–99)
GLUCOSE-CAPILLARY: 145 mg/dL — AB (ref 65–99)
Glucose-Capillary: 118 mg/dL — ABNORMAL HIGH (ref 65–99)
Glucose-Capillary: 202 mg/dL — ABNORMAL HIGH (ref 65–99)
Glucose-Capillary: 151 mg/dL — ABNORMAL HIGH (ref 65–99)

## 2017-01-25 MED ORDER — ALTEPLASE 2 MG IJ SOLR
2.0000 mg | Freq: Once | INTRAMUSCULAR | Status: AC
  Administered 2017-01-25: 2 mg

## 2017-01-25 MED ORDER — BETHANECHOL CHLORIDE 25 MG PO TABS
25.0000 mg | ORAL_TABLET | Freq: Three times a day (TID) | ORAL | Status: DC
  Administered 2017-01-25 – 2017-01-29 (×11): 25 mg via ORAL
  Filled 2017-01-25 (×11): qty 1

## 2017-01-25 MED ORDER — ASPIRIN 81 MG PO CHEW
324.0000 mg | CHEWABLE_TABLET | Freq: Every day | ORAL | Status: DC
  Administered 2017-01-26 – 2017-01-29 (×4): 324 mg via ORAL
  Filled 2017-01-25 (×4): qty 4

## 2017-01-25 MED ORDER — DOCUSATE SODIUM 50 MG/5ML PO LIQD
100.0000 mg | Freq: Once | ORAL | Status: AC
  Administered 2017-01-25: 100 mg via ORAL
  Filled 2017-01-25: qty 10

## 2017-01-25 NOTE — Progress Notes (Signed)
Patient due to void by 0000 01/26/17. Patient turned to right side with pillow wedge support. Patient denies pain, resting comfortably. RN will continue to monitor patient.

## 2017-01-25 NOTE — Progress Notes (Signed)
SLP Cancellation Note  Patient Details Name: Mark LoseVernell Chisenhall Jr. MRN: [id number] DOB: 06/23/1980   Cancelled treatment:       Reason Eval/Treat Not Completed: Patient at procedure or test/unavailable(getting bath. Will f/u. )  Ferdinand LangoLeah Asiah Browder MA, CCC-SLP [phone]  Seng Fouts Meryl 01/25/2017, 12:15 PM

## 2017-01-25 NOTE — Procedures (Signed)
Dr. Fredricka Bonineonnor was made aware that pt either needs an order for foley cath or to D/C foley cath. Pt had three episodes of urinary retention. Pt is POD #8 is taking urecholine and flomax. Dr. Fredricka Bonineonnor said to keep foley in for now, and this issue will be discussed on rounds this morning.

## 2017-01-25 NOTE — Plan of Care (Signed)
  Safety: Ability to remain free from injury will improve 01/25/2017 0002 - Progressing by Luther Redourgott, Braelyn Jenson, RN   Pain Managment: General experience of comfort will improve 01/25/2017 0002 - Progressing by Luther Redourgott, Mickeal Daws, RN   Physical Regulation: Ability to maintain clinical measurements within normal limits will improve 01/25/2017 0002 - Progressing by Luther Redourgott, Victoriano Campion, RN

## 2017-01-25 NOTE — Progress Notes (Signed)
Per Dr. Sharlene Mottsameriez d/c urinary catheter and closely monitor I/O. RN will continue to monitor patient.
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
## 2017-01-26 ENCOUNTER — Encounter (HOSPITAL_COMMUNITY): Payer: Self-pay | Admitting: *Deleted

## 2017-01-26 ENCOUNTER — Other Ambulatory Visit: Payer: Self-pay

## 2017-01-26 LAB — CBC
HEMATOCRIT: 25.3 % — AB (ref 39.0–52.0)
Hemoglobin: 8.2 g/dL — ABNORMAL LOW (ref 13.0–17.0)
MCH: 29 pg (ref 26.0–34.0)
MCHC: 32.4 g/dL (ref 30.0–36.0)
MCV: 89.4 fL (ref 78.0–100.0)
Platelets: 309 10*3/uL (ref 150–400)
RBC: 2.83 MIL/uL — AB (ref 4.22–5.81)
RDW: 14.4 % (ref 11.5–15.5)
WBC: 9.1 10*3/uL (ref 4.0–10.5)

## 2017-01-26 LAB — GLUCOSE, CAPILLARY
GLUCOSE-CAPILLARY: 199 mg/dL — AB (ref 65–99)
GLUCOSE-CAPILLARY: 146 mg/dL — AB (ref 65–99)
Glucose-Capillary: 148 mg/dL — ABNORMAL HIGH (ref 65–99)
Glucose-Capillary: 147 mg/dL — ABNORMAL HIGH (ref 65–99)

## 2017-01-26 LAB — BASIC METABOLIC PANEL WITH GFR
Anion gap: 5 (ref 5–15)
BUN: 11 mg/dL (ref 6–20)
CO2: 24 mmol/L (ref 22–32)
Calcium: 8.4 mg/dL — ABNORMAL LOW (ref 8.9–10.3)
Chloride: 107 mmol/L (ref 101–111)
Creatinine, Ser: 0.6 mg/dL — ABNORMAL LOW (ref 0.61–1.24)
GFR calc Af Amer: >60 mL/min
GFR calc non Af Amer: >60 mL/min
Glucose, Bld: 178 mg/dL — ABNORMAL HIGH (ref 65–99)
Potassium: 3.6 mmol/L (ref 3.5–5.1)
Sodium: 136 mmol/L (ref 135–145)
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
## 2017-01-26 NOTE — Progress Notes (Signed)
Pt due to void by 0700. Pt bladder scanned at 0500 and showed . Pt had not voided by 0630 and bladder scanned again. Bladder scan showed . Will continue to monitor and report to oncoming nurse.
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
## 2017-01-26 NOTE — Progress Notes (Signed)
Pt due to void by 0000. Pt bladder scanned at 2230, bladder scan showed . Pt had not voided and was bladder scanned again at 0045 and showed . RN did in & out per protocol and obtained . Will continue to monitor throughout shift and bladder scan again in 4 hrs.

## 2017-01-26 NOTE — Progress Notes (Signed)
   01/26/17 1351  Clinical Encounter Type  Visited With Patient  Visit Type Initial  Referral From Nurse  Consult/Referral To Chaplain  Spiritual Encounters  Spiritual Needs Emotional;Prayer  Stress Factors  Patient Stress Factors (sense of wanting to get better and to get home)   Responding to a [facility] to visit with the patient.  Patient feels alone, misses his family and his home.  He is frustrated he can't remember what happened.  He is frustrated he is not better yet and wants get home.  He expressed some hope about going to rehab soon to start getting better.  Says he has a hard time sleeping with so much on his mind like, what happened, missing his family, when will he go home.  I helped him look at this one day at a time and to focus on the next step of Rehab.  We prayed together.  In the end I believe the patient really just wanted some one to listen to his story of being here and support him. Will follow as needed. Chaplain Agustin CreeNewton Tayshon Winker

## 2017-01-27 ENCOUNTER — Encounter (HOSPITAL_COMMUNITY): Payer: Self-pay | Admitting: *Deleted

## 2017-01-27 DIAGNOSIS — E118 Type 2 diabetes mellitus with unspecified complications: Secondary | ICD-10-CM

## 2017-01-27 DIAGNOSIS — E785 Hyperlipidemia, unspecified: Secondary | ICD-10-CM

## 2017-01-27 DIAGNOSIS — S299XXD Unspecified injury of thorax, subsequent encounter: Secondary | ICD-10-CM

## 2017-01-27 LAB — GLUCOSE, CAPILLARY
GLUCOSE-CAPILLARY: 143 mg/dL — AB (ref 65–99)
Glucose-Capillary: 181 mg/dL — ABNORMAL HIGH (ref 65–99)
Glucose-Capillary: 150 mg/dL — ABNORMAL HIGH (ref 65–99)
Glucose-Capillary: 106 mg/dL — ABNORMAL HIGH (ref 65–99)

## 2017-01-27 MED ORDER — HYDROCHLOROTHIAZIDE 25 MG PO TABS
25.0000 mg | ORAL_TABLET | Freq: Every day | ORAL | Status: DC
  Administered 2017-01-27 – 2017-01-29 (×3): 25 mg via ORAL
  Filled 2017-01-27 (×3): qty 1

## 2017-01-27 MED ORDER — HYDRALAZINE HCL 20 MG/ML IJ SOLN
10.0000 mg | Freq: Four times a day (QID) | INTRAMUSCULAR | Status: DC | PRN
  Administered 2017-01-27: 10 mg via INTRAVENOUS
  Filled 2017-01-27 (×2): qty 1

## 2017-01-27 MED ORDER — LISINOPRIL 20 MG PO TABS
20.0000 mg | ORAL_TABLET | Freq: Every day | ORAL | Status: DC
  Administered 2017-01-27 – 2017-01-29 (×3): 20 mg via ORAL
  Filled 2017-01-27 (×3): qty 1

## 2017-01-27 MED ORDER — SIMVASTATIN 20 MG PO TABS
10.0000 mg | ORAL_TABLET | Freq: Every day | ORAL | Status: DC
  Administered 2017-01-27 – 2017-01-28 (×2): 10 mg via ORAL
  Filled 2017-01-27 (×2): qty 1

## 2017-01-27 MED ORDER — GLUCERNA SHAKE PO LIQD
237.0000 mL | Freq: Two times a day (BID) | ORAL | Status: DC
  Administered 2017-01-28 – 2017-01-29 (×4): 237 mL via ORAL

## 2017-01-27 NOTE — Progress Notes (Signed)
Orthopedic Tech Progress Note Patient Details:  Mark LoseVernell Baltzell Jr. 09/24/1980 [id number]  Patient ID: Mark LoseVernell Scorsone Jr., male   DOB: 04/20/1980, 36 y.o.   MRN: [id number]   [name] 01/27/2017, 12:28 PM Soft collar
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
## 2017-01-27 NOTE — Progress Notes (Signed)
Patient c/o discomfort with Aspen collar while in bed. Writer spoke with both Neurosurgery and Trauma MD and agreed to have Soft collar on while in bed and Aspen collar on when out of bed. Order carried out.

## 2017-01-27 NOTE — Progress Notes (Signed)
Received return call from pt's Mom. She has not confirmed 24/7 caregiver support at home. I explained that I could not admit pt to CIR without confirmed intention to have pt d/c home with 24/7 caregiver support after a CIR stay. She asks if he were to go to SNF, location of SNF. I explained that limited facilities without medical insurance. She is to discuss with family and work and let me know. [phone]
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
## 2017-01-27 NOTE — Progress Notes (Signed)
I have left a message with pt's sister to have her Mom contact me today to follow up on caregiver support clarification from our discussions last week. [phone]

## 2017-01-28 DIAGNOSIS — S52501A Unspecified fracture of the lower end of right radius, initial encounter for closed fracture: Secondary | ICD-10-CM

## 2017-01-28 DIAGNOSIS — S52601A Unspecified fracture of lower end of right ulna, initial encounter for closed fracture: Secondary | ICD-10-CM

## 2017-01-28 LAB — GLUCOSE, CAPILLARY
GLUCOSE-CAPILLARY: 187 mg/dL — AB (ref 65–99)
GLUCOSE-CAPILLARY: 147 mg/dL — AB (ref 65–99)
Glucose-Capillary: 190 mg/dL — ABNORMAL HIGH (ref 65–99)
Glucose-Capillary: 188 mg/dL — ABNORMAL HIGH (ref 65–99)

## 2017-01-28 LAB — LIPID PANEL
Cholesterol: 165 mg/dL (ref 0–200)
HDL: 27 mg/dL — ABNORMAL LOW (ref >40)
LDL Cholesterol: 113 mg/dL — ABNORMAL HIGH (ref 0–99)
TRIGLYCERIDES: 124 mg/dL (ref <150)
Total CHOL/HDL Ratio: 6.1 RATIO
VLDL: 25 mg/dL (ref 0–40)

## 2017-01-28 MED ORDER — HYDRALAZINE HCL 20 MG/ML IJ SOLN
10.0000 mg | Freq: Four times a day (QID) | INTRAMUSCULAR | Status: DC | PRN
  Administered 2017-01-29: 10 mg via INTRAMUSCULAR
  Filled 2017-01-28: qty 1

## 2017-01-28 MED ORDER — HYDRALAZINE HCL 20 MG/ML IJ SOLN
20.0000 mg | INTRAMUSCULAR | Status: DC | PRN
  Administered 2017-01-28 (×2): 20 mg via INTRAVENOUS
  Filled 2017-01-28: qty 1

## 2017-01-28 MED ORDER — HYDROCODONE-ACETAMINOPHEN 7.5-325 MG/15ML PO SOLN
10.0000 mL | ORAL | Status: DC | PRN
  Administered 2017-01-28 – 2017-01-29 (×2): 10 mL via ORAL
  Filled 2017-01-28 (×2): qty 15
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
## 2017-01-28 NOTE — Progress Notes (Signed)
I received call from pt's Mom and she will take FMLA to care for her son after CIR admit. I spoke with pt at bedside again to explain the process for admit tomorrow with a probable length of stay of 2 weeks and then d/c home with family. I will follow up in the morning to arrange, [phone]
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
## 2017-01-28 NOTE — Progress Notes (Signed)
2000: Received handoff report from RN. Pt resting comfortably. Helped to reposition pt onto his side. Discussed goals of care (rest, repositioning, passing BM). Pt amenable to plan. Pt's main concern is knowing a timeline to d/c. He seems to have some guilt about frequent assistance requests. I assured him that if he needed something, he should call, and we would do our best to accommodate.  2300: Handoff report given to oncoming RN. No acute events for the first part of the shift.

## 2017-01-29 ENCOUNTER — Encounter (HOSPITAL_COMMUNITY): Payer: Self-pay

## 2017-01-29 ENCOUNTER — Other Ambulatory Visit: Payer: Self-pay

## 2017-01-29 ENCOUNTER — Inpatient Hospital Stay (HOSPITAL_COMMUNITY): Payer: Self-pay

## 2017-01-29 ENCOUNTER — Inpatient Hospital Stay (HOSPITAL_COMMUNITY)
Admission: RE | Admit: 2017-01-29 | Discharge: 2017-02-18 | DRG: 945 | Disposition: A | Discharge status: Home Health | Payer: No Typology Code available for payment source | Source: Intra-hospital | Attending: Physical Medicine & Rehabilitation | Admitting: Physical Medicine & Rehabilitation

## 2017-01-29 DIAGNOSIS — S12101S Unspecified nondisplaced fracture of second cervical vertebra, sequela: Secondary | ICD-10-CM

## 2017-01-29 DIAGNOSIS — Z833 Family history of diabetes mellitus: Secondary | ICD-10-CM

## 2017-01-29 DIAGNOSIS — M109 Gout, unspecified: Secondary | ICD-10-CM | POA: Diagnosis present

## 2017-01-29 DIAGNOSIS — Y92410 Unspecified street and highway as the place of occurrence of the external cause: Secondary | ICD-10-CM | POA: Diagnosis not present

## 2017-01-29 DIAGNOSIS — S29011D Strain of muscle and tendon of front wall of thorax, subsequent encounter: Secondary | ICD-10-CM

## 2017-01-29 DIAGNOSIS — R339 Retention of urine, unspecified: Secondary | ICD-10-CM | POA: Diagnosis present

## 2017-01-29 DIAGNOSIS — S069X9A Unspecified intracranial injury with loss of consciousness of unspecified duration, initial encounter: Secondary | ICD-10-CM | POA: Diagnosis present

## 2017-01-29 DIAGNOSIS — R1310 Dysphagia, unspecified: Secondary | ICD-10-CM | POA: Diagnosis present

## 2017-01-29 DIAGNOSIS — S069X9S Unspecified intracranial injury with loss of consciousness of unspecified duration, sequela: Secondary | ICD-10-CM

## 2017-01-29 DIAGNOSIS — Z803 Family history of malignant neoplasm of breast: Secondary | ICD-10-CM

## 2017-01-29 DIAGNOSIS — D62 Acute posthemorrhagic anemia: Secondary | ICD-10-CM | POA: Diagnosis present

## 2017-01-29 DIAGNOSIS — S069XAA Unspecified intracranial injury with loss of consciousness status unknown, initial encounter: Secondary | ICD-10-CM

## 2017-01-29 DIAGNOSIS — K5900 Constipation, unspecified: Secondary | ICD-10-CM | POA: Diagnosis present

## 2017-01-29 DIAGNOSIS — S12100A Unspecified displaced fracture of second cervical vertebra, initial encounter for closed fracture: Secondary | ICD-10-CM | POA: Diagnosis present

## 2017-01-29 DIAGNOSIS — S82261E Displaced segmental fracture of shaft of right tibia, subsequent encounter for open fracture type I or II with routine healing: Secondary | ICD-10-CM | POA: Diagnosis not present

## 2017-01-29 DIAGNOSIS — S52501D Unspecified fracture of the lower end of right radius, subsequent encounter for closed fracture with routine healing: Secondary | ICD-10-CM

## 2017-01-29 DIAGNOSIS — D72829 Elevated white blood cell count, unspecified: Secondary | ICD-10-CM | POA: Diagnosis present

## 2017-01-29 DIAGNOSIS — S82841D Displaced bimalleolar fracture of right lower leg, subsequent encounter for closed fracture with routine healing: Secondary | ICD-10-CM

## 2017-01-29 DIAGNOSIS — J80 Acute respiratory distress syndrome: Secondary | ICD-10-CM

## 2017-01-29 DIAGNOSIS — E11649 Type 2 diabetes mellitus with hypoglycemia without coma: Secondary | ICD-10-CM | POA: Diagnosis present

## 2017-01-29 DIAGNOSIS — Z87891 Personal history of nicotine dependence: Secondary | ICD-10-CM

## 2017-01-29 DIAGNOSIS — S066X9D Traumatic subarachnoid hemorrhage with loss of consciousness of unspecified duration, subsequent encounter: Secondary | ICD-10-CM | POA: Diagnosis present

## 2017-01-29 DIAGNOSIS — S12101D Unspecified nondisplaced fracture of second cervical vertebra, subsequent encounter for fracture with routine healing: Secondary | ICD-10-CM | POA: Diagnosis not present

## 2017-01-29 DIAGNOSIS — S82261S Displaced segmental fracture of shaft of right tibia, sequela: Secondary | ICD-10-CM

## 2017-01-29 DIAGNOSIS — E119 Type 2 diabetes mellitus without complications: Secondary | ICD-10-CM

## 2017-01-29 DIAGNOSIS — R4189 Other symptoms and signs involving cognitive functions and awareness: Secondary | ICD-10-CM | POA: Diagnosis present

## 2017-01-29 DIAGNOSIS — T148XXA Other injury of unspecified body region, initial encounter: Secondary | ICD-10-CM

## 2017-01-29 DIAGNOSIS — E162 Hypoglycemia, unspecified: Secondary | ICD-10-CM

## 2017-01-29 DIAGNOSIS — I10 Essential (primary) hypertension: Secondary | ICD-10-CM | POA: Diagnosis present

## 2017-01-29 HISTORY — DX: Unspecified intracranial injury with loss of consciousness status unknown, initial encounter: S06.9XAA

## 2017-01-29 LAB — GLUCOSE, CAPILLARY
GLUCOSE-CAPILLARY: 227 mg/dL — AB (ref 65–99)
GLUCOSE-CAPILLARY: 177 mg/dL — AB (ref 65–99)
GLUCOSE-CAPILLARY: 171 mg/dL — AB (ref 65–99)
Glucose-Capillary: 167 mg/dL — ABNORMAL HIGH (ref 65–99)

## 2017-01-29 LAB — BASIC METABOLIC PANEL
ANION GAP: 7 (ref 5–15)
BUN: 10 mg/dL (ref 6–20)
CHLORIDE: 104 mmol/L (ref 101–111)
CO2: 24 mmol/L (ref 22–32)
Calcium: 9.1 mg/dL (ref 8.9–10.3)
Creatinine, Ser: 0.56 mg/dL — ABNORMAL LOW (ref 0.61–1.24)
GFR calc Af Amer: >60 mL/min (ref >60)
Glucose, Bld: 217 mg/dL — ABNORMAL HIGH (ref 65–99)
POTASSIUM: 3.7 mmol/L (ref 3.5–5.1)
Sodium: 135 mmol/L (ref 135–145)

## 2017-01-29 LAB — CBC
HEMATOCRIT: 32 % — AB (ref 39.0–52.0)
HEMOGLOBIN: 10.1 g/dL — AB (ref 13.0–17.0)
MCH: 28.4 pg (ref 26.0–34.0)
MCHC: 31.6 g/dL (ref 30.0–36.0)
MCV: 89.9 fL (ref 78.0–100.0)
PLATELETS: 300 10*3/uL (ref 150–400)
RBC: 3.56 MIL/uL — AB (ref 4.22–5.81)
RDW: 15.1 % (ref 11.5–15.5)
WBC: 9.2 10*3/uL (ref 4.0–10.5)

## 2017-01-29 MED ORDER — PROCHLORPERAZINE 25 MG RE SUPP
12.5000 mg | Freq: Four times a day (QID) | RECTAL | Status: DC | PRN
  Filled 2017-01-29: qty 1

## 2017-01-29 MED ORDER — ENOXAPARIN SODIUM 40 MG/0.4ML ~~LOC~~ SOLN
40.0000 mg | SUBCUTANEOUS | Status: DC
  Administered 2017-01-29 – 2017-01-30 (×2): 40 mg via SUBCUTANEOUS
  Filled 2017-01-29 (×2): qty 0.4

## 2017-01-29 MED ORDER — DIPHENHYDRAMINE HCL 12.5 MG/5ML PO ELIX
12.5000 mg | ORAL_SOLUTION | Freq: Four times a day (QID) | ORAL | Status: DC | PRN
  Administered 2017-01-30 – 2017-02-01 (×2): 25 mg via ORAL
  Filled 2017-01-29 (×2): qty 10

## 2017-01-29 MED ORDER — BISACODYL 10 MG RE SUPP
10.0000 mg | Freq: Every day | RECTAL | Status: DC | PRN
  Administered 2017-02-01 – 2017-02-08 (×3): 10 mg via RECTAL
  Filled 2017-01-29 (×2): qty 1

## 2017-01-29 MED ORDER — SODIUM CHLORIDE 0.9% FLUSH
10.0000 mL | INTRAVENOUS | Status: DC | PRN
  Administered 2017-01-31 (×2): 30 mL
  Filled 2017-01-29 (×2): qty 40

## 2017-01-29 MED ORDER — SODIUM CHLORIDE 0.9% FLUSH
10.0000 mL | Freq: Two times a day (BID) | INTRAVENOUS | Status: DC
  Administered 2017-01-30 – 2017-02-02 (×4): 10 mL
  Administered 2017-02-04: 30 mL

## 2017-01-29 MED ORDER — ACETAMINOPHEN 325 MG PO TABS
325.0000 mg | ORAL_TABLET | ORAL | Status: DC | PRN
  Administered 2017-01-30 – 2017-02-17 (×2): 650 mg via ORAL
  Filled 2017-01-29 (×2): qty 2

## 2017-01-29 MED ORDER — HYDROCODONE-ACETAMINOPHEN 10-325 MG PO TABS
1.0000 | ORAL_TABLET | Freq: Four times a day (QID) | ORAL | Status: DC | PRN
  Administered 2017-01-30 – 2017-02-12 (×10): 1 via ORAL
  Filled 2017-01-29 (×13): qty 1

## 2017-01-29 MED ORDER — FLEET ENEMA 7-19 GM/118ML RE ENEM: 1.0000 | ENEMA | Freq: Once | RECTAL | Status: DC | PRN

## 2017-01-29 MED ORDER — SIMVASTATIN 20 MG PO TABS
10.0000 mg | ORAL_TABLET | Freq: Every day | ORAL | Status: DC
  Administered 2017-01-30 – 2017-02-17 (×19): 10 mg via ORAL
  Filled 2017-01-29 (×19): qty 1

## 2017-01-29 MED ORDER — PANTOPRAZOLE SODIUM 40 MG PO TBEC
40.0000 mg | DELAYED_RELEASE_TABLET | Freq: Every day | ORAL | Status: DC
  Administered 2017-01-30 – 2017-02-18 (×20): 40 mg via ORAL
  Filled 2017-01-29 (×20): qty 1

## 2017-01-29 MED ORDER — METOPROLOL TARTRATE 50 MG PO TABS
125.0000 mg | ORAL_TABLET | Freq: Two times a day (BID) | ORAL | Status: DC
  Administered 2017-01-29 – 2017-02-04 (×13): 125 mg via ORAL
  Filled 2017-01-29: qty 1
  Filled 2017-01-29 (×3): qty 2
  Filled 2017-01-29 (×2): qty 1
  Filled 2017-01-29 (×3): qty 2
  Filled 2017-01-29 (×6): qty 1

## 2017-01-29 MED ORDER — INSULIN GLARGINE 100 UNIT/ML ~~LOC~~ SOLN
25.0000 [IU] | Freq: Every day | SUBCUTANEOUS | Status: DC
  Administered 2017-01-30 – 2017-01-31 (×2): 25 [IU] via SUBCUTANEOUS
  Filled 2017-01-29 (×2): qty 0.25

## 2017-01-29 MED ORDER — POLYETHYLENE GLYCOL 3350 17 G PO PACK
17.0000 g | PACK | Freq: Every day | ORAL | Status: DC | PRN
  Administered 2017-01-31 – 2017-02-07 (×2): 17 g via ORAL
  Filled 2017-01-29 (×2): qty 1

## 2017-01-29 MED ORDER — INSULIN ASPART 100 UNIT/ML ~~LOC~~ SOLN
0.0000 [IU] | Freq: Three times a day (TID) | SUBCUTANEOUS | Status: DC
  Administered 2017-01-30: 4 [IU] via SUBCUTANEOUS
  Administered 2017-01-30: 3 [IU] via SUBCUTANEOUS
  Administered 2017-01-31 – 2017-02-01 (×4): 4 [IU] via SUBCUTANEOUS
  Administered 2017-02-01: 3 [IU] via SUBCUTANEOUS
  Administered 2017-02-02 – 2017-02-04 (×3): 4 [IU] via SUBCUTANEOUS
  Administered 2017-02-05: 3 [IU] via SUBCUTANEOUS
  Administered 2017-02-08: 4 [IU] via SUBCUTANEOUS
  Administered 2017-02-08: 3 [IU] via SUBCUTANEOUS
  Administered 2017-02-09 – 2017-02-10 (×2): 4 [IU] via SUBCUTANEOUS
  Administered 2017-02-10 – 2017-02-11 (×2): 3 [IU] via SUBCUTANEOUS
  Administered 2017-02-12: 4 [IU] via SUBCUTANEOUS
  Administered 2017-02-12 – 2017-02-13 (×2): 3 [IU] via SUBCUTANEOUS
  Administered 2017-02-14: 4 [IU] via SUBCUTANEOUS
  Administered 2017-02-15 – 2017-02-17 (×2): 3 [IU] via SUBCUTANEOUS

## 2017-01-29 MED ORDER — TRAMADOL HCL 50 MG PO TABS
50.0000 mg | ORAL_TABLET | ORAL | Status: DC | PRN
  Administered 2017-01-29 – 2017-02-17 (×8): 50 mg via ORAL
  Filled 2017-01-29 (×8): qty 1

## 2017-01-29 MED ORDER — GUAIFENESIN-DM 100-10 MG/5ML PO SYRP: 5.0000 mL | ORAL_SOLUTION | Freq: Four times a day (QID) | ORAL | Status: DC | PRN

## 2017-01-29 MED ORDER — BISACODYL 10 MG RE SUPP
10.0000 mg | Freq: Every day | RECTAL | Status: DC | PRN
  Filled 2017-01-29: qty 1

## 2017-01-29 MED ORDER — TRAZODONE HCL 50 MG PO TABS
25.0000 mg | ORAL_TABLET | Freq: Every evening | ORAL | Status: DC | PRN
  Administered 2017-01-29 – 2017-02-17 (×17): 50 mg via ORAL
  Filled 2017-01-29 (×19): qty 1

## 2017-01-29 MED ORDER — HYDROCHLOROTHIAZIDE 25 MG PO TABS
25.0000 mg | ORAL_TABLET | Freq: Every day | ORAL | Status: DC
  Administered 2017-01-30 – 2017-02-18 (×20): 25 mg via ORAL
  Filled 2017-01-29 (×20): qty 1

## 2017-01-29 MED ORDER — LIDOCAINE HCL 2 % EX GEL: CUTANEOUS | Status: DC | PRN

## 2017-01-29 MED ORDER — LISINOPRIL 20 MG PO TABS
20.0000 mg | ORAL_TABLET | Freq: Every day | ORAL | Status: DC
  Administered 2017-01-30 – 2017-02-18 (×20): 20 mg via ORAL
  Filled 2017-01-29 (×20): qty 1

## 2017-01-29 MED ORDER — ALUM & MAG HYDROXIDE-SIMETH 200-200-20 MG/5ML PO SUSP: 30.0000 mL | ORAL | Status: DC | PRN

## 2017-01-29 MED ORDER — ASPIRIN 81 MG PO CHEW
324.0000 mg | CHEWABLE_TABLET | Freq: Every day | ORAL | Status: DC
  Administered 2017-01-30 – 2017-02-06 (×8): 324 mg via ORAL
  Filled 2017-01-29 (×8): qty 4

## 2017-01-29 MED ORDER — PROCHLORPERAZINE EDISYLATE 5 MG/ML IJ SOLN: 5.0000 mg | Freq: Four times a day (QID) | INTRAMUSCULAR | Status: DC | PRN

## 2017-01-29 MED ORDER — IPRATROPIUM-ALBUTEROL 0.5-2.5 (3) MG/3ML IN SOLN: 3.0000 mL | RESPIRATORY_TRACT | Status: DC | PRN

## 2017-01-29 MED ORDER — BETHANECHOL CHLORIDE 25 MG PO TABS
25.0000 mg | ORAL_TABLET | Freq: Three times a day (TID) | ORAL | Status: DC
  Administered 2017-01-29 – 2017-02-07 (×26): 25 mg via ORAL
  Filled 2017-01-29 (×26): qty 1

## 2017-01-29 MED ORDER — TAMSULOSIN HCL 0.4 MG PO CAPS
0.4000 mg | ORAL_CAPSULE | Freq: Every day | ORAL | Status: DC
  Administered 2017-01-30 – 2017-02-18 (×20): 0.4 mg via ORAL
  Filled 2017-01-29 (×20): qty 1

## 2017-01-29 MED ORDER — PROCHLORPERAZINE MALEATE 5 MG PO TABS: 5.0000 mg | ORAL_TABLET | Freq: Four times a day (QID) | ORAL | Status: DC | PRN
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
## 2017-01-29 NOTE — Plan of Care (Signed)
Patient is being [facility].

## 2017-01-29 NOTE — Clinical Social Work Note (Signed)
Clinical Social Worker continuing to follow patient and family for support and discharge planning needs.  Patient has been accepted to inpatient rehab with plans to transfer today.  Clinical Social Worker will sign off for now as social work intervention is no longer needed. Please consult us again if new need arises.  Mark Mcguire, [place] [phone]
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
## 2017-01-29 NOTE — Progress Notes (Signed)
To room 13 on air mattress bed. Alert and oriented. Oriented to IP Rehab. Family with pt.
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
## 2017-01-30 ENCOUNTER — Inpatient Hospital Stay (HOSPITAL_COMMUNITY): Payer: No Typology Code available for payment source

## 2017-01-30 ENCOUNTER — Inpatient Hospital Stay (HOSPITAL_COMMUNITY): Payer: Self-pay

## 2017-01-30 ENCOUNTER — Other Ambulatory Visit: Payer: Self-pay

## 2017-01-30 ENCOUNTER — Inpatient Hospital Stay (HOSPITAL_COMMUNITY): Payer: No Typology Code available for payment source | Admitting: Speech Pathology

## 2017-01-30 ENCOUNTER — Inpatient Hospital Stay (HOSPITAL_COMMUNITY): Payer: No Typology Code available for payment source | Admitting: Occupational Therapy

## 2017-01-30 DIAGNOSIS — M7989 Other specified soft tissue disorders: Secondary | ICD-10-CM

## 2017-01-30 DIAGNOSIS — E1165 Type 2 diabetes mellitus with hyperglycemia: Secondary | ICD-10-CM

## 2017-01-30 DIAGNOSIS — S069X3S Unspecified intracranial injury with loss of consciousness of 1 hour to 5 hours 59 minutes, sequela: Secondary | ICD-10-CM

## 2017-01-30 DIAGNOSIS — Z8781 Personal history of (healed) traumatic fracture: Secondary | ICD-10-CM

## 2017-01-30 LAB — COMPREHENSIVE METABOLIC PANEL
ALK PHOS: 181 U/L — AB (ref 38–126)
ALT: 58 U/L (ref 17–63)
AST: 22 U/L (ref 15–41)
Albumin: 3 g/dL — ABNORMAL LOW (ref 3.5–5.0)
Anion gap: 7 (ref 5–15)
BILIRUBIN TOTAL: 0.4 mg/dL (ref 0.3–1.2)
BUN: 10 mg/dL (ref 6–20)
CALCIUM: 9.2 mg/dL (ref 8.9–10.3)
CHLORIDE: 103 mmol/L (ref 101–111)
CO2: 26 mmol/L (ref 22–32)
CREATININE: 0.59 mg/dL — AB (ref 0.61–1.24)
Glucose, Bld: 179 mg/dL — ABNORMAL HIGH (ref 65–99)
Potassium: 3.9 mmol/L (ref 3.5–5.1)
Sodium: 136 mmol/L (ref 135–145)
Total Protein: 5.7 g/dL — ABNORMAL LOW (ref 6.5–8.1)

## 2017-01-30 LAB — GLUCOSE, CAPILLARY
GLUCOSE-CAPILLARY: 155 mg/dL — AB (ref 65–99)
Glucose-Capillary: 110 mg/dL — ABNORMAL HIGH (ref 65–99)
Glucose-Capillary: 141 mg/dL — ABNORMAL HIGH (ref 65–99)
Glucose-Capillary: 206 mg/dL — ABNORMAL HIGH (ref 65–99)

## 2017-01-30 LAB — CBC WITH DIFFERENTIAL/PLATELET
BASOS ABS: 0 10*3/uL (ref 0.0–0.1)
Basophils Relative: 0 %
Eosinophils Absolute: 0.1 10*3/uL (ref 0.0–0.7)
Eosinophils Relative: 1 %
HEMATOCRIT: 30.6 % — AB (ref 39.0–52.0)
HEMOGLOBIN: 9.7 g/dL — AB (ref 13.0–17.0)
LYMPHS ABS: 3.3 10*3/uL (ref 0.7–4.0)
Lymphocytes Relative: 37 %
MCH: 28.7 pg (ref 26.0–34.0)
MCHC: 31.7 g/dL (ref 30.0–36.0)
MCV: 90.5 fL (ref 78.0–100.0)
Monocytes Absolute: 0.6 10*3/uL (ref 0.1–1.0)
Monocytes Relative: 7 %
NEUTROS ABS: 4.8 10*3/uL (ref 1.7–7.7)
NEUTROS PCT: 55 %
PLATELETS: 283 10*3/uL (ref 150–400)
RBC: 3.38 MIL/uL — ABNORMAL LOW (ref 4.22–5.81)
RDW: 15.1 % (ref 11.5–15.5)
WBC: 8.8 10*3/uL (ref 4.0–10.5)

## 2017-01-30 MED ORDER — COLLAGENASE 250 UNIT/GM EX OINT
TOPICAL_OINTMENT | Freq: Every day | CUTANEOUS | Status: DC
  Administered 2017-01-30 – 2017-02-13 (×15): via TOPICAL
  Filled 2017-01-30: qty 30

## 2017-01-30 NOTE — Progress Notes (Signed)
Physical Therapy Note  Patient Details  Name: Mark LoseVernell Belmonte Jr. MRN: [id number] Date of Birth: 02/27/1981 Today's Date: 01/30/2017   PT eval deferred at this time due to medical status. Pt with + BLE DVTs with Bedrest orders. Clarified with PA, [name] and pt only able to sit with HOB elevated but no EOB at this time. Will defer PT eval until bedrest lifted. Discussed this with pt and obtained w/c and PFRW in in preparation for tomorrow. Pt missed 75 min of skilled PT.    [name], [name]  [name], PT, DPT  01/30/2017, 1:50 PM
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
## 2017-01-30 NOTE — Consult Note (Signed)
WOC Nurse wound follow up Wound type: Stage 2 Pressure Injury; sacrum Measurement: 4cm x 2.5cm x 0.2cm  Wound bed:95% yellow, with some epithelial buds  Drainage (amount, consistency, odor) minimal, no odor Periwound: intact  Dressing procedure/placement/frequency: Add enzymatic debridement ointment, cover with saline moist 2x2 gauze. Top with foam. Change gauze and reapply Santyl daily.   WOC Nurse team will follow along with you for weekly wound assessments.  Please notify me of any acute changes in the wounds or any new areas of concerns Zaineb Nowaczyk [facility] MSN, RN,CWOCN, CNS, CWON-AP [phone]
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
## 2017-01-30 NOTE — Progress Notes (Signed)
Preliminary results by tech - Venous Duplex Lower Ext. Completed. Positive for acute/subacute deep vein thrombosis involving the right and left common femoral veins, right femoral vein, and right popliteal veins. The right and left iliac veins appear patent with no evidence of thrombus. The IVC was not clearly visualized to exclude thrombus. Results given to [facility]anie, patient's nurse.  Marilynne Halstedita Mazie Fencl, BS, RDMS, RVT
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
## 2017-01-31 ENCOUNTER — Inpatient Hospital Stay (HOSPITAL_COMMUNITY): Payer: Self-pay | Admitting: Speech Pathology

## 2017-01-31 ENCOUNTER — Inpatient Hospital Stay (HOSPITAL_COMMUNITY): Payer: No Typology Code available for payment source

## 2017-01-31 ENCOUNTER — Inpatient Hospital Stay (HOSPITAL_COMMUNITY): Payer: Self-pay

## 2017-01-31 ENCOUNTER — Inpatient Hospital Stay (HOSPITAL_COMMUNITY): Payer: Self-pay | Admitting: Occupational Therapy

## 2017-01-31 LAB — GLUCOSE, CAPILLARY
GLUCOSE-CAPILLARY: 191 mg/dL — AB (ref 65–99)
GLUCOSE-CAPILLARY: 162 mg/dL — AB (ref 65–99)
GLUCOSE-CAPILLARY: 118 mg/dL — AB (ref 65–99)
Glucose-Capillary: 161 mg/dL — ABNORMAL HIGH (ref 65–99)

## 2017-01-31 MED ORDER — GLIPIZIDE 5 MG PO TABS
5.0000 mg | ORAL_TABLET | Freq: Two times a day (BID) | ORAL | Status: DC
  Administered 2017-01-31 – 2017-02-05 (×9): 5 mg via ORAL
  Filled 2017-01-31 (×9): qty 1

## 2017-01-31 MED ORDER — INSULIN GLARGINE 100 UNIT/ML ~~LOC~~ SOLN
20.0000 [IU] | Freq: Every day | SUBCUTANEOUS | Status: DC
  Administered 2017-02-01 – 2017-02-03 (×3): 20 [IU] via SUBCUTANEOUS
  Filled 2017-01-31 (×3): qty 0.2

## 2017-01-31 MED ORDER — RIVAROXABAN 15 MG PO TABS
15.0000 mg | ORAL_TABLET | Freq: Two times a day (BID) | ORAL | Status: DC
  Administered 2017-01-31 – 2017-02-04 (×9): 15 mg via ORAL
  Filled 2017-01-31 (×8): qty 1

## 2017-01-31 NOTE — Plan of Care (Signed)
  Progressing RH BOWEL ELIMINATION RH STG MANAGE BOWEL W/MEDICATION W/ASSISTANCE Description STG Manage Bowel with Medication with Assistance. 01/31/2017 1821 - Progressing by Melina ModenaBurchett, Gem Conkle, RN RH BLADDER ELIMINATION RH STG MANAGE BLADDER WITH ASSISTANCE Description STG Manage Bladder With Assistance 01/31/2017 1821 - Progressing by Melina ModenaBurchett, Melenda Bielak, RN RH SKIN INTEGRITY RH STG SKIN FREE OF INFECTION/BREAKDOWN 01/31/2017 1821 - Progressing by Melina ModenaBurchett, Loann Chahal, RN RH STG MAINTAIN SKIN INTEGRITY WITH ASSISTANCE Description STG Maintain Skin Integrity With Assistance. 01/31/2017 1821 - Progressing by Melina ModenaBurchett, Larenzo Caples, RN RH SAFETY RH STG ADHERE TO SAFETY PRECAUTIONS W/ASSISTANCE/DEVICE Description STG Adhere to Safety Precautions With Assistance/Device. 01/31/2017 1821 - Progressing by Melina ModenaBurchett, Millenia Waldvogel, RN RH STG DECREASED RISK OF FALL WITH ASSISTANCE Description STG Decreased Risk of Fall With Assistance. 01/31/2017 1821 - Progressing by Melina ModenaBurchett, Calani Gick, RN RH PAIN MANAGEMENT RH STG PAIN MANAGED AT OR BELOW PT'S PAIN GOAL 01/31/2017 1821 - Progressing by Melina ModenaBurchett, Lindee Leason, RN RH KNOWLEDGE DEFICIT GENERAL RH STG INCREASE KNOWLEDGE OF SELF CARE AFTER HOSPITALIZATION 01/31/2017 1821 - Progressing by Melina ModenaBurchett, Guerline Happ, RN

## 2017-01-31 NOTE — Progress Notes (Signed)
Occupational Therapy Session Note  Patient Details  Name: Mark LoseVernell Chamorro Jr. MRN: [id number] Date of Birth: 05/28/1980  Today's Date: 01/31/2017 OT Individual Time: [id number] OT Individual Time Calculation (min): 57 min   Short Term Goals: Week 1:  OT Short Term Goal 1 (Week 1): Pt will be able to sit to E[facility] with [facility]. [facility] Short Term Goal 2 (Week 1): [facility] OT Short Term Goal 3 (Week 1): Pt will be able to lean on R hip to be able to cleanse self in lateral lean on toilet. OT Short Term Goal 4 (Week 1): Pt will be able to don a shirt with mod A.  Skilled Therapeutic Interventions/Progress Updates:    Tx focus on R UE AROM, splint mgt, and pt education.   Pt greeted supine in bed, still on bedrest but agreeable to bed exercises. Pt with multiple questions regarding ELOS, OT goals, and realistic expectations of his therapy progression. Pt receptive to education, encouragement, and emotional support. He continues to benefit from therapeutic listening/encouragement to enhance feelings of self efficacy. Removed Rt forearm splint to assess skin. Pt with no signs of redness or pressure areas. OT washed Rt hand and forearm while splint was removed for hygiene/skin integrity. Once splint was donned again (after thoroughly drying limb), educated pt in gentle AAROM exercises with pt actively assisting with Lt by applying pressure at ulnar aspect of forearm. Pt with limited shoulder ROM, reported pain above 130 degrees shoulder flexion, pain with 120 degrees shoulder abduction. Worked on flexing/extending fingers, pt exhibiting difficultly with full digit extension and required active assist for this (No pain). Completed overhead circumduction AAROM exercises with OT supporting elbow and forearm. Pt reported that he was unable to read his bible due to Rt hand limitations. Modified his bedside table to accommodate book being elevated on  chest. Adjusted HOB for increasing ease of reading while maintaining neutral cervical alignment. He reported this helped. Pt left with all needs within reach at time of departure.   Pt oriented x4 during tx.   Therapy Documentation Precautions:  Precautions Precautions: Cervical, Fall Required Braces or Orthoses: Sling, Cervical Brace, Other Brace/Splint Cervical Brace: At all times, Hard collar, Soft collar(soft in bed, hard OOB) Other Brace/Splint: RLE CAM boot Restrictions Weight Bearing Restrictions: Yes RUE Weight Bearing: Non weight bearing RLE Weight Bearing: Non weight bearing Other Position/Activity Restrictions: may weight bear through R elbow Vital Signs: Therapy Vitals Temp: 99.8 F (37.7 C) Temp Source: Oral Pulse Rate: 72 Resp: 19 BP: 102/80 Patient Position (if appropriate): Lying Oxygen Therapy SpO2: 100 % O2 Device: Not Delivered Pain: See above.   ADL: ADL ADL Comments: Refer to functional navigator    See Function Navigator for Current Functional Status.   Therapy/Group: Individual Therapy  Joseh Sjogren A Raffael Bugarin 01/31/2017, 3:41 PM

## 2017-01-31 NOTE — IPOC Note (Addendum)
Overall Plan of Care [facility](IPOC) Patient Details Name: Mark LoseVernell Brannock Jr. MRN: [id number] DOB: 06/20/1980  Admitting Diagnosis: <principal problem not specified> TBI with polytrauma  Hospital Problems: Active Problems:   TBI (traumatic brain injury) (HCC)     Functional Problem List: Nursing Pain, Skin Integrity, Safety  PT Balance, Safety, Endurance, Motor  OT Balance, Cognition, Endurance, Motor, Pain, Safety, Skin Integrity  SLP Cognition  TR         Basic ADL's: OT Bathing, Dressing, Toileting, Grooming     Advanced  ADL's: OT       Transfers: PT Bed Mobility, Bed to Chair, Car, State Street CorporationFurniture, Floor  OT Toilet     Locomotion: PT Ambulation, Psychologist, prison and probation servicesWheelchair Mobility, Stairs     Additional Impairments: OT None  SLP Swallowing, Social Cognition   Problem Solving, Memory  TR      Anticipated Outcomes Item Anticipated Outcome  Self Feeding Independent  Swallowing  Mod I   Basic self-care  min A with dressing and bathing  Toileting  min A   Bathroom Transfers min A to toilet or BSC  Bowel/Bladder  To maintain continence of bowel and bladder.  Transfers  Supervision  Locomotion  Supervision  Communication     Cognition  Mod I   Pain  <3.  Safety/Judgment  For patient to continue to cooperate with saftey plan and have no falls or injuries.   Therapy Plan: PT Intensity: Minimum of 1-2 x/day ,45 to 90 minutes PT Frequency: 5 out of 7 days PT Duration Estimated Length of Stay: 16-20 days OT Intensity: Minimum of 1-2 x/day, 45 to 90 minutes OT Frequency: 5 out of 7 days OT Duration/Estimated Length of Stay: 18-20 days SLP Intensity: Minumum of 1-2 x/day, 30 to 90 minutes SLP Frequency: 3 to 5 out of 7 days SLP Duration/Estimated Length of Stay: 18-20 days     Team Interventions: Nursing Interventions Patient/Family Education, Skin Care/Wound Management, Pain Management, Psychosocial Support  PT interventions Ambulation/gait training, Community reintegration,  DME/adaptive equipment instruction, Neuromuscular re-education, Psychosocial support, Stair training, Wheelchair propulsion/positioning, UE/LE Strength taining/ROM, UE/LE Coordination activities, Therapeutic Activities, Skin care/wound management, Pain management, Functional electrical stimulation, Discharge planning, Warden/rangerBalance/vestibular training, Cognitive remediation/compensation, Disease management/prevention, Functional mobility training, Patient/family education, Splinting/orthotics, Therapeutic Exercise, Visual/perceptual remediation/compensation  OT Interventions Balance/vestibular training, Discharge planning, DME/adaptive equipment instruction, Functional mobility training, Self Care/advanced ADL retraining, Therapeutic Activities, UE/LE Coordination activities, Pain management, Therapeutic Exercise, Patient/family education, Skin care/wound managment, Psychosocial support, UE/LE Strength taining/ROM, Splinting/orthotics  SLP Interventions Cognitive remediation/compensation, Environmental controls, Internal/external aids, Therapeutic Activities, Patient/family education, Functional tasks, Cueing hierarchy, Dysphagia/aspiration precaution training  TR Interventions    SW/CM Interventions Discharge Planning, Psychosocial Support, Patient/Family Education   Barriers to Discharge MD  Medical stability and Weight bearing restrictions  Nursing      PT Home environment access/layout, Weight bearing restrictions, Decreased caregiver support Mother may not be able to assist physically, however able to provide at least supervision level assist at discharge.   OT Weight bearing restrictions NWB on RUE/RLE  SLP      SW       Team Discharge Planning: Destination: PT-Home ,OT- Home , SLP-Home Projected Follow-up: PT-Outpatient PT(OPPT if mother can safely drive pt to/from), OT-  Home health OT, SLP-24 hour supervision/assistance(TBD) Projected Equipment Needs: PT-To be determined, OT- Tub/shower  bench, 3 in 1 bedside comode, SLP-None recommended by SLP Equipment Details: PT- , OT-  Patient/family involved in discharge planning: PT- Patient,  OT-Patient, SLP-Patient  MD ELOS: 18-20 days Medical Rehab  Prognosis:  Excellent Assessment: The patient has been admitted for CIR therapies with the diagnosis of TBI with polytrauma. The team will be addressing functional mobility, strength, stamina, balance, safety, adaptive techniques and equipment, self-care, bowel and bladder mgt, patient and caregiver education, neuromuscular reeducation, pain control, weightbearing precautions, orthotic use, wheelchair use, community reentry, cognition, communication. Goals have been set at min assist for basic self-care and ADLs, min assist to supervision for basic transfers and mobility primarily in a wheelchair level and modified independent for cognition.    Mark OysterZachary T. Swartz, MD, FAAPMR      See Team Conference Notes for weekly updates to the plan of care

## 2017-01-31 NOTE — Progress Notes (Addendum)
Physical Therapy Note  Patient Details  Name: Mark LoseVernell Ollis Jr. MRN: [id number] Date of Birth: 08/25/1980 Today's Date: 01/31/2017    Pt continues to be on bedrest today with plan to start anticoagulation therapy. Discussed with PA, and recommending hold for PT eval to assess mobility until pt has received 2 therapeutic doses - 24 hours. Pt being seen by OT and ST at bed level today. Will continue to defer PT eval until tomorrow in order to make full assessment and recommendations for plan of care and goals.     Addendum: Pt missed 60 min of skilled PT.  [name], Mark Mcguire  Tamra Koos B. Camyra Vaeth, PT, DPT  01/31/2017, 9:54 AM
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
## 2017-01-31 NOTE — Progress Notes (Signed)
Speech Language Pathology Daily Session Note  Patient Details  Name: Mark LoseVernell Mundis Jr. MRN: [id number] Date of Birth: 09/03/1980  Today's Date: 01/31/2017 SLP Individual Time: 0900-0930 SLP Individual Time Calculation (min): 30 min  Short Term Goals: Week 1: SLP Short Term Goal 1 (Week 1): Patient will consume current diet with minimal overt s/s of aspiration with Mod I for use of swallowing compensatory strategies.  SLP Short Term Goal 2 (Week 1): Patient will demonstrate functional problem solving for complex tasks with Mod I.  SLP Short Term Goal 3 (Week 1): Patient will recall new, daily information with use of external aids with supervision verbal cues.   Skilled Therapeutic Interventions: Pt seen this date for skilled SLP intervention target cognitive deficits. SLP facilitated session by providing supervision to Mod I verbal cues to recall precautions re: NWB status of RLE, no straws restriction, and need to call for assistance. Oriented x 4 independently. Completed recall tasks re: functional information re: care, daily events, therapies, and POC given supervision verbal cues. Completed basic functional problem-solving re: mental time calculations re: therapy schedule with Supervision verbal cues. Pt completed scheduling task with 100% accuracy given supervision verbal cues on 1 out of 6 trials; otherwise, pt completed with Mod I. At the end of the session, pt left upright in bed with call bell within reach. SLP to continue current plan of care.   Function:  Cognition Comprehension Comprehension assist level: Follows complex conversation/direction with extra time/assistive device  Expression   Expression assist level: Expresses complex ideas: With extra time/assistive device  Social Interaction Social Interaction assist level: Interacts appropriately with others with medication or extra time (anti-anxiety, antidepressant).  Problem Solving Problem solving assist level: Solves basic  90% of the time/requires cueing < 10% of the time  Memory Memory assist level: Recognizes or recalls 90% of the time/requires cueing < 10% of the time    Pain Pain Assessment Pain Assessment: No/denies pain  Therapy/Group: Individual Therapy  Claron Rosencrans A Karsten Vaughn 01/31/2017, 12:52 PM
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
## 2017-01-31 NOTE — Progress Notes (Signed)
Discussed results of duplex with Dr. Bevely Palmeritty. He cleared patient for "therapeutic anticoagulation."

## 2017-01-31 NOTE — Care Management (Signed)
[facility] Individual Statement of Services  Patient Name:  Mark LoseVernell Longhi Jr.  Date:  01/31/2017  Welcome to the [facility]patient [facility].  Our goal is to provide you with an individualized program based on your diagnosis and situation, designed to meet your specific needs.  With this comprehensive rehabilitation program, you will be expected to participate in at least 3 hours of rehabilitation therapies Monday-Friday, with modified therapy programming on the weekends.  Your rehabilitation program will include the following services:  Physical Therapy (PT), Occupational Therapy (OT), Speech Therapy (ST), 24 hour per day rehabilitation nursing, Therapeutic Recreaction (TR), Neuropsychology, Case Management (Social Worker), Rehabilitation Medicine, Nutrition Services and Pharmacy Services  Weekly team conferences will be held on Tuesdays to discuss your progress.  Your Social Worker will talk with you frequently to get your input and to update you on team discussions.  Team conferences with you and your family in attendance may also be held.  Expected length of stay: 18-21 days   Overall anticipated outcome: minimal assistance  Depending on your progress and recovery, your program may change. Your Social Worker will coordinate services and will keep you informed of any changes. Your Social Worker's name and contact numbers are listed  below.  The following services may also be recommended but are not provided by the Inpatient Rehabilitation Center:   Driving Evaluations  Home Health Rehabiltiation Services  Outpatient Rehabilitation Services  Vocational Rehabilitation   Arrangements will be made to provide these services after discharge if needed.  Arrangements include referral to agencies that provide these services.  Your insurance has been verified to be:  Medicaid application pending [facility]  Pertinent  information will be shared with your doctor and your insurance company.  Social Worker:  [place] CityLucy Jeronda Don, [place] [phone] or ([phone]) [phone]   Information discussed with and copy given to patient by: Amada JupiterHOYLE, [place] Year[place], 01/31/2017, 4:48 PM

## 2017-01-31 NOTE — Progress Notes (Signed)
Speech Language Pathology Daily Session Note  Patient Details  Name: Mark LoseVernell Duprey Jr. MRN: [id number] Date of Birth: 05/28/1980  Today's Date: 01/31/2017 SLP Individual Time: 1454-1535 SLP Individual Time Calculation (min): 41 min  Short Term Goals: Week 1: SLP Short Term Goal 1 (Week 1): Patient will consume current diet with minimal overt s/s of aspiration with Mod I for use of swallowing compensatory strategies.  SLP Short Term Goal 2 (Week 1): Patient will demonstrate functional problem solving for complex tasks with Mod I.  SLP Short Term Goal 3 (Week 1): Patient will recall new, daily information with use of external aids with supervision verbal cues.   Skilled Therapeutic Interventions:  Pt was seen for skilled ST targeting cognitive goals.  SLP facilitated the session with money and medication management tasks to address problem solving and recall of information.  Pt was able to count money and make change for 100% accuracy with mod I.  He was able to recall general functions of his medications but had difficulty matching functions to medication names.  Therapist provided pt with a written list of medications to address organization of pills into a pill box at next available appointment.  Pt was left in bed with bed alarm set and cal bell within reach.  Continue per current plan of care.    Function:  Eating Eating                 Cognition Comprehension Comprehension assist level: Follows complex conversation/direction with extra time/assistive device  Expression   Expression assist level: Expresses complex ideas: With extra time/assistive device  Social Interaction Social Interaction assist level: Interacts appropriately with others with medication or extra time (anti-anxiety, antidepressant).  Problem Solving Problem solving assist level: Solves basic 90% of the time/requires cueing < 10% of the time  Memory Memory assist level: Recognizes or recalls 90% of the  time/requires cueing < 10% of the time    Pain Pain Assessment Pain Assessment: No/denies pain  Therapy/Group: Individual Therapy  Donn Wilmot, Melanee SpryNicole L 01/31/2017, 4:49 PM

## 2017-02-01 ENCOUNTER — Inpatient Hospital Stay (HOSPITAL_COMMUNITY): Payer: Self-pay | Admitting: Occupational Therapy

## 2017-02-01 ENCOUNTER — Inpatient Hospital Stay (HOSPITAL_COMMUNITY): Payer: Self-pay | Admitting: Physical Therapy

## 2017-02-01 LAB — GLUCOSE, CAPILLARY
GLUCOSE-CAPILLARY: 159 mg/dL — AB (ref 65–99)
Glucose-Capillary: 115 mg/dL — ABNORMAL HIGH (ref 65–99)
Glucose-Capillary: 131 mg/dL — ABNORMAL HIGH (ref 65–99)
Glucose-Capillary: 147 mg/dL — ABNORMAL HIGH (ref 65–99)
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
## 2017-02-01 NOTE — Progress Notes (Signed)
Occupational Therapy Session Note  Patient Details  Name: Mark LoseVernell Mochizuki Jr. MRN: [id number] Date of Birth: 07/06/1980  Today's Date: 02/01/2017 OT Individual Time: [id number] and 1300-1359 OT Individual Time Calculation (min): 74 min and 59 min  Short Term Goals: Week 1:  OT Short Term Goal 1 (Week 1): Pt will be able to sit to EOB with min A [facility]. OT Short Term Goal 2 (Week 1): [facility]. OT Short Term Goal 3 (Week 1): Pt will be able to lean on R hip to be able to cleanse self in lateral lean on toilet. OT Short Term Goal 4 (Week 1): Pt will be able to don a shirt with mod A.  Skilled Therapeutic Interventions/Progress Updates:    Tx focus on ADL retraining, precaution adherence, balance, and functional transfers.   Checked with RN regarding pts bedrest orders. Called MD together. Dr. Cato Mcguire okay'd OOB therapies today. Pt seen for bathing. Once soft collar was removed and hard aspen collar donned, he transitioned to EOB with Min A and cues for NWB on Rt wrist. Pt maintaining balance with supervision while engaged in bathing tasks. Assist required for Lt/Rt arms. Removed his splint and assessed skin, with no visible signs of redness or pressure sores. Cleaned hand/forearm and thoroughly dried area before donning clean splint sleeve/splint and applying ACE wrap. He was able to wash Lt foot in figure 4 position. After hospital gown was donned, pt returned to supine and rolled to Rt side (weightbearing through elbow) in order for OT to complete perihygiene. RN in to change sacral bandage. Pt transitioned back to EOB, transferred to recliner with PF walker and 2 helpers for NWB on Rt. Pt was repositioned for comfort to alleviate pressure on buttocks. He was left with all needs within reach at session exit.   2nd Session 1:1 tx (59 min) Tx focus on functional transfers, precaution adherence, and Rt UE AROM.   Pt greeted supine in bed.  Reported he stayed in chair this AM for 30 minutes before his buttocks bothered him and then returned to bed. Found a blue cushion in his room that may improve OOB tolerance when sitting in recliner next week. Doffed soft c-collar/donned aspen collar in supine. After he transitioned to EOB, practiced stand pivot and squat pivot transfers to drop arm commode. Discussed adaptive method for pt to complete his own hygiene while laterally leaning to Rt. He verbalized strong desire to increase his independence with hygiene. "Not being able to do this stuff is a lot for me." Mod A for both transfer methods with cues for UE/LE placement and head hip/relationship during squat pivot.transfer. Adjusted toilet height to increase ease of power up when we used PF walker. Pt with improved eccentric control while lowering after practice and demonstration cuing. For remainder of tx, worked on Rt AAROM EOB with pt able to maintain balance with supervision without bilateral UE support. Pt still with limited shoulder ROM due to pain, and the arm feeling "heavy" to him. OT supported elbow and forearm during AAROM. Afterwards he transitioned to supine with Mod A. Donned soft collar. He was repositioned for comfort and left with all needs within reach and friend present.  Provided him small notepad/writing implement to write down feelings/thoughts. Pt expresses fear and uncertainty with his rehabilitation journey, also reports feeling sad that he is by himself for most of the day. He continues to benefit from interprofessional  psychosocial support.     Therapy Documentation Precautions:  Precautions Precautions: Cervical, Fall Required Braces or Orthoses: Sling, Cervical Brace, Other Brace/Splint Cervical Brace: At all times, Hard collar, Soft collar(soft in bed, hard OOB) Other Brace/Splint: RLE CAM boot Restrictions Weight Bearing Restrictions: Yes RUE Weight Bearing: Non weight bearing RLE Weight Bearing: Non weight  bearing Other Position/Activity Restrictions: may weight bear through R elbow Pain: No c/o pain during tx    ADL: ADL ADL Comments: Refer to functional navigator     See Function Navigator for Current Functional Status.   Therapy/Group: Individual Therapy  Mark Mcguire 02/01/2017, 12:27 PM
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
## 2017-02-01 NOTE — Plan of Care (Signed)
  Progressing RH BOWEL ELIMINATION RH STG MANAGE BOWEL WITH ASSISTANCE Description STG Manage Bowel with Assistance. 02/01/2017 1904 - Progressing by Melina ModenaBurchett, Jamayah Myszka, RN RH STG MANAGE BOWEL W/MEDICATION W/ASSISTANCE Description STG Manage Bowel with Medication with Assistance. 02/01/2017 1904 - Progressing by Melina ModenaBurchett, Elize Pinon, RN RH STG MANAGE BOWEL W/EQUIPMENT W/ASSISTANCE Description STG Manage Bowel With Equipment With Assistance 02/01/2017 1904 - Progressing by Melina ModenaBurchett, Kaydin Karbowski, RN RH BLADDER ELIMINATION RH STG MANAGE BLADDER WITH ASSISTANCE Description STG Manage Bladder With Assistance 02/01/2017 1904 - Progressing by Melina ModenaBurchett, Sahib Pella, RN RH SKIN INTEGRITY RH STG SKIN FREE OF INFECTION/BREAKDOWN 02/01/2017 1904 - Progressing by Melina ModenaBurchett, Taetum Flewellen, RN RH STG MAINTAIN SKIN INTEGRITY WITH ASSISTANCE Description STG Maintain Skin Integrity With Assistance. 02/01/2017 1904 - Progressing by Melina ModenaBurchett, Vadim Centola, RN RH SAFETY RH STG ADHERE TO SAFETY PRECAUTIONS W/ASSISTANCE/DEVICE Description STG Adhere to Safety Precautions With Assistance/Device. 02/01/2017 1904 - Progressing by Melina ModenaBurchett, Duward Allbritton, RN RH STG DECREASED RISK OF FALL WITH ASSISTANCE Description STG Decreased Risk of Fall With Assistance. 02/01/2017 1904 - Progressing by Melina ModenaBurchett, Lennon Boutwell, RN RH PAIN MANAGEMENT RH STG PAIN MANAGED AT OR BELOW PT'S PAIN GOAL 02/01/2017 1904 - Progressing by Melina ModenaBurchett, Monty Mccarrell, RN RH KNOWLEDGE DEFICIT GENERAL RH STG INCREASE KNOWLEDGE OF SELF CARE AFTER HOSPITALIZATION 02/01/2017 1904 - Progressing by Melina ModenaBurchett, Jhan Conery, RN

## 2017-02-02 ENCOUNTER — Inpatient Hospital Stay (HOSPITAL_COMMUNITY): Payer: No Typology Code available for payment source | Admitting: Physical Therapy

## 2017-02-02 LAB — GLUCOSE, CAPILLARY
GLUCOSE-CAPILLARY: 176 mg/dL — AB (ref 65–99)
GLUCOSE-CAPILLARY: 129 mg/dL — AB (ref 65–99)
GLUCOSE-CAPILLARY: 118 mg/dL — AB (ref 65–99)
Glucose-Capillary: 81 mg/dL (ref 65–99)
Glucose-Capillary: 118 mg/dL — ABNORMAL HIGH (ref 65–99)

## 2017-02-02 MED ORDER — ALTEPLASE 2 MG IJ SOLR
2.0000 mg | Freq: Once | INTRAMUSCULAR | Status: AC
  Administered 2017-02-02: 2 mg
  Filled 2017-02-02: qty 2
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
## 2017-02-02 NOTE — Progress Notes (Signed)
Physical Therapy Session Note  Patient Details  Name: Mark LoseVernell Song Jr. MRN: [id number] Date of Birth: 09/22/1980  Today's Date: 02/02/2017 PT Individual Time: 1000-1100 PT Individual Time Calculation (min): 60 min   Short Term Goals: Week 1:     Skilled Therapeutic Interventions/Progress Updates:  Pt was seen bedside in the am. Pt with multiple questions in regards to POC, prognosis and outcomes. Donned hard cervical collar total A. Pt transferred supine to edge of bed with min A and verbal cues. Pt transferred edge of bed to w/c with R platform walker and min to mod A with verbal cues. Pt transported to rehab gym. Treatment focused on sit to stand transfers with blocked practice, 3 sets x 5 reps each. Pt ambulated about 10 feet with R platform walker and min A with multiple verbal cues to maintain WB. Pt performed squat pivot transfers with min to mod A. Pt transferred edge of bed to supine with min A. Pt total A to doff hard cervical collar and replace with soft collar. Pt left sitting up in bed with call bell within reach and family at bedside.   Therapy Documentation Precautions:  Precautions Precautions: Cervical, Fall Required Braces or Orthoses: Sling, Cervical Brace, Other Brace/Splint Cervical Brace: At all times, Hard collar, Soft collar(Soft collar in bed, hard collar OOB) Other Brace/Splint: RLE CAM boot Restrictions Weight Bearing Restrictions: Yes RUE Weight Bearing: Non weight bearing RLE Weight Bearing: Non weight bearing Other Position/Activity Restrictions: may weight bear through R elbow General:   Pain: Pain Assessment Pain Assessment: No/denies pain Pain Score: 0-No pain   See Function Navigator for Current Functional Status.   Therapy/Group: Individual Therapy  Mark Mcguire, Mark Mcguire 02/02/2017, 12:12 PM

## 2017-02-02 NOTE — Plan of Care (Signed)
  Progressing RH BOWEL ELIMINATION RH STG MANAGE BOWEL WITH ASSISTANCE Description STG Manage Bowel with Assistance. 02/02/2017 1006 - Progressing by Melina ModenaBurchett, Camden Mazzaferro, RN RH STG MANAGE BOWEL W/MEDICATION W/ASSISTANCE Description STG Manage Bowel with Medication with Assistance. 02/02/2017 1006 - Progressing by Melina ModenaBurchett, Famous Eisenhardt, RN RH STG MANAGE BOWEL W/EQUIPMENT W/ASSISTANCE Description STG Manage Bowel With Equipment With Assistance 02/02/2017 1006 - Progressing by Melina ModenaBurchett, Yosmar Ryker, RN RH BLADDER ELIMINATION RH STG MANAGE BLADDER WITH ASSISTANCE Description STG Manage Bladder With Assistance 02/02/2017 1006 - Progressing by Melina ModenaBurchett, Arrianna Catala, RN RH SKIN INTEGRITY RH STG SKIN FREE OF INFECTION/BREAKDOWN 02/02/2017 1006 - Progressing by Melina ModenaBurchett, Shakedra Beam, RN RH STG MAINTAIN SKIN INTEGRITY WITH ASSISTANCE Description STG Maintain Skin Integrity With Assistance. 02/02/2017 1006 - Progressing by Melina ModenaBurchett, Mathilda Maguire, RN RH SAFETY RH STG ADHERE TO SAFETY PRECAUTIONS W/ASSISTANCE/DEVICE Description STG Adhere to Safety Precautions With Assistance/Device. 02/02/2017 1006 - Progressing by Melina ModenaBurchett, Kaiyan Luczak, RN RH STG DECREASED RISK OF FALL WITH ASSISTANCE Description STG Decreased Risk of Fall With Assistance. 02/02/2017 1006 - Progressing by Melina ModenaBurchett, Primrose Oler, RN RH PAIN MANAGEMENT RH STG PAIN MANAGED AT OR BELOW PT'S PAIN GOAL 02/02/2017 1006 - Progressing by Melina ModenaBurchett, Lakshya Mcgillicuddy, RN RH KNOWLEDGE DEFICIT GENERAL RH STG INCREASE KNOWLEDGE OF SELF CARE AFTER HOSPITALIZATION 02/02/2017 1006 - Progressing by Melina ModenaBurchett, Abby Tucholski, RN

## 2017-02-03 ENCOUNTER — Inpatient Hospital Stay (HOSPITAL_COMMUNITY): Payer: No Typology Code available for payment source | Admitting: Occupational Therapy

## 2017-02-03 ENCOUNTER — Inpatient Hospital Stay (HOSPITAL_COMMUNITY): Payer: Self-pay

## 2017-02-03 ENCOUNTER — Encounter (HOSPITAL_COMMUNITY): Payer: Self-pay

## 2017-02-03 LAB — GLUCOSE, CAPILLARY
GLUCOSE-CAPILLARY: 91 mg/dL (ref 65–99)
Glucose-Capillary: 153 mg/dL — ABNORMAL HIGH (ref 65–99)
Glucose-Capillary: 136 mg/dL — ABNORMAL HIGH (ref 65–99)

## 2017-02-03 MED ORDER — INSULIN GLARGINE 100 UNIT/ML ~~LOC~~ SOLN
10.0000 [IU] | Freq: Every day | SUBCUTANEOUS | Status: DC
  Administered 2017-02-04 – 2017-02-05 (×2): 10 [IU] via SUBCUTANEOUS
  Filled 2017-02-03 (×2): qty 0.1

## 2017-02-03 NOTE — Progress Notes (Signed)
Occupational Therapy Session Note  Patient Details  Name: Mark Mcguire. MRN: [id number] Date of Birth: 04/08/1980  Today's Date: 02/03/2017 OT Individual Time: 0705-0800 and 1300-1410 OT Individual Time Calculation (min): 55 min and 70 min    Short Term Goals: Week 1:  OT Short Term Goal 1 (Week 1): Pt will be able to sit to EOB with min [facility]. [facility] Short Term Goal 2 (Week 1): [facility]. OT Short Term Goal 3 (Week 1): Pt will be able to lean on R hip to be able to cleanse self in lateral lean on toilet. OT Short Term Goal 4 (Week 1): Pt will be able to don a shirt with mod A.  Skilled Therapeutic Interventions/Progress Updates:    Session 1: Upon entering the room, pt supine in bed awaiting therapist arrival. Pt very anxious about length of stay and having various questions regarding POC and recovery. OT answered questions until pt satisfied and no further answers needed. Pt performed supine >sit with min A and min verbal cues to remain NWB on R wrist. Pt standing from bed with mod lifting assistance and transferring to wheelchair with platform RW. Once seated in wheelchair, pt required set up A to open containers and min verbal cues with full supervision for safe swallowing strategies. Pt remained seated in wheelchair with call bell and all needed items within reach upon exiting the room.    Session 2: Upon entering the room, pt supine in bed with no c/o pain but reports feeling very fatigued. OT removing splint for skin check and hygiene as foul odor coming from splint. OT educating pt on splint care at home for how to wash properly. OT also placed velcro strips on splint vs. Ace wrap. RN arriving and removing stitches and assisting with hygiene and bandaging. New sleeve donned and cleaned splint placed. OT also checking R LE under CAM boot for any skin breakdown. None noted to be found. Pt washing face and UB with set up A and  donning clean gown. Plans made for next session for full bathing and dressing tomorrow. OT positioned pt in side lying on R side with call bell and all needed items within reach. Bed alarm activated.   Therapy Documentation Precautions:  Precautions Precautions: Cervical, Fall Required Braces or Orthoses: Sling, Cervical Brace, Other Brace/Splint Cervical Brace: At all times, Hard collar, Soft collar(Soft collar in bed, hard collar OOB) Other Brace/Splint: RLE CAM boot Restrictions Weight Bearing Restrictions: Yes RUE Weight Bearing: Non weight bearing RLE Weight Bearing: Non weight bearing Other Position/Activity Restrictions: may weight bear through R elbow General:   Vital Signs: Therapy Vitals Pulse Rate: 76 BP: 126/74 Pain: Pain Assessment Pain Score: 3  ADL: ADL ADL Comments: Refer to functional navigator Vision   Perception    Praxis   Exercises:   Other Treatments:    See Function Navigator for Current Functional Status.   Therapy/Group: Individual Therapy  Alen BleacherBradsher, Vennie Waymire P 02/03/2017, 12:22 PM

## 2017-02-03 NOTE — Progress Notes (Signed)
Speech Language Pathology Daily Make-up Session Note  Patient Details  Name: Mark LoseVernell Boedecker Jr. MRN: [id number] Date of Birth: 12/27/1980  Today's Date: 02/03/2017 SLP Individual Time: 1000-1030 SLP Individual Time Calculation (min): 30 min  Short Term Goals: Week 1: SLP Short Term Goal 1 (Week 1): Patient will consume current diet with minimal overt s/s of aspiration with Mod I for use of swallowing compensatory strategies.  SLP Short Term Goal 2 (Week 1): Patient will demonstrate functional problem solving for complex tasks with Mod I.  SLP Short Term Goal 3 (Week 1): Patient will recall new, daily information with use of external aids with supervision verbal cues.   Skilled Therapeutic Interventions: Skilled treatment session focused on cognitive goals. Patient was Mod I for recall and problem solving during a mildly complex, novel card task. Patient reported he felt he could recall procedures to task tomorrow without use of an external aid, will assess recall of information at next session.  Patient left upright in bed with all needs within reach. Continue with current plan of care.      Function:   Cognition Comprehension Comprehension assist level: Follows basic conversation/direction with no assist  Expression   Expression assist level: Expresses complex 90% of the time/cues < 10% of the time  Social Interaction Social Interaction assist level: Interacts appropriately 90% of the time - Needs monitoring or encouragement for participation or interaction.  Problem Solving Problem solving assist level: Solves complex 90% of the time/cues < 10% of the time  Memory Memory assist level: More than reasonable amount of time    Pain Pain Assessment Pain Assessment: No/denies pain  Therapy/Group: Individual Therapy  Nayda Riesen 02/03/2017, 2:44 PM

## 2017-02-03 NOTE — Progress Notes (Signed)
Physical Therapy Session Note  Patient Details  Name: Mark Mcguire. MRN: [id number] Date of Birth: 10/31/1980  Today's Date: 02/03/2017 PT Individual Time: [id number] PT Individual Time Calculation (min): 60 min   Short Term Goals: Week 1:  PT Short Term Goal 1 (Week 1): Pt will transfer bed<>chair w/ Min assist PT Short Term Goal 2 (Week 1): Pt will ambulate w/ LRAD 25' w/ Min assist PT Short Term Goal 3 (Week 1): Pt will adhere to WB precautions w/ minimal verbal cues PT Short Term Goal 4 (Week 1): Pt will tolerate 30 minutes of OOB activity w/o increase in fatigue PT Short Term Goal 5 (Week 1): Pt will self-propel w/c 3750' w/ Min A   Skilled Therapeutic Interventions/Progress Updates:    Session focused on addressing functional mobility while maintaining NWB status (requires total assist during transfers and standing), overall functional endurance, LE strengthening of RLE to aid with ability to hold RLE off ground for maintaining of NWB status, and education about progress, restrictions, and d/c planning. Pt requires overall mod assist for sit <> stands while actually maintaining NWB status - utilized a second person during training of blocked practice sit -> partial stand and then full sit <> stand with PFRW x 10 reps each with emphasis on technique to hold up RLE. Cues for hand placement, technique and adherence to precautions. Mod assist for scoot/squat pivot transfer, again requiring total assist for NWB of RLE. Instructed in seated hip flexion, LAQ with 5 second hold, and isometric hip adduction squeezes x 10 sec hold all x 10 reps each. Pt demonstrates good insight and awareness into effects of injuries in regards to work and home management though impaired memory in regards to information reviewed previously regarding length of time WB status to remain enforced. Pt declines staying up OOB due to fatigue and educated on importance of building OOB tolerance on overall endurance level. All  needs in reach and RLE/RUE elevated and positioned for neutral alignment.   Therapy Documentation Precautions:  Precautions Precautions: Cervical, Fall Required Braces or Orthoses: Sling, Cervical Brace, Other Brace/Splint Cervical Brace: At all times, Hard collar, Soft collar(Soft collar in bed, hard collar OOB) Other Brace/Splint: RLE CAM boot Restrictions Weight Bearing Restrictions: Yes RUE Weight Bearing: Non weight bearing RLE Weight Bearing: Non weight bearing Other Position/Activity Restrictions: may weight bear through R elbow Pain: Reports premedicated for pain in RUE.     See Function Navigator for Current Functional Status.   Therapy/Group: Individual Therapy  [name], [place] Piontek Darrol PokeBrescia  Elmon Shader B. Natallie Ravenscroft, PT, DPT  02/03/2017, 12:06 PM

## 2017-02-03 NOTE — Progress Notes (Signed)
[facility] PHYSICAL MEDICINE & REHABILITATION     PROGRESS NOTE    Subjective/Complaints: Up at chair. SLP in room. Pain controlled although he doesn't like RUE splint  ROS: pt denies nausea, vomiting, diarrhea, cough, shortness of breath or chest pain    Objective: Vital Signs: Blood pressure 126/74, pulse 76, temperature 99.6 F (37.6 C), temperature source Oral, resp. rate 18, SpO2 100 %. No results found. No results for input(s): WBC, HGB, HCT, PLT in the last 72 hours. No results for input(s): NA, K, CL, GLUCOSE, BUN, CREATININE, CALCIUM in the last 72 hours.  Invalid input(s): CO CBG (last 3)  Recent Labs    02/02/17 1843 02/02/17 2128 02/03/17 0654  GLUCAP 118* 118* 153*    Wt Readings from Last 3 Encounters:  01/24/17 95.4 kg (210 lb 5.1 oz)  11/29/15 99.8 kg (220 lb)    Physical Exam:  Constitutional: He is oriented to person, place, and time. He appears well-developed and well-nourished. No distress.  HENT:  Head: Normocephalic and atraumatic.  Mouth/Throat: oral mucosa moist   scar mid forehead  Eyes: Conjunctivae are normal. Pupils are equal, round, and reactive to light.  Gaze sl dsyconjug Neck:  Soft collar in place.   Cardiovascular: RRR without murmur. No JVD   Respiratory: CTA Bilaterally without wheezes or rales. Normal effort  GI: Soft. Bowel sounds are normal. He exhibits no distension. There is no tenderness.  Musculoskeletal: He exhibits trace edema  .  Right forearm with sutures noted at proximal and distal aspect of dressing. Dry/intact RLE with compressive dressing and CAM boot in place     Neurological: He is alert and oriented to person, place, and time.  Mild dysarthria but completely intelligible  Oriented to place, reason he's here. Reasonable awareness Motor: RUE: limited by sling, hand grip 3+/5 LUE: 5/5 proximal to distal RLE: HF 2+/5, distally in boot, able to wiggle toes--remains stable, limited by pain LLE: 3+/5 proximal to  distal Sensation intact to light touch in all 4's Skin: as above.     Psychiatric: pleasant and cooperative.  Sometimes anxious     Assessment/Plan: 1. 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 Physiatrist is providing close team supervision and 24 hour management of active medical problems listed below. Physiatrist and rehab team continue to assess barriers to discharge/monitor patient progress toward functional and medical goals.  Function:  Bathing Bathing position   Position: Other (comment)(UB EOB, LB bedlevel)  Bathing parts Body parts bathed by patient: Chest, Abdomen, Front perineal area, Left upper leg, Left lower leg, Right upper leg Body parts bathed by helper: Right arm, Left arm, Buttocks, Back  Bathing assist        Upper Body Dressing/Undressing Upper body dressing   What is the patient wearing?: Hospital gown                Upper body assist Assist Level: Touching or steadying assistance(Pt > 75%)      Lower Body Dressing/Undressing Lower body dressing   What is the patient wearing?: Non-skid slipper socks         Non-skid slipper socks- Performed by patient: Don/doff left sock                    Lower body assist        PrintmakerToileting Toileting          Toileting  assist     Transfers Chair/bed transfer Chair/bed transfer activity did not occur: N/A Chair/bed transfer method: Stand pivot Chair/bed transfer assist level: Moderate assist (Pt 50 - 74%/lift or lower) Chair/bed transfer assistive device: Armrests, Patent attorneyWalker     Locomotion Ambulation Ambulation activity did not occur: N/A   Max distance: 10 Assist level: Touching or steadying assistance (Pt > 75%)   Wheelchair Wheelchair activity did not occur: N/A Type: Manual Max wheelchair distance: 3950' Assist Level: Maximal assistance (Pt 25 - 49%)   Cognition Comprehension Comprehension assist level: Follows basic conversation/direction with no assist  Expression Expression assist level: Expresses complex 90% of the time/cues < 10% of the time  Social Interaction Social Interaction assist level: Interacts appropriately 90% of the time - Needs monitoring or encouragement for participation or interaction.  Problem Solving Problem solving assist level: Solves basic problems with no assist  Memory Memory assist level: More than reasonable amount of time   Medical Problem List and Plan: 1.  Cognitive deficits with delayed processing/sequencing, weight bearing restrictions on right as well as left sided weakness secondary to TBI/polytrauma.    -Continue PT OT and speech therapies.     -ongoing memory deficits 2.  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     -continue therapeutic dose lovenox    -up as tolerated 3. Pain Management: Hydrocodone prn  4. Mood: LCSW to follow for evaluation and support.  5. Neuropsych: This patient is not capable of making decisions on his  own behalf. 6. Skin/Wound Care: routine pressure relief measures 7. Fluids/Electrolytes/Nutrition: Monitor I/O.  I personally reviewed the patient's labs today.  All normal.  8. HTN: Continue metoprolol, lisinopril and HCTZ.  Continue to monitor bid and titrate medications as indicated. 9. ABLA: hgb 9.7 today 10. T2DM: Hgb A1c- 8.5.   -sugars with improvement    -continue Glucotrol 5 mg twice daily.  Decrease Lantus to 10 units daily    -Now on cm medium diet 11.Urinary retention: continue urecholine and Flomax for now.   Pt seems to be voiding.  12. Right open segmental tibial fracture s/p ORIF: NWB RLE with CAM boot on at all times---follow up X rays stable/improving.  13. Right distal radius fracture s/p ORIF: Ok to weight bear on right elbow-- follow up X rays stable/improving. Follow up with Dr. Kerry FortHaddick in 4 weeks.  14. Multifocal non-displaced C2 fracture: Collar on at all  times--soft collar when in bed and hard collar before getting out of bed.      LOS (Days) 5 A FACE TO FACE EVALUATION WAS PERFORMED  Ranelle OysterSWARTZ,ZACHARY T, MD 02/03/2017 8:42 AM

## 2017-02-03 NOTE — Progress Notes (Signed)
Speech Language Pathology Daily Session Note  Patient Details  Name: Mark LoseVernell Wynder Jr. MRN: [id number] Date of Birth: 04/02/1980  Today's Date: 02/03/2017 SLP Individual Time: 0800-0900 SLP Individual Time Calculation (min): 60 min  Short Term Goals: Week 1: SLP Short Term Goal 1 (Week 1): Patient will consume current diet with minimal overt s/s of aspiration with Mod I for use of swallowing compensatory strategies.  SLP Short Term Goal 2 (Week 1): Patient will demonstrate functional problem solving for complex tasks with Mod I.  SLP Short Term Goal 3 (Week 1): Patient will recall new, daily information with use of external aids with supervision verbal cues.   Skilled Therapeutic Interventions: Skilled ST services focused on cognitive skills. SLP facilitated medication management utilizing visual aid medication list and pill QID box organizer. Pt demonstrated ability to fill pill box utilizing medication list at Mod I level and monitor/correct errors at Mod I level. Pt demonstrated ability to recall personal medication information and 3 step directions given time delay of 50 minutes with supervision cues, however given long term delay such as a weekend, pt was unable to recall how long Dr. Riley KillSwartz reccommended to leave arm brace on.  SLP educated pt in written strategies to aid in delayed recall of complex and novel information, pt stated understanding. Pt requested to get in bed, pt demonstrated recall of safety precautions during transfer at Mod I level, however functionally during transfer demonstrated slight impulsive behaviors and reduction in safety awareness. Pt was left in bed with call bell within reach. Recommend to continue skilled St services.     Function:  Eating Eating   Modified Consistency Diet: No Eating Assist Level: Set up assist for   Eating Set Up Assist For: Opening containers;Cutting food       Cognition Comprehension Comprehension assist level: Follows basic  conversation/direction with no assist  Expression   Expression assist level: Expresses complex 90% of the time/cues < 10% of the time  Social Interaction Social Interaction assist level: Interacts appropriately 90% of the time - Needs monitoring or encouragement for participation or interaction.  Problem Solving Problem solving assist level: Solves complex 90% of the time/cues < 10% of the time  Memory Memory assist level: More than reasonable amount of time    Pain Pain Assessment Pain Assessment: No/denies pain  Therapy/Group: Individual Therapy  Mark Mcguire  [facility]CR[facility] 02/03/2017, 2:01 PM

## 2017-02-04 ENCOUNTER — Inpatient Hospital Stay (HOSPITAL_COMMUNITY): Payer: Self-pay | Admitting: Physical Therapy

## 2017-02-04 ENCOUNTER — Inpatient Hospital Stay (HOSPITAL_COMMUNITY): Payer: No Typology Code available for payment source | Admitting: Occupational Therapy

## 2017-02-04 ENCOUNTER — Encounter (HOSPITAL_COMMUNITY): Payer: Self-pay | Admitting: Psychology

## 2017-02-04 ENCOUNTER — Inpatient Hospital Stay (HOSPITAL_COMMUNITY): Payer: No Typology Code available for payment source | Admitting: Speech Pathology

## 2017-02-04 DIAGNOSIS — T148XXA Other injury of unspecified body region, initial encounter: Secondary | ICD-10-CM

## 2017-02-04 LAB — GLUCOSE, CAPILLARY
GLUCOSE-CAPILLARY: 83 mg/dL (ref 65–99)
GLUCOSE-CAPILLARY: 111 mg/dL — AB (ref 65–99)
GLUCOSE-CAPILLARY: 138 mg/dL — AB (ref 65–99)
Glucose-Capillary: 154 mg/dL — ABNORMAL HIGH (ref 65–99)

## 2017-02-04 MED ORDER — SENNOSIDES-DOCUSATE SODIUM 8.6-50 MG PO TABS
2.0000 | ORAL_TABLET | Freq: Every day | ORAL | Status: DC
  Administered 2017-02-04 – 2017-02-17 (×14): 2 via ORAL
  Filled 2017-02-04 (×15): qty 2

## 2017-02-04 MED ORDER — RIVAROXABAN 20 MG PO TABS: 20.0000 mg | ORAL_TABLET | Freq: Every day | ORAL | Status: DC

## 2017-02-04 MED ORDER — RIVAROXABAN 15 MG PO TABS
15.0000 mg | ORAL_TABLET | Freq: Two times a day (BID) | ORAL | Status: DC
  Administered 2017-02-04 – 2017-02-18 (×28): 15 mg via ORAL
  Filled 2017-02-04 (×28): qty 1

## 2017-02-04 NOTE — Progress Notes (Signed)
Occupational Therapy Session Note  Patient Details  Name: Mark Platas Jr. MRN: [id number] Date of Birth: 06/14/1980  Today's Date: [name]12/06/2016 OT Individual Time: 9629-52840705-0830 OT Individual Time Calculation (min): 85 min    Short Term Goals: Week 1:  OT Short Term Goal 1 (Week 1): Pt will be able to sit to EOB with min [facility]. OT Short Term Goal 2 (Week 1): [facility]. OT Short Term Goal 3 (Week 1): Pt will be able to lean on R hip to be able to cleanse self in lateral lean on toilet. OT Short Term Goal 4 (Week 1): Pt will be able to don a shirt with mod A.  Skilled Therapeutic Interventions/Progress Updates:    Upon entering the room, pt supine in bed with no c/o pain this session. Pt agreeable to bathing and dressing. Pt initiating washing LB from bed level in order to increase independence with task. Pt donning pull over pants with assistance to thread and pull over B hips. Pt rolling L <> R with min A. Hard collar donned in supine. Supine >sit with min A for trunk to EOB. Pt standing with use of platform RW and mod A for stand pivot transfer into wheelchair. Pt seated in chair at sink for grooming tasks with set up A. UB self care performed with min A this session. Skin checked under R UE splint and appeared intact. Pt remained seated in wheelchair and finished breakfast with supervision for safe swallowing techniques. Call bell and all needed items within reach upon exiting the room.   Therapy Documentation Precautions:  Precautions Precautions: Cervical, Fall Required Braces or Orthoses: Sling, Cervical Brace, Other Brace/Splint Cervical Brace: At all times, Hard collar, Soft collar(Soft collar in bed, hard collar OOB) Other Brace/Splint: RLE CAM boot Restrictions Weight Bearing Restrictions: Yes RUE Weight Bearing: Non weight bearing RLE Weight Bearing: Non weight bearing Other Position/Activity Restrictions: may weight  bear through R elbow General:   Vital Signs:   Pain:   ADL: ADL ADL Comments: Refer to functional navigator Vision   Perception    Praxis   Exercises:   Other Treatments:    See Function Navigator for Current Functional Status.   Therapy/Group: Individual Therapy  Alen BleacherBradsher, [name] 02/04/2017, 12:02 PM

## 2017-02-04 NOTE — Progress Notes (Signed)
Speech Language Pathology Daily Session Note  Patient Details  Name: Mark LoseVernell Germany Jr. MRN: [id number] Date of Birth: 12/10/1980  Today's Date: 02/04/2017 SLP Individual Time: [id number] SLP Individual Time Calculation (min): 55 min  Short Term Goals: Week 1: SLP Short Term Goal 1 (Week 1): Patient will consume current diet with minimal overt s/s of aspiration with Mod I for use of swallowing compensatory strategies.  SLP Short Term Goal 2 (Week 1): Patient will demonstrate functional problem solving for complex tasks with Mod I.  SLP Short Term Goal 3 (Week 1): Patient will recall new, daily information with use of external aids with supervision verbal cues.   Skilled Therapeutic Interventions: Skilled treatment session focused on cognitive and dysphagia goals. Patient independently recalled events/proecdures from a previously learned task and was Mod I for problem solving throughout task. Patient consumed thin liquids via cup with delayed and subtle throat clearing, however, patient reports he feels his throat clearing is more from habit rather than indications of decreased airway protection. Recommend patient continue current diet with set-up assist. Patient left upright in bed with all needs within reach. Continue with current plan of care.      Function:  Eating Eating   Modified Consistency Diet: No Eating Assist Level: Set up assist for   Eating Set Up Assist For: Opening containers;Cutting food       Cognition Comprehension Comprehension assist level: Understands complex 90% of the time/cues 10% of the time  Expression   Expression assist level: Expresses complex ideas: With extra time/assistive device  Social Interaction Social Interaction assist level: Interacts appropriately 90% of the time - Needs monitoring or encouragement for participation or interaction.  Problem Solving Problem solving assist level: Solves basic 90% of the time/requires cueing < 10% of the time   Memory Memory assist level: Recognizes or recalls 90% of the time/requires cueing < 10% of the time    Pain No/Denies Pain   Therapy/Group: Individual Therapy  Lannah Koike 02/04/2017, 3:46 PM
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
## 2017-02-04 NOTE — Progress Notes (Signed)
Physical Therapy Session Note  Patient Details  Name: Mark LoseVernell Wildermuth Jr. MRN: [id number] Date of Birth: 03/13/1980  Today's Date: 02/04/2017 PT Individual Time: 1191-47820845-0930 PT Individual Time Calculation (min): 45 min   Short Term Goals: Week 1:  PT Short Term Goal 1 (Week 1): Pt will transfer bed<>chair w/ Min assist PT Short Term Goal 2 (Week 1): Pt will ambulate w/ LRAD 25' w/ Min assist PT Short Term Goal 3 (Week 1): Pt will adhere to WB precautions w/ minimal verbal cues PT Short Term Goal 4 (Week 1): Pt will tolerate 30 minutes of OOB activity w/o increase in fatigue PT Short Term Goal 5 (Week 1): Pt will self-propel w/c 6650' w/ Min A   Skilled Therapeutic Interventions/Progress Updates:    Pt seated in w/c in room upon therapist arrival. Pt reports no pain this AM, agreeable to participate in therapy session. Sit to stand x 5 to PFRW with Min Assist, focus on maintaining WBing precautions during transfer. Ambulation 2 x 10 ft with PFRW and Min Assist for balance, w/c follow due to pt's decreased endurance and tolerance for upright mobility. Pt demonstrates good adherence to WBing precautions with min cues needed to maintain precautions. Supine R LE AROM therex x 10-15 reps to fatigue for strengthening and ROM. Sit to supine with Min Assist for R LE management, supine to sit Mod Assist for trunk from flat bed. Stand pivot transfer w/c to/from mat with PFRW and Min Assist for balance, v/c to [facility]. Pt left seated in w/c in room with needs in reach at end of therapy session.  Therapy Documentation Precautions:  Precautions Precautions: Cervical, Fall Required Braces or Orthoses: Sling, Cervical Brace, Other Brace/Splint Cervical Brace: At all times, Hard collar, Soft collar(Soft collar in bed, hard collar OOB) Other Brace/Splint: RLE CAM boot Restrictions Weight Bearing Restrictions: Yes RUE Weight Bearing: Non weight bearing RLE Weight Bearing: Non weight  bearing Other Position/Activity Restrictions: may weight bear through R elbow  See Function Navigator for Current Functional Status.   Therapy/Group: Individual Therapy  Peter Congoaylor  Blannie Shedlock 02/04/2017, 12:31 PM

## 2017-02-05 ENCOUNTER — Inpatient Hospital Stay (HOSPITAL_COMMUNITY): Payer: Self-pay | Admitting: *Deleted

## 2017-02-05 ENCOUNTER — Inpatient Hospital Stay (HOSPITAL_COMMUNITY): Payer: No Typology Code available for payment source | Admitting: Occupational Therapy

## 2017-02-05 ENCOUNTER — Inpatient Hospital Stay (HOSPITAL_COMMUNITY): Payer: Self-pay

## 2017-02-05 ENCOUNTER — Inpatient Hospital Stay (HOSPITAL_COMMUNITY): Payer: Self-pay | Admitting: Speech Pathology

## 2017-02-05 LAB — CBC
HEMATOCRIT: 33.4 % — AB (ref 39.0–52.0)
Hemoglobin: 10.6 g/dL — ABNORMAL LOW (ref 13.0–17.0)
MCH: 28.7 pg (ref 26.0–34.0)
MCHC: 31.7 g/dL (ref 30.0–36.0)
MCV: 90.5 fL (ref 78.0–100.0)
PLATELETS: 270 10*3/uL (ref 150–400)
RBC: 3.69 MIL/uL — ABNORMAL LOW (ref 4.22–5.81)
RDW: 14.8 % (ref 11.5–15.5)
WBC: 8 10*3/uL (ref 4.0–10.5)

## 2017-02-05 LAB — BASIC METABOLIC PANEL
Anion gap: 10 (ref 5–15)
BUN: 9 mg/dL (ref 6–20)
CO2: 25 mmol/L (ref 22–32)
CREATININE: 0.55 mg/dL — AB (ref 0.61–1.24)
Calcium: 9.4 mg/dL (ref 8.9–10.3)
Chloride: 101 mmol/L (ref 101–111)
GFR calc Af Amer: >60 mL/min (ref >60)
GLUCOSE: 136 mg/dL — AB (ref 65–99)
POTASSIUM: 3.6 mmol/L (ref 3.5–5.1)
SODIUM: 136 mmol/L (ref 135–145)

## 2017-02-05 LAB — GLUCOSE, CAPILLARY
GLUCOSE-CAPILLARY: 100 mg/dL — AB (ref 65–99)
GLUCOSE-CAPILLARY: 97 mg/dL (ref 65–99)
Glucose-Capillary: 146 mg/dL — ABNORMAL HIGH (ref 65–99)

## 2017-02-05 MED ORDER — GLIPIZIDE 5 MG PO TABS
10.0000 mg | ORAL_TABLET | Freq: Two times a day (BID) | ORAL | Status: DC
  Administered 2017-02-05 – 2017-02-11 (×13): 10 mg via ORAL
  Filled 2017-02-05 (×13): qty 2

## 2017-02-05 MED ORDER — METOPROLOL TARTRATE 50 MG PO TABS
100.0000 mg | ORAL_TABLET | Freq: Two times a day (BID) | ORAL | Status: DC
  Administered 2017-02-05 – 2017-02-18 (×26): 100 mg via ORAL
  Filled 2017-02-05 (×26): qty 2

## 2017-02-05 NOTE — Consult Note (Addendum)
WOC Nurse wound follow up Wound type:pressure injury sacrum stage II Measurement:3.5cm x 1.5cm x 0.1cm Wound bed: pale pink with some white slough around edges. Drainage (amount, consistency, odor) scant, no odor noted. Periwound:intact Dressing procedure/placement/frequency:Continue current wound care with Santyl. Wound bed should be ready for NS wet to dry by next week as it is cleaning up nicely. We will follow weekly.  Please re-consult if we need to assist further in between our weekly visits.   Barnett HatterMelinda Susannah Carbin, RN-C, WTA-C Wound Treatment Associate

## 2017-02-05 NOTE — Progress Notes (Signed)
Speech Language Pathology Daily Session Note  Patient Details  Name: Mark LoseVernell Gildner Jr. MRN: [id number] Date of Birth: 08/20/1980  Today's Date: 02/05/2017 SLP Individual Time: [id number] SLP Individual Time Calculation (min): 55 min  Short Term Goals: Week 1: SLP Short Term Goal 1 (Week 1): Patient will consume current diet with minimal overt s/s of aspiration with Mod I for use of swallowing compensatory strategies.  SLP Short Term Goal 2 (Week 1): Patient will demonstrate functional problem solving for complex tasks with Mod I.  SLP Short Term Goal 3 (Week 1): Patient will recall new, daily information with use of external aids with supervision verbal cues.   Skilled Therapeutic Interventions:  Mark Mcguire was seen for skilled ST targeting cognitive goals.  SLP facilitated the session with deductive reasoning puzzles to address problem solving goals.  Mark Mcguire initially needed supervision cues for organization but as task progressed therapist was able to fade cues to mod I for 100% accuracy.  Mark Mcguire was also able to generate word-picture associations during a novel card game targeting memory compensatory strategies with mod I.  He could recall 10/10 previously named category members from generative naming portion of task after an ~5 minute delay with mod I.  Mark Mcguire was left in bed with bed alarm set and call bell within reach.  Continue per current plan of care.    Function:  Eating Eating                 Cognition Comprehension Comprehension assist level: Follows complex conversation/direction with extra time/assistive device  Expression   Expression assist level: Expresses complex ideas: With extra time/assistive device  Social Interaction Social Interaction assist level: Interacts appropriately with others with medication or extra time (anti-anxiety, antidepressant).  Problem Solving Problem solving assist level: Solves complex problems: With extra time  Memory Memory assist level: More than  reasonable amount of time    Pain Pain Assessment Pain Assessment: No/denies pain  Therapy/Group: Individual Therapy  Mark Mcguire, Melanee SpryNicole L 02/05/2017, 12:59 PM

## 2017-02-05 NOTE — Progress Notes (Signed)
Occupational Therapy Session Note  Patient Details  Name: Mark LoseVernell Stidham Jr. MRN: [id number] Date of Birth: 06/17/1980  Today's Date: 02/05/2017 OT Individual Time: [id number] OT Individual Time Calculation (min): 75 min    Short Term Goals: Week 1:  OT Short Term Goal 1 (Week 1): Pt will be able to sit to EOB with min A to [facility]. OT Short Term Goal 2 (Week 1): [facility] A [facility]. OT Short Term Goal 3 (Week 1): Pt will be able to lean on R hip to be able to cleanse self in lateral lean on toilet. OT Short Term Goal 4 (Week 1): Pt will be able to don a shirt with mod A.  Skilled Therapeutic Interventions/Progress Updates:    Upon entering the room, pt supine in bed finishing breakfast this session. Pt with no c/o pain and agreeable to attempt shower this session. OT changed pt to hard collar in order to get out of bed.  Pt performed supine >sit with min A to EOB. Pt standing with platform RW with min A and ambulating 5' with mod A and having difficulty maintaining NWB R LE. Pt taking seated rest break and assisted into bathroom via wheelchair. Stand pivot transfer from wheelchair <> TTB with max A and use of grab bar for safety. Pt very anxious about new task. OT bagged R UE and LE for shower. Pt bathing while seated on TTB with increased time and assistance as needed. Pt fatigued quickly in shower. Pt transferred back int wheelchair and to bed for changing of wet hard collar pads. RN arrived for dressing change as well and pt donning clothing from supine level with set up A. Call bell and all needed items within reach.  Therapy Documentation Precautions:  Precautions Precautions: Cervical, Fall Required Braces or Orthoses: Sling, Cervical Brace, Other Brace/Splint Cervical Brace: At all times, Hard collar, Soft collar(Soft collar in bed, hard collar OOB) Other Brace/Splint: RLE CAM boot Restrictions Weight Bearing Restrictions: Yes RUE  Weight Bearing: Non weight bearing RLE Weight Bearing: Non weight bearing Other Position/Activity Restrictions: may weight bear through R elbow Pain: Pain Assessment Pain Assessment: No/denies pain ADL: ADL ADL Comments: Refer to functional navigator  See Function Navigator for Current Functional Status.   Therapy/Group: Individual Therapy  Alen BleacherBradsher, Aryah Doering P 02/05/2017, 12:35 PM

## 2017-02-05 NOTE — Progress Notes (Signed)
[place] PHYSICAL MEDICINE & REHABILITATION     PROGRESS NOTE    Subjective/Complaints: No new issues this morning.  Able to sleep fairly well.  Continues to have some discomfort in the right arm and leg  ROS: pt denies nausea, vomiting, diarrhea, cough, shortness of breath or chest pain   Objective: Vital Signs: Blood pressure 107/62, pulse 69, temperature 98.3 F (36.8 C), temperature source Oral, resp. rate 18, height 5\' 11"  (1.803 m), weight 98.8 kg (217 lb 13 oz), SpO2 99 %. No results found. No results for input(s): WBC, HGB, HCT, PLT in the last 72 hours. No results for input(s): NA, K, CL, GLUCOSE, BUN, CREATININE, CALCIUM in the last 72 hours.  Invalid input(s): CO CBG (last 3)  Recent Labs    02/04/17 1650 02/04/17 2024 02/05/17 0635  GLUCAP 111* 138* 146*    Wt Readings from Last 3 Encounters:  02/03/17 98.8 kg (217 lb 13 oz)  01/24/17 95.4 kg (210 lb 5.1 oz)  11/29/15 99.8 kg (220 lb)    Physical Exam:  Constitutional: He is oriented to person, place, and time. He appears well-developed and well-nourished. No distress.  HENT:  Head: Normocephalic and atraumatic.  Mouth/Throat: oral mucosa moist   scar mid forehead  Eyes: Conjunctivae are normal. Pupils are equal, round, and reactive to light.    Neck:  Soft collar in place.   Cardiovascular: Regular rate without JVD     Respiratory: C clear to auscultation with normal effort GI: Soft. Bowel sounds are normal. He exhibits no distension. There is no tenderness.  Musculoskeletal: He exhibits trace edema  .  Right forearm in splint RLE with   CAM boot in place     Neurological: He is alert and oriented to person, place, and time.  Mild dysarthria but completely intelligible  Oriented to place, reason he's here. Reasonable awareness Motor: RUE: limited by sling, hand grip 3+/5 LUE: 5/5 proximal to distal RLE: HF 2+/5, distally in boot, able to wiggle toes--remains stable, limited by pain LLE: 3+/5  proximal to distal Sensation intact to light touch in all 4's Skin: Sacral wound dressed    Psychiatric: pleasant and cooperative.  Sometimes anxious     Assessment/Plan: 1. 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 Physiatrist is providing close team supervision and 24 hour management of active medical problems listed below. Physiatrist and rehab team continue to assess barriers to discharge/monitor patient progress toward functional and medical goals.  Function:  Bathing Bathing position   Position: Other (comment)(LB bed and UB at sink)  Bathing parts Body parts bathed by patient: Chest, Abdomen, Front perineal area, Left upper leg, Left lower leg, Right upper leg, Buttocks Body parts bathed by helper: Left arm, Back  Bathing assist Assist Level: Touching or steadying assistance(Pt > 75%)      Upper Body Dressing/Undressing Upper body dressing   What is the patient wearing?: Pull over shirt/dress     Pull over shirt/dress - Perfomed by patient: Thread/unthread left sleeve, Put head through opening, Pull shirt over trunk Pull over shirt/dress - Perfomed by helper: Thread/unthread right sleeve        Upper body assist Assist Level: Touching or steadying assistance(Pt > 75%)      Lower Body Dressing/Undressing Lower body dressing   What is the patient wearing?: Non-skid slipper socks, Pants     Pants- Performed by patient: Thread/unthread left  pants leg Pants- Performed by helper: Thread/unthread right pants leg, Pull pants up/down Non-skid slipper socks- Performed by patient: Don/doff left sock                    Lower body assist Assist for lower body dressing: (max A)      Toileting Toileting   Toileting steps completed by patient: Adjust clothing prior to toileting, Performs perineal hygiene, Adjust clothing after  toileting(hospital gown) Toileting steps completed by helper: Adjust clothing prior to toileting, Performs perineal hygiene, Adjust clothing after toileting(per Jasmine Adger, NT)    Toileting assist Assist level: Touching or steadying assistance (Pt.75%)   Transfers Chair/bed transfer Chair/bed transfer activity did not occur: N/A Chair/bed transfer method: Stand pivot Chair/bed transfer assist level: Touching or steadying assistance (Pt > 75%) Chair/bed transfer assistive device: Walker, Designer, fashion/clothingArmrests     Locomotion Ambulation Ambulation activity did not occur: N/A   Max distance: 10 ft Assist level: 2 helpers   Education administratorWheelchair Wheelchair activity did not occur: N/A Type: Manual Max wheelchair distance: 50' Assist Level: Dependent (Pt equals 0%)  Cognition Comprehension Comprehension assist level: Understands complex 90% of the time/cues 10% of the time  Expression Expression assist level: Expresses complex ideas: With extra time/assistive device  Social Interaction Social Interaction assist level: Interacts appropriately 90% of the time - Needs monitoring or encouragement for participation or interaction.  Problem Solving Problem solving assist level: Solves basic 90% of the time/requires cueing < 10% of the time  Memory Memory assist level: Recognizes or recalls 90% of the time/requires cueing < 10% of the time   Medical Problem List and Plan: 1.  Cognitive deficits with delayed processing/sequencing, weight bearing restrictions on right as well as left sided weakness secondary to TBI/polytrauma.    -Continue PT OT and speech therapies.     -Showing cognitive improvements 2.  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     -continue therapeutic dose lovenox    -up as tolerated 3. Pain Management: Hydrocodone prn  4. Mood: LCSW to follow for evaluation and support.  5. Neuropsych: This patient is not capable of making decisions on his  own behalf. 6. Skin/Wound Care: routine pressure relief measures.   Appreciate wound care nurse assistance with sacral wound  7. Fluids/Electrolytes/Nutrition: Monitor I/O.  I personally reviewed the patient's labs today.  All normal.  8. HTN: Continue metoprolol, lisinopril and HCTZ.  Continue to monitor bid and titrate medications as indicated. 9. ABLA: hgb 9.7 today 10. T2DM: Hgb A1c- 8.5.   -sugars with improvement    -Increase Glucotrol to 10 mg twice daily.  Stop Lantus today    -Now on cm medium diet 11.Urinary retention: continue urecholine and Flomax for now.   Pt seems to be voiding.  12. Right open segmental tibial fracture s/p ORIF: NWB RLE with CAM boot on at all times---follow up X rays stable/improving.  13. Right distal radius fracture s/p ORIF: Ok to weight bear on right elbow-- follow up X rays stable/improving. Follow up with Dr. Kerry FortHaddick in 4 weeks.  14. Multifocal non-displaced C2 fracture: Collar on at all times--soft collar when in bed and hard collar before getting out of bed.   15.  Constipation: Laxative plus softener ordered.  Had 2 bowel movements yesterday   LOS (Days) 7 A FACE TO FACE EVALUATION WAS PERFORMED  Ranelle OysterSWARTZ,Laya Letendre T, MD 02/05/2017 8:57 AM

## 2017-02-05 NOTE — Progress Notes (Signed)
Physical Therapy Note  Patient Details  Name: Mark LoseVernell Walter Jr. MRN: [id number] Date of Birth: 09/04/1980 Today's Date: 02/05/2017  1438-1535, 57 min individual tx Pain: none per pt  Neuromuscular re-education via multimodal cues, demo, for 2 x 10  L LE straight leg raises, bil hip internal rotation, L ankle pumps, L ankle circles, L hip abduction, L long arc knee ext, 10 x 1 active assistive R straight leg raises, heel slides, hip abduction/adduction focusing on neutral rotation, bil glut sets; in R/L side lying 10 x 2 clam shells for hip abductor activation. VCs for pt to count aloud to avoid Valsalva maneuver.   Pt stated he has a hard time sleeping on his back all the time, and that it hurt his wound on his sacrum.  PT educated pt on side lying with pillow between knees .Rolling in flat bed, no rails with min assist to position in side lying.  Pt stated he would ask for help at night to get into side lying  Pt left resting in bed with alarm set and needs at hand.  Dream Harman 02/05/2017, 2:55 PM

## 2017-02-06 ENCOUNTER — Inpatient Hospital Stay (HOSPITAL_COMMUNITY): Payer: No Typology Code available for payment source | Admitting: Occupational Therapy

## 2017-02-06 ENCOUNTER — Inpatient Hospital Stay (HOSPITAL_COMMUNITY): Payer: Self-pay

## 2017-02-06 ENCOUNTER — Inpatient Hospital Stay (HOSPITAL_COMMUNITY): Payer: Self-pay | Admitting: Physical Therapy

## 2017-02-06 ENCOUNTER — Inpatient Hospital Stay (HOSPITAL_COMMUNITY): Payer: Self-pay | Admitting: Speech Pathology

## 2017-02-06 LAB — GLUCOSE, CAPILLARY
Glucose-Capillary: 109 mg/dL — ABNORMAL HIGH (ref 65–99)
Glucose-Capillary: 91 mg/dL (ref 65–99)
Glucose-Capillary: 65 mg/dL (ref 65–99)
Glucose-Capillary: 81 mg/dL (ref 65–99)

## 2017-02-06 MED ORDER — ASPIRIN EC 325 MG PO TBEC
325.0000 mg | DELAYED_RELEASE_TABLET | Freq: Every day | ORAL | Status: DC
  Administered 2017-02-07 – 2017-02-18 (×12): 325 mg via ORAL
  Filled 2017-02-06 (×12): qty 1

## 2017-02-06 NOTE — Progress Notes (Signed)
[facility] PHYSICAL MEDICINE & REHABILITATION     PROGRESS NOTE    Subjective/Complaints: Patient complains of moderate right clavicle/pectoral pain.  He was able to sleep.  It bothers him more with range of motion  ROS: pt denies nausea, vomiting, diarrhea, cough, shortness of breath or chest pain    Objective: Vital Signs: Blood pressure 127/66, pulse (!) 58, temperature (!) 97.5 F (36.4 C), temperature source Oral, resp. rate 18, height 5\' 11"  (1.803 m), weight 98.8 kg (217 lb 13 oz), SpO2 100 %. No results found. Recent Labs    02/05/17 1035  WBC 8.0  HGB 10.6*  HCT 33.4*  PLT 270   Recent Labs    02/05/17 1035  NA 136  K 3.6  CL 101  GLUCOSE 136*  BUN 9  CREATININE 0.55*  CALCIUM 9.4   CBG (last 3)  Recent Labs    02/05/17 0635 02/05/17 1131 02/05/17 1651  GLUCAP 146* 100* 97    Wt Readings from Last 3 Encounters:  02/03/17 98.8 kg (217 lb 13 oz)  01/24/17 95.4 kg (210 lb 5.1 oz)  11/29/15 99.8 kg (220 lb)    Physical Exam:  Constitutional: He is oriented to person, place, and time. He appears well-developed and well-nourished. No distress.  HENT:  Head: Normocephalic and atraumatic.  Mouth/Throat: oral mucosa moist   scar mid forehead  Eyes: Conjunctivae are normal. Pupils are equal, round, and reactive to light.    Neck:  Soft collar in place.   Cardiovascular: RRR without murmur. No JVD      Respiratory: CTA Bilaterally without wheezes or rales. Normal effort  GI: Soft. Bowel sounds are normal. He exhibits no distension. There is no tenderness.  Musculoskeletal: He exhibits trace edema  .  Right forearm in splint and fitting more appropriately.  Right pectoralis major/minor with tenderness upon palpation RLE with   CAM boot in place     Neurological: He is alert and oriented to person, place, and time.  Mild dysarthria but completely intelligible  Oriented to place, reason he's here. Reasonable awareness Motor: RUE: limited by sling, hand  grip 4/5 LUE: 5/5 proximal to distal RLE: HF 3/5, distally in boot, able to wiggle toes--remains stable, limited by pain LLE: 3+/5 proximal to distal Sensation intact to light touch in all 4's Skin: Sacral wound dressed    Psychiatric: Pleasant and cooperative     Assessment/Plan: 1. 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 Physiatrist is providing close team supervision and 24 hour management of active medical problems listed below. Physiatrist and rehab team continue to assess barriers to discharge/monitor patient progress toward functional and medical goals.  Function:  Bathing Bathing position   Position: Shower  Bathing parts Body parts bathed by patient: Chest, Abdomen, Front perineal area, Left upper leg, Left lower leg, Right upper leg Body parts bathed by helper: Buttocks, Left arm, Left lower leg, Back  Bathing assist Assist Level: (mod A)      Upper Body Dressing/Undressing Upper body dressing   What is the patient wearing?: Hospital gown     Pull over shirt/dress - Perfomed by patient: Thread/unthread left sleeve, Put head through opening, Pull shirt over trunk Pull over shirt/dress - Perfomed by helper: Thread/unthread right sleeve        Upper body assist Assist Level: Set up, Supervision or verbal cues   Set up : To obtain  clothing/put away  Lower Body Dressing/Undressing Lower body dressing   What is the patient wearing?: Non-skid slipper socks     Pants- Performed by patient: Thread/unthread left pants leg Pants- Performed by helper: Thread/unthread right pants leg, Pull pants up/down Non-skid slipper socks- Performed by patient: Don/doff left sock                    Lower body assist Assist for lower body dressing: (max A)      Toileting Toileting   Toileting steps completed by patient: Adjust clothing prior  to toileting, Performs perineal hygiene, Adjust clothing after toileting(hospital gown) Toileting steps completed by helper: Adjust clothing prior to toileting, Performs perineal hygiene, Adjust clothing after toileting(per Jasmine Adger, NT)    Toileting assist Assist level: Touching or steadying assistance (Pt.75%)   Transfers Chair/bed transfer Chair/bed transfer activity did not occur: N/A Chair/bed transfer method: Stand pivot Chair/bed transfer assist level: Touching or steadying assistance (Pt > 75%) Chair/bed transfer assistive device: Walker, Armrests     Locomotion Ambulation Ambulation activity did not occur: N/A   Max distance: 5' Assist level: Moderate assist (Pt 50 - 74%)   Wheelchair Wheelchair activity did not occur: N/A Type: Manual Max wheelchair distance: 7650' Assist Level: Dependent (Pt equals 0%)  Cognition Comprehension Comprehension assist level: Follows complex conversation/direction with extra time/assistive device  Expression Expression assist level: Expresses complex ideas: With extra time/assistive device  Social Interaction Social Interaction assist level: Interacts appropriately with others with medication or extra time (anti-anxiety, antidepressant).  Problem Solving Problem solving assist level: Solves complex problems: With extra time  Memory Memory assist level: More than reasonable amount of time   Medical Problem List and Plan: 1.  Cognitive deficits with delayed processing/sequencing, weight bearing restrictions on right as well as left sided weakness secondary to TBI/polytrauma.    -Continue PT OT and speech therapies.     -Making ongoing improvements 2.  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     -continue therapeutic dose lovenox    -up as tolerated 3. Pain Management: Hydrocodone prn    -Likely muscle strain and right PAC.  Recommend observation and conservative measures 4. Mood: LCSW to follow for evaluation and support.  5. Neuropsych: This patient  is not capable of making decisions on his  own behalf. 6. Skin/Wound Care: routine pressure relief measures.  Appreciate wound care nurse assistance with sacral wound  7. Fluids/Electrolytes/Nutrition: Monitor I/O.  I personally reviewed the patient's labs today.  All normal.  8. HTN: Continue metoprolol, lisinopril and HCTZ.  Continue to monitor bid and titrate medications as indicated. 9. ABLA: hgb 9.7 today 10. T2DM: Hgb A1c- 8.5.   -sugars under tight control    -Increased Glucotrol to 10 mg twice daily.  Lantus has been stopped    -Now on cm medium diet 11.Urinary retention: continue urecholine and Flomax for now.   Pt seems to be voiding.  12. Right open segmental tibial fracture s/p ORIF: NWB RLE with CAM boot on at all times---follow up X rays stable/improving.  13. Right distal radius fracture s/p ORIF: Ok to weight bear on right elbow-- follow up X rays stable/improving. Follow up with Dr. Kerry FortHaddick in 4 weeks.  14. Multifocal non-displaced C2 fracture: Collar on at all times--soft collar when in bed and hard collar before getting out of bed.   15.  Constipation: Laxative plus softener ordered.  Moving bowels.   LOS (Days) 8 A FACE TO FACE EVALUATION WAS PERFORMED  Zeriyah Wain  T, MD 02/06/2017 7:10 AM

## 2017-02-06 NOTE — Progress Notes (Signed)
Social Work Patient ID: Mark Shad., male   DOB: Apr 14, 1980, 36 y.o.   MRN: [id number]   Met with pt and spoke with his sister yesterday to review team conference.  Both aware and agreeable with targeted d/c date of 12/18 and supervision to min assist goals overall.  Pt reports that he believes he is making progress.  Somewhat more engaged and animated with me.  Will follow for support and d/c planning.  Mark Grayson, LCSW

## 2017-02-06 NOTE — Progress Notes (Signed)
Physical Therapy Session Note  Patient Details  Name: Mark LoseVernell Aina Jr. MRN: [id number] Date of Birth: 06/24/1980  Today's Date: 02/06/2017 PT Individual Time: 1300-1400 PT Individual Time Calculation (min): 60 min   Short Term Goals: Week 2:  PT Short Term Goal 1 (Week 2): Patient will transfer with S bed<> wheelchair PT Short Term Goal 2 (Week 2): Patient to ambulate with PFRW NWB R LE x 30' min-guard A PT Short Term Goal 3 (Week 2): Patient will tolerate standing activity for at least 15 minutes with 1-2 UE support and close S PT Short Term Goal 4 (Week 2): Patient will propel w/c 1050' with S  Skilled Therapeutic Interventions/Progress Updates:    Pt supine in bed upon therapist arrival, pt is agreeable to participate in therapy session. Pt reports no pain. Supine to sit SBA, stand pivot transfer bed to w/c with PFRW Min Assist for balance, v/c to adhere to WBing precautions on R LE. Ambulation 3 x 10 ft with PFRW and Min Assist for balance, v/c to adhere to [facility] precautions. Pt fatigues quickly with ambulation and also requires v/c to back up to chair and to reach behind with L UE for armrest when sitting in order to slow descent. Sit to/from stand x 5 reps from raised mat to PFRW with Min Assist, focus on maintaining WBing precautions on R LE, dynadisc under R LE. Pt reports increased difficulty with transfer with dynadisc due to inability to raise R LE up high enough because of hip flexor weakness. Standing L LE therex x 10 reps: hip flexion, hip abduction, hamstring curls, marches with Min Assist for balance while standing with PFRW. Pt left supine in bed at end of therapy session, needs in reach.  Therapy Documentation Precautions:  Precautions Precautions: Cervical, Fall Required Braces or Orthoses: Sling, Cervical Brace, Other Brace/Splint Cervical Brace: At all times, Hard collar, Soft collar(Soft collar in bed, hard collar OOB) Other Brace/Splint: RLE CAM  boot Restrictions Weight Bearing Restrictions: Yes RUE Weight Bearing: Non weight bearing RLE Weight Bearing: Non weight bearing Other Position/Activity Restrictions: may weight bear through R elbow     See Function Navigator for Current Functional Status.   Therapy/Group: Individual Therapy  Peter Congoaylor Vertie Dibbern, PT, DPT  02/06/2017, 3:35 PM

## 2017-02-06 NOTE — Progress Notes (Signed)
Speech Language Pathology Daily Session Note  Patient Details  Name: Mark LoseVernell Pacer Jr. MRN: [id number] Date of Birth: 03/13/1980  Today's Date: 02/06/2017 SLP Individual Time: 1100-1130 SLP Individual Time Calculation (min): 30 min  Short Term Goals: Week 1: SLP Short Term Goal 1 (Week 1): Patient will consume current diet with minimal overt s/s of aspiration with Mod I for use of swallowing compensatory strategies.  SLP Short Term Goal 2 (Week 1): Patient will demonstrate functional problem solving for complex tasks with Mod I.  SLP Short Term Goal 3 (Week 1): Patient will recall new, daily information with use of external aids with supervision verbal cues.   Skilled Therapeutic Interventions:  Skilled treatment session focused on cognition goals. SLP facilitated session by providing complex problem solving game (Guess Who). Pt was Mod I in completion of game, was able to demonstrate anticipatory awareness and demonstrated Mod I recall of previous activities within therapy on previous day and this day. Pt left in wheelchair with all needs within reach. I notified nursing that pt wanted to go back to bed.      Function:    Cognition Comprehension Comprehension assist level: Follows complex conversation/direction with extra time/assistive device  Expression   Expression assist level: Expresses complex ideas: With extra time/assistive device  Social Interaction Social Interaction assist level: Interacts appropriately with others with medication or extra time (anti-anxiety, antidepressant).  Problem Solving Problem solving assist level: Solves complex problems: With extra time  Memory Memory assist level: More than reasonable amount of time    Pain Pain Assessment Pain Assessment: 0-10 Pain Score: 0-No pain  Therapy/Group: Individual Therapy  Khayri Kargbo 02/06/2017, 12:07 PM

## 2017-02-06 NOTE — Progress Notes (Signed)
Occupational Therapy Session Note  Patient Details  Name: Mark LoseVernell Winborne Jr. MRN: [id number] Date of Birth: 08/18/1980  Today's Date: 02/06/2017 OT Individual Time: [id number] OT Individual Time Calculation (min): 74 min    Short Term Goals: Week 1:  OT Short Term Goal 1 (Week 1): Pt will be able to sit to EOB with min A to [facility]. OT Short Term Goal 2 (Week 1): [facility] max A of [facility]. OT Short Term Goal 3 (Week 1): Pt will be able to lean on R hip to be able to cleanse self in lateral lean on toilet. OT Short Term Goal 4 (Week 1): Pt will be able to don a shirt with mod A.  Skilled Therapeutic Interventions/Progress Updates:    Upon entering the room, pt supine in bed with c/o soreness in R shoulder this session. Pt agreeable to OT intervention. OT removed R UE splint for hygiene and RN changed dressing around hardwear. Pt continues to have several questions regarding time frame for precautions and seems to be more anxious regarding topic today. Pt tearful but reports, " I am doing my bestevery day and I know God has a plan." OT provided therapeutic use of self and pt reports feeling motivated for therapy. From flat bed heat applied to R shoulder and OT providing AAROM to shoulder in all planes x 5 reps. Pt then actively moving R shoulder in all planes x 5 reps with increased time and focus on purse lip breathing. Heat placed back on shoulder at end of session. Pt remaining in bed with call bell and all needed items within reach upon exiting the room.   Therapy Documentation Precautions:  Precautions Precautions: Cervical, Fall Required Braces or Orthoses: Sling, Cervical Brace, Other Brace/Splint Cervical Brace: At all times, Hard collar, Soft collar(Soft collar in bed, hard collar OOB) Other Brace/Splint: RLE CAM boot Restrictions Weight Bearing Restrictions: Yes RUE Weight Bearing: Non weight bearing RLE Weight Bearing: Non weight  bearing Other Position/Activity Restrictions: may weight bear through R elbow General:   Vital Signs: Therapy Vitals Pulse Rate: 66 BP: 130/68 Pain: Pain Assessment Pain Assessment: 0-10 Pain Score: 0-No pain ADL: ADL ADL Comments: Refer to functional navigator Vision   Perception    Praxis   Exercises:   Other Treatments:    See Function Navigator for Current Functional Status.   Therapy/Group: Individual Therapy  Alen BleacherBradsher, Stephie Xu P 02/06/2017, 11:15 AM

## 2017-02-06 NOTE — Progress Notes (Addendum)
Physical Therapy Weekly Progress Note  Patient Details  Name: Mark Mcguire. MRN: [id number] Date of Birth: Jul 03, 1980  Beginning of progress report period: February 01, 2017 End of progress report period: February 06, 2017  Today's Date: 02/06/2017 PT Individual Time: 1015-1100 PT Individual Time Calculation (min): 45 min   Patient has met 3 of 5 short term goals.  Patient remains unable to propel his w/c due to R UE weight bearing limitations and LE's do not reach floor in current chair due to seat cushion.  He has ambulated min a 12', but limited by encurane due to NWB and fatigues quickly.    Patient continues to demonstrate the following deficits muscle weakness, unbalanced muscle activation and decreased coordination and decreased safety awareness and decreased memory and therefore will continue to benefit from skilled PT intervention to increase functional independence with mobility.  Patient progressing toward long term goals..  Continue plan of care.  PT Short Term Goals Week 1:  PT Short Term Goal 1 (Week 1): Pt will transfer bed<>chair w/ Min assist PT Short Term Goal 1 - Progress (Week 1): Met PT Short Term Goal 2 (Week 1): Pt will ambulate w/ LRAD 25' w/ Min assist PT Short Term Goal 2 - Progress (Week 1): Progressing toward goal PT Short Term Goal 3 (Week 1): Pt will adhere to WB precautions w/ minimal verbal cues PT Short Term Goal 3 - Progress (Week 1): Met PT Short Term Goal 4 (Week 1): Pt will tolerate 30 minutes of OOB activity w/o increase in fatigue PT Short Term Goal 4 - Progress (Week 1): Met PT Short Term Goal 5 (Week 1): Pt will self-propel w/c 66' w/ Min A  PT Short Term Goal 5 - Progress (Week 1): Not progressing Week 2:  PT Short Term Goal 1 (Week 2): Patient will transfer with S bed<> wheelchair PT Short Term Goal 2 (Week 2): Patient to ambulate with PFRW NWB R LE x 30' min-guard A PT Short Term Goal 3 (Week 2): Patient will tolerate standing activity for  at least 15 minutes with 1-2 UE support and close S PT Short Term Goal 4 (Week 2): Patient will propel w/c 48' with S  Skilled Therapeutic Interventions/Progress Updates:    Patient in supine and was able to verbalize all precautions with questioning and gestural cues.  Patient supine to sit with min A and rails.  Performed squat pivot to w/c min A and cues.  Reported unable to propel his w/c, but noted also R legrest not latching properly.  Transported to gym in w/c with staff assist.  Performed stand pivot to mat with PFRW and min A, cues for NWB R LE.  Sit to supine min A and pt performed LE therex to include R SAQ 3 x 10 reps, R hip abduction 2 x 10 reps, performed heel slides on R with A, and hooklying hip adduction x 5 with 5 sec hold.  Patient supine to sit min A and cues.  While pt performing therex attempted to tighten leg rest pegs on w//c but unable so switched w/c to allow leg rest to latch.  Patient performed sit to stand min A and cues for hand placement and ambulated 12' min A with PFRW.  In w/c transported to room and left with all needs in reach and hand off to SLP.  Therapy Documentation Precautions:  Precautions Precautions: Cervical, Fall Required Braces or Orthoses: Sling, Cervical Brace, Other Brace/Splint Cervical Brace: At all times, Hard collar,  Soft collar(Soft collar in bed, hard collar OOB) Other Brace/Splint: RLE CAM boot Restrictions Weight Bearing Restrictions: Yes RUE Weight Bearing: Non weight bearing RLE Weight Bearing: Non weight bearing Other Position/Activity Restrictions: may weight bear through R elbow Pain: Pain Assessment Pain Score: 0-No pain  See Function Navigator for Current Functional Status.  Therapy/Group: Individual Therapy  Reginia Naas 02/06/2017, 1:03 PM
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
## 2017-02-07 ENCOUNTER — Inpatient Hospital Stay (HOSPITAL_COMMUNITY): Payer: No Typology Code available for payment source | Admitting: Speech Pathology

## 2017-02-07 ENCOUNTER — Inpatient Hospital Stay (HOSPITAL_COMMUNITY): Payer: Self-pay

## 2017-02-07 ENCOUNTER — Inpatient Hospital Stay (HOSPITAL_COMMUNITY): Payer: Self-pay | Admitting: Physical Therapy

## 2017-02-07 LAB — GLUCOSE, CAPILLARY
Glucose-Capillary: 116 mg/dL — ABNORMAL HIGH (ref 65–99)
Glucose-Capillary: 110 mg/dL — ABNORMAL HIGH (ref 65–99)
Glucose-Capillary: 95 mg/dL (ref 65–99)
Glucose-Capillary: 133 mg/dL — ABNORMAL HIGH (ref 65–99)

## 2017-02-07 MED ORDER — BETHANECHOL CHLORIDE 10 MG PO TABS
10.0000 mg | ORAL_TABLET | Freq: Three times a day (TID) | ORAL | Status: DC
  Administered 2017-02-07 – 2017-02-11 (×12): 10 mg via ORAL
  Filled 2017-02-07 (×12): qty 1

## 2017-02-07 MED ORDER — SORBITOL 70 % SOLN: 30.0000 mL | Freq: Every day | Status: DC | PRN

## 2017-02-07 NOTE — Progress Notes (Signed)
[facility]IC[facility]IT[facility]     PROGRESS NOTE    Subjective/Complaints: Patient sitting up with speech therapy.  Has no new complaints today.  Had questions again about his fracture healing.  Otherwise doing fairly well  ROS: pt denies nausea, vomiting, diarrhea, cough, shortness of breath or chest pain    Objective: Vital Signs: Blood pressure 123/78, pulse 76, temperature 97.8 F (36.6 C), temperature source Axillary, resp. rate 18, height 5\' 11"  (1.803 m), weight 98.8 kg (217 lb 13 oz), SpO2 100 %. No results found. Recent Labs    02/05/17 1035  WBC 8.0  HGB 10.6*  HCT 33.4*  PLT 270   Recent Labs    02/05/17 1035  NA 136  K 3.6  CL 101  GLUCOSE 136*  BUN 9  CREATININE 0.55*  CALCIUM 9.4   CBG (last 3)  Recent Labs    02/06/17 2122 02/06/17 2156 02/07/17 0643  GLUCAP 65 81 116*    Wt Readings from Last 3 Encounters:  02/03/17 98.8 kg (217 lb 13 oz)  01/24/17 95.4 kg (210 lb 5.1 oz)  11/29/15 99.8 kg (220 lb)    Physical Exam:  Constitutional: He is oriented to person, place, and time. He appears well-developed and well-nourished. No distress.  HENT:  Head: Normocephalic and atraumatic.  Mouth/Throat: oral mucosa moist   scar mid forehead  Eyes: Conjunctivae are normal. Pupils are equal, round, and reactive to light.    Neck:  Soft collar in place.   Cardiovascular: RRR without murmur. No JVD      Respiratory: Normal effort GI: Soft. Bowel sounds are normal. He exhibits no distension. There is no tenderness.  Musculoskeletal: He exhibits trace edema  .  Right forearm in splint and fitting more appropriately.  Right pectoralis major/minor with tenderness upon palpation RLE with   CAM boot in place     Neurological: He is alert and oriented to person, place, and time.  Mild dysarthria but completely intelligible  Oriented to place, reason he's here.  Improved insight and awareness.  Still some short-term memory issues Motor: RUE:  limited by sling, hand grip 4/5 LUE: 5/5 proximal to distal RLE: HF 3/5, distally in boot, able to wiggle toes--remains stable, limited by pain--stable LLE: 3+/5 proximal to distal--stable Sensation intact to light touch in all 4's Skin: Sacral wound dressed    Psychiatric: Pleasant and cooperative     Assessment/Plan: 1. 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 Physiatrist is providing close team supervision and 24 hour management of active medical problems listed below. Physiatrist and rehab team continue to assess barriers to discharge/monitor patient progress toward functional and medical goals.  Function:  Bathing Bathing position   Position: Shower  Bathing parts Body parts bathed by patient: Chest, Abdomen, Front perineal area, Left upper leg, Left lower leg, Right upper leg Body parts bathed by helper: Buttocks, Left arm, Left lower leg, Back  Bathing assist Assist Level: (mod A)      Upper Body Dressing/Undressing Upper body dressing   What is the patient wearing?: Hospital gown     Pull over shirt/dress - Perfomed by patient: Thread/unthread left sleeve, Put head through opening, Pull shirt over trunk Pull over shirt/dress - Perfomed by helper: Thread/unthread right sleeve        Upper body assist Assist Level: Set up, Supervision or verbal cues   Set up :  To obtain clothing/put away  Lower Body Dressing/Undressing Lower body dressing   What is the patient wearing?: Non-skid slipper socks     Pants- Performed by patient: Thread/unthread left pants leg Pants- Performed by helper: Thread/unthread right pants leg, Pull pants up/down Non-skid slipper socks- Performed by patient: Don/doff left sock                    Lower body assist Assist for lower body dressing: (max A)      Toileting Toileting   Toileting steps  completed by patient: Adjust clothing prior to toileting, Performs perineal hygiene, Adjust clothing after toileting Toileting steps completed by helper: Adjust clothing prior to toileting, Performs perineal hygiene, Adjust clothing after toileting(per Jasmine Adger, NT)    Toileting assist Assist level: Touching or steadying assistance (Pt.75%)   Transfers Chair/bed transfer Chair/bed transfer activity did not occur: N/A Chair/bed transfer method: Stand pivot Chair/bed transfer assist level: Touching or steadying assistance (Pt > 75%) Chair/bed transfer assistive device: Armrests, Patent attorneyWalker     Locomotion Ambulation Ambulation activity did not occur: N/A   Max distance: 10 ft Assist level: Touching or steadying assistance (Pt > 75%)   Wheelchair Wheelchair activity did not occur: N/A Type: Manual Max wheelchair distance: 50' Assist Level: Dependent (Pt equals 0%)  Cognition Comprehension Comprehension assist level: Follows complex conversation/direction with extra time/assistive device  Expression Expression assist level: Expresses complex ideas: With extra time/assistive device  Social Interaction Social Interaction assist level: Interacts appropriately with others with medication or extra time (anti-anxiety, antidepressant).  Problem Solving Problem solving assist level: Solves complex problems: With extra time  Memory Memory assist level: More than reasonable amount of time   Medical Problem List and Plan: 1.  Cognitive deficits with delayed processing/sequencing, weight bearing restrictions on right as well as left sided weakness secondary to TBI/polytrauma.    -Continue PT OT and speech therapies.     -Making ongoing improvements 2.  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     -continue therapeutic dose lovenox    -up as tolerated 3. Pain Management: Hydrocodone prn    -Heat and conservative management for right pectoralis strain 4. Mood: LCSW to follow for evaluation and support.  5.  Neuropsych: This patient is not capable of making decisions on his  own behalf. 6. Skin/Wound Care: routine pressure relief measures.  Appreciate wound care nurse assistance with sacral wound  7. Fluids/Electrolytes/Nutrition: Monitor I/O.   Good p.o. intake at present 8. HTN: Continue metoprolol, lisinopril and HCTZ.  Continue to monitor bid and titrate medications as indicated. 9. ABLA: hgb 9.7 today 10. T2DM: Hgb A1c- 8.5.   -sugars under tight control off insulin    -Increased Glucotrol to 10 mg twice daily.     - cm medium diet 11.Urinary retention: continue urecholine and Flomax for now.   Pt seems to be voiding.  12. Right open segmental tibial fracture s/p ORIF: NWB RLE with CAM boot on at all times---follow up X rays stable/improving.  13. Right distal radius fracture s/p ORIF: Ok to weight bear on right elbow-- follow up X rays stable/improving. Follow up with Dr. Kerry FortHaddick in 4 weeks.  14. Multifocal non-displaced C2 fracture: Collar on at all times--soft collar when in bed and hard collar before getting out of bed.   15.  Constipation: Laxative plus softener ordered.  Sorbitol today  LOS (Days) 9 A FACE TO FACE EVALUATION WAS PERFORMED  Ranelle OysterSWARTZ,Terrin Imparato T, MD 02/07/2017 9:12 AM

## 2017-02-07 NOTE — Progress Notes (Signed)
Speech Language Pathology Discharge Summary  Patient Details  Name: Mark Mcguire. MRN: [id number] Date of Birth: 02-13-1981  Today's Date: 02/07/2017 SLP Individual Time: 0815-0915 SLP Individual Time Calculation (min): 60 min   Skilled Therapeutic Interventions:  Skilled treatment session focused on cognition goals. SLP facilitated session by providing Mod I for completion of novel complex navigation task (map worksheet). Pt able to recall information form previous day as well as recall information after delay once schedule was read to him. Pt continues with appropriate questions regarding time framework for brace. Suspect decreased recall is because of hospitalization (sensation that one day leads into another day). MD in to assess pt for rounding and SLP went over discharge from [place] with pt and MD agreeable.     Patient has met 4 of 4 long term goals.  Patient to discharge at overall Modified Independent level.   Clinical Impression/Discharge Summary:   Pt has made great progress in skilled ST services and as a result has met 4 of 4 LTGs. Pt is currently at a Mod I level with appropriate support available at discharge from CIR. Anticipate that pt with achieve increased independence as he returns to functional routine within home environment.        Recommendation:  None      Reasons for discharge: Treatment goals met   Patient/Family Agrees with Progress Made and Goals Achieved: Yes   Function:  Eating Eating   Modified Consistency Diet: No Eating Assist Level: Set up assist for   Eating Set Up Assist For: Opening containers       Cognition Comprehension Comprehension assist level: Follows complex conversation/direction with extra time/assistive device  Expression   Expression assist level: Expresses complex ideas: With extra time/assistive device  Social Interaction Social Interaction assist level: Interacts appropriately with others with medication or extra time  (anti-anxiety, antidepressant).  Problem Solving Problem solving assist level: Solves complex problems: With extra time  Memory Memory assist level: More than reasonable amount of time   [name] 02/07/2017, 8:27 AM

## 2017-02-07 NOTE — Progress Notes (Signed)
Physical Therapy Session Note  Patient Details  Name: Mark LoseVernell Greiner Jr. MRN: [id number] Date of Birth: 01/22/1981  Today's Date: 02/07/2017 PT Individual Time: 1430-1530 PT Individual Time Calculation (min): 60 min   Short Term Goals: Week 2:  PT Short Term Goal 1 (Week 2): Patient will transfer with S bed<> wheelchair PT Short Term Goal 2 (Week 2): Patient to ambulate with PFRW NWB R LE x 30' min-guard A PT Short Term Goal 3 (Week 2): Patient will tolerate standing activity for at least 15 minutes with 1-2 UE support and close S PT Short Term Goal 4 (Week 2): Patient will propel w/c 2350' with S  Skilled Therapeutic Interventions/Progress Updates:    Pt supine in bed upon therapist arrival, agreeable to participate in therapy session. Pt reports no pain this PM and that he just received pain medication from nursing. Set pt up with shorter w/c to trial w/c mobility. Pt performs bed mobility with S. Stand pivot transfer bed to/from w/c with Min Assist and PFRW. Verbal and visual demonstration education on how to propel w/c using L UE and LE. Manual w/c propulsion 3 x 75 ft with Supervision across hard level surface, carpet, and through narrow spaces with turns to simulate home environment. Ambulation x 30 ft with 2 standing rest breaks with PFRW, Min Assist for balance. Pt left supine in bed with needs in reach at end of therapy session.  Therapy Documentation Precautions:  Precautions Precautions: Cervical, Fall Required Braces or Orthoses: Sling, Cervical Brace, Other Brace/Splint Cervical Brace: At all times, Hard collar, Soft collar(Soft collar in bed, hard collar OOB) Other Brace/Splint: RLE CAM boot Restrictions Weight Bearing Restrictions: Yes RUE Weight Bearing: Non weight bearing RLE Weight Bearing: Non weight bearing Other Position/Activity Restrictions: may weight bear through R elbow  See Function Navigator for Current Functional Status.   Therapy/Group: Individual  Therapy  Peter Congoaylor Anamae Rochelle, PT, DPT  02/07/2017, 3:42 PM

## 2017-02-07 NOTE — Progress Notes (Signed)
Occupational Therapy Session Note  Patient Details  Name: Mark Mcguire. MRN: [id number] Date of Birth: [birth year]  Today's Date: 02/07/2017 OT Individual Time: 9967-2277 OT Individual Time Calculation (min): 75 min    Short Term Goals: Week 1:  OT Short Term Goal 1 (Week 1): Pt will be able to sit to EOB with min A to prepare for [facility] transfer. OT Short Term Goal 2 (Week 1): Pt will be able to transfer to [facility] with max A of 1. OT Short Term Goal 3 (Week 1): Pt will be able to lean on R hip to be able to cleanse self in lateral lean on toilet. OT Short Term Goal 4 (Week 1): Pt will be able to don a shirt with mod A.  Skilled Therapeutic Interventions/Progress Updates:    1:1. Pt completes bathing EOB with supervision and A to wash back. Pt able to leans  laterally onto R elbow with LUE cleaning buttocks. OT reapplies dressing to buttocks. Pt dons hospital gown d/t not having any other clothes. Pt stand pivot transfer throuhgout session with min guard and platform walker with VC for NWB precautions EOB<>BSC<>w/c<>EOM. Pt completes shoulder AROM/AAROM exercises with towel glides on table 2x15, elbow flex/ext, and shoulder ab/adduc with assist from OT to achieve shoulder level abduction. Exited session with pt seated in bed with call light in reach and all needs met.   Therapy Documentation Precautions:  Precautions Precautions: Cervical, Fall Required Braces or Orthoses: Sling, Cervical Brace, Other Brace/Splint Cervical Brace: At all times, Hard collar, Soft collar(Soft collar in bed, hard collar OOB) Other Brace/Splint: RLE CAM boot Restrictions Weight Bearing Restrictions: Yes RUE Weight Bearing: Non weight bearing RLE Weight Bearing: Non weight bearing Other Position/Activity Restrictions: may weight bear through R elbow General:   Vital Signs: Therapy Vitals Temp: 97.8 F (36.6 C) Temp Source: Axillary Pulse Rate: 76 Resp: 18 BP: 123/78 Patient Position (if appropriate):  Lying Oxygen Therapy SpO2: 100 % O2 Device: Not Delivered  See Function Navigator for Current Functional Status.   Therapy/Group: Individual Therapy  Tonny Branch 02/07/2017, 10:21 AM

## 2017-02-08 DIAGNOSIS — S069X9D Unspecified intracranial injury with loss of consciousness of unspecified duration, subsequent encounter: Secondary | ICD-10-CM

## 2017-02-08 DIAGNOSIS — T07XXXA Unspecified multiple injuries, initial encounter: Secondary | ICD-10-CM

## 2017-02-08 LAB — GLUCOSE, CAPILLARY
GLUCOSE-CAPILLARY: 132 mg/dL — AB (ref 65–99)
GLUCOSE-CAPILLARY: 168 mg/dL — AB (ref 65–99)
Glucose-Capillary: 113 mg/dL — ABNORMAL HIGH (ref 65–99)
Glucose-Capillary: 84 mg/dL (ref 65–99)

## 2017-02-08 NOTE — Progress Notes (Signed)
[place] PHYSICAL MEDICINE & REHABILITATION     PROGRESS NOTE    Subjective/Complaints: Patient denies any neck pain arm pain or leg pain on the right side.  Left side feels fine.  Has had some constipation although this appears to be improving.  ROS: pt denies nausea, vomiting, diarrhea, cough, shortness of breath or chest pain    Objective: Vital Signs: Blood pressure 126/77, pulse 75, temperature 97.9 F (36.6 C), temperature source Oral, resp. rate 18, height 5\' 11"  (1.803 m), weight 98.8 kg (217 lb 13 oz), SpO2 97 %. No results found. No results for input(s): WBC, HGB, HCT, PLT in the last 72 hours. No results for input(s): NA, K, CL, GLUCOSE, BUN, CREATININE, CALCIUM in the last 72 hours.  Invalid input(s): CO CBG (last 3)  Recent Labs    02/07/17 2156 02/08/17 0701 02/08/17 1140  GLUCAP 133* 132* 168*    Wt Readings from Last 3 Encounters:  02/03/17 98.8 kg (217 lb 13 oz)  01/24/17 95.4 kg (210 lb 5.1 oz)  11/29/15 99.8 kg (220 lb)    Physical Exam:  Constitutional: He is oriented to person, place, and time. He appears well-developed and well-nourished. No distress.  HENT:  Head: Normocephalic and atraumatic.  Mouth/Throat: oral mucosa moist   scar mid forehead  Eyes: Conjunctivae are normal. Pupils are equal, round, and reactive to light.    Neck:  Soft collar in place.   Cardiovascular: RRR without murmur. No JVD      Respiratory: Normal effort GI: Soft. Bowel sounds are normal. He exhibits no distension. There is no tenderness.  Musculoskeletal: He exhibits trace edema  .  Right forearm in splint and fitting more appropriately.  Right pectoralis major/minor with tenderness upon palpation RLE with   CAM boot in place     Neurological: He is alert and oriented to person, place, and time.  Mild dysarthria but completely intelligible  Oriented to place, reason he's here.  Improved insight and awareness.  Still some short-term memory issues Motor: RUE:  limited by sling, hand grip 4/5 LUE: 5/5 proximal to distal RLE: HF 3/5, distally in boot, able to wiggle toes--remains stable, limited by pain--stable LLE: 3+/5 proximal to distal--stable Sensation intact to light touch in all 4's Skin: Sacral wound dressed    Psychiatric: Pleasant and cooperative     Assessment/Plan: 1. 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 Physiatrist is providing close team supervision and 24 hour management of active medical problems listed below. Physiatrist and rehab team continue to assess barriers to discharge/monitor patient progress toward functional and medical goals.  Function:  Bathing Bathing position   Position: Shower  Bathing parts Body parts bathed by patient: Chest, Abdomen, Front perineal area, Left upper leg, Left lower leg, Right upper leg Body parts bathed by helper: Buttocks, Left arm, Left lower leg, Back  Bathing assist Assist Level: (mod A)      Upper Body Dressing/Undressing Upper body dressing   What is the patient wearing?: Hospital gown     Pull over shirt/dress - Perfomed by patient: Thread/unthread left sleeve, Put head through opening, Pull shirt over trunk Pull over shirt/dress - Perfomed by helper: Thread/unthread right sleeve        Upper body assist Assist Level: Set up, Supervision or verbal cues   Set up : To obtain clothing/put away  Lower Body Dressing/Undressing Lower body dressing  What is the patient wearing?: Non-skid slipper socks     Pants- Performed by patient: Thread/unthread left pants leg Pants- Performed by helper: Thread/unthread right pants leg, Pull pants up/down Non-skid slipper socks- Performed by patient: Don/doff left sock                    Lower body assist Assist for lower body dressing: (max A)      Toileting Toileting Toileting activity did  not occur: Safety/medical concerns Toileting steps completed by patient: Adjust clothing prior to toileting, Performs perineal hygiene, Adjust clothing after toileting Toileting steps completed by helper: Adjust clothing prior to toileting, Performs perineal hygiene, Adjust clothing after toileting    Toileting assist Assist level: Touching or steadying assistance (Pt.75%)   Transfers Chair/bed transfer Chair/bed transfer activity did not occur: N/A Chair/bed transfer method: Stand pivot Chair/bed transfer assist level: Touching or steadying assistance (Pt > 75%) Chair/bed transfer assistive device: Armrests, Patent attorney Ambulation activity did not occur: N/A   Max distance: 30 ft Assist level: Touching or steadying assistance (Pt > 75%)   Wheelchair Wheelchair activity did not occur: N/A Type: Manual Max wheelchair distance: 75 ft Assist Level: Supervision or verbal cues  Cognition Comprehension Comprehension assist level: Understands basic 90% of the time/cues < 10% of the time  Expression Expression assist level: Expresses complex 90% of the time/cues < 10% of the time  Social Interaction Social Interaction assist level: Interacts appropriately with others with medication or extra time (anti-anxiety, antidepressant).  Problem Solving Problem solving assist level: Solves complex problems: With extra time  Memory Memory assist level: More than reasonable amount of time   Medical Problem List and Plan: 1.  Cognitive deficits with delayed processing/sequencing, weight bearing restrictions on right as well as left sided weakness secondary to TBI/polytrauma.    -Continue PT OT and speech therapies.     -Making ongoing improvements 2.  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     -continue therapeutic dose lovenox, no breathing difficulties    -up as tolerated 3. Pain Management: Hydrocodone prn, pain control appears adequate   -Heat and conservative management for right  pectoralis strain 4. Mood: LCSW to follow for evaluation and support.  5. Neuropsych: This patient is not capable of making decisions on his  own behalf. 6. Skin/Wound Care: routine pressure relief measures.  Appreciate wound care nurse assistance with sacral wound  7. Fluids/Electrolytes/Nutrition: Monitor I/O.   Good p.o. intake at present 8. HTN: Continue metoprolol, lisinopril and HCTZ.  Continue to monitor bid and titrate medications as indicated.  Blood pressure controlled 02/08/2017 Vitals:   02/08/17 0512 02/08/17 0825  BP: 118/70 126/77  Pulse: 66 75  Resp: 18   Temp: 97.9 F (36.6 C)   SpO2: 97%    9. ABLA: hgb 9.7 today 10. T2DM: Hgb A1c- 8.5. CBG (last 3)  Recent Labs    02/07/17 2156 02/08/17 0701 02/08/17 1140  GLUCAP 133* 132* 168*     -sugars under tight control off insulinncreased Glucotrol to 10 mg twice daily.     - cm medium diet 11.Urinary retention: continue urecholine and Flomax for now.   Pt seems to be voiding.  12. Right open segmental tibial fracture s/p ORIF: NWB RLE with CAM boot on at all times---follow up X rays stable/improving.  13. Right distal radius fracture s/p ORIF: Ok to weight bear on right elbow-- follow up X rays stable/improving. Follow up with Dr. Kerry Fort in 4  weeks.  14. Multifocal non-displaced C2 fracture: Collar on at all times--soft collar when in bed and hard collar before getting out of bed.   15.  Constipation: Laxative plus softener ordered.  Sorbitol today  LOS (Days) 10 A FACE TO FACE EVALUATION WAS PERFORMED  Erick ColaceAndrew E Senna Lape, MD 02/08/2017 12:12 PM

## 2017-02-09 ENCOUNTER — Inpatient Hospital Stay (HOSPITAL_COMMUNITY): Payer: Self-pay

## 2017-02-09 LAB — GLUCOSE, CAPILLARY
GLUCOSE-CAPILLARY: 150 mg/dL — AB (ref 65–99)
Glucose-Capillary: 152 mg/dL — ABNORMAL HIGH (ref 65–99)
Glucose-Capillary: 116 mg/dL — ABNORMAL HIGH (ref 65–99)
Glucose-Capillary: 90 mg/dL (ref 65–99)

## 2017-02-09 NOTE — Progress Notes (Signed)
Occupational Therapy Session Note  Patient Details  Name: Mark LoseVernell Wedin Jr. MRN: [id number] Date of Birth: 04/21/1980  Today's Date: 02/09/2017 OT Individual Time: 0900-1000 OT Individual Time Calculation (min): 60 min     Skilled Therapeutic Interventions/Progress Updates:    OT intervention with focus on self-care (bathing) while seated EOB, functional transfers, and w/c mobility.  Pt performed supine>sit EOB for bathing tasks at supervision level.  Pt stood from EOB to bathe buttocks and front perineal area.  Pt donned hospital gown d/t no clothing.  Pt performed all tasks at supervision level.  Pt able to state WBing precautions and follow precautions independently.  Pt propelled w/c from room to ADL apartment and practiced furniture transfers with steady A.  Pt returned to room and remained in w/c with all needs within reach.   Therapy Documentation Precautions:  Precautions Precautions: Cervical, Fall Required Braces or Orthoses: Sling, Cervical Brace, Other Brace/Splint Cervical Brace: At all times, Hard collar, Soft collar(Soft collar in bed, hard collar OOB) Other Brace/Splint: RLE CAM boot Restrictions Weight Bearing Restrictions: Yes RUE Weight Bearing: Weight bear through elbow only RLE Weight Bearing: Non weight bearing Other Position/Activity Restrictions: may weight bear through R elbow Pain:  Pt denies pain  See Function Navigator for Current Functional Status.   Therapy/Group: Individual Therapy  Rich BraveLanier, Mark Mcguire 02/09/2017, 10:02 AM

## 2017-02-09 NOTE — Progress Notes (Signed)
[place] PHYSICAL MEDICINE & REHABILITATION     PROGRESS NOTE    Subjective/Complaints: No neck or Right sided arm or leg pains  ROS: pt denies nausea, vomiting, diarrhea, cough, shortness of breath or chest pain    Objective: Vital Signs: Blood pressure 129/68, pulse 77, temperature 97.7 F (36.5 C), temperature source Oral, resp. rate 18, height 5\' 11"  (1.803 m), weight 98.8 kg (217 lb 13 oz), SpO2 99 %. No results found. No results for input(s): WBC, HGB, HCT, PLT in the last 72 hours. No results for input(s): NA, K, CL, GLUCOSE, BUN, CREATININE, CALCIUM in the last 72 hours.  Invalid input(s): CO CBG (last 3)  Recent Labs    02/08/17 1643 02/08/17 2110 02/09/17 0636  GLUCAP 113* 84 152*    Wt Readings from Last 3 Encounters:  02/03/17 98.8 kg (217 lb 13 oz)  01/24/17 95.4 kg (210 lb 5.1 oz)  11/29/15 99.8 kg (220 lb)    Physical Exam:  Constitutional: He is oriented to person, place, and time. He appears well-developed and well-nourished. No distress.  HENT:  Head: Normocephalic and atraumatic.  Mouth/Throat: oral mucosa moist   scar mid forehead  Eyes: Conjunctivae are normal. Pupils are equal, round, and reactive to light.    Neck:  Soft collar in place.   Cardiovascular: RRR without murmur. No JVD      Respiratory: Normal effort GI: Soft. Bowel sounds are normal. He exhibits no distension. There is no tenderness.  Musculoskeletal: He exhibits trace edema  .  Right forearm in splint and fitting more appropriately.  Right pectoralis major/minor with tenderness upon palpation RLE with   CAM boot in place     Neurological: He is alert and oriented to person, place, and time.  Mild dysarthria but completely intelligible  Oriented to place, reason he's here.  Improved insight and awareness.  Still some short-term memory issues Motor: RUE: 3/5 Delt , biceps hand grip 4/5 LUE: 5/5 proximal to distal RLE: HF 3/5, distally in boot, able to wiggle toes--remains  stable, limited by pain--stable LLE: 3+/5 proximal to distal--stable Sensation intact to light touch in all 4's Skin: Sacral wound dressed    Psychiatric: Pleasant and cooperative     Assessment/Plan: 1. 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 Physiatrist is providing close team supervision and 24 hour management of active medical problems listed below. Physiatrist and rehab team continue to assess barriers to discharge/monitor patient progress toward functional and medical goals.  Function:  Bathing Bathing position   Position: Sitting EOB  Bathing parts Body parts bathed by patient: Chest, Abdomen, Front perineal area, Left upper leg, Left lower leg, Right upper leg, Buttocks Body parts bathed by helper: Buttocks, Left arm, Left lower leg, Back  Bathing assist Assist Level: (mod A)      Upper Body Dressing/Undressing Upper body dressing   What is the patient wearing?: Hospital gown     Pull over shirt/dress - Perfomed by patient: Thread/unthread left sleeve, Put head through opening, Pull shirt over trunk Pull over shirt/dress - Perfomed by helper: Thread/unthread right sleeve        Upper body assist Assist Level: Set up, Supervision or verbal cues   Set up : To obtain clothing/put away  Lower Body Dressing/Undressing Lower body dressing   What is the patient wearing?: Non-skid slipper socks     Pants- Performed by patient: Thread/unthread  left pants leg Pants- Performed by helper: Thread/unthread right pants leg, Pull pants up/down Non-skid slipper socks- Performed by patient: Don/doff left sock                    Lower body assist Assist for lower body dressing: (max A)      Toileting Toileting Toileting activity did not occur: Safety/medical concerns Toileting steps completed by patient: Adjust clothing prior to toileting,  Performs perineal hygiene, Adjust clothing after toileting Toileting steps completed by helper: Adjust clothing prior to toileting, Performs perineal hygiene, Adjust clothing after toileting    Toileting assist Assist level: Touching or steadying assistance (Pt.75%)   Transfers Chair/bed transfer Chair/bed transfer activity did not occur: N/A Chair/bed transfer method: Stand pivot Chair/bed transfer assist level: Touching or steadying assistance (Pt > 75%) Chair/bed transfer assistive device: Armrests, Patent attorneyWalker     Locomotion Ambulation Ambulation activity did not occur: N/A   Max distance: 30 ft Assist level: Touching or steadying assistance (Pt > 75%)   Wheelchair Wheelchair activity did not occur: N/A Type: Manual Max wheelchair distance: 75 ft Assist Level: Supervision or verbal cues  Cognition Comprehension Comprehension assist level: Understands basic 90% of the time/cues < 10% of the time  Expression Expression assist level: Expresses basic 90% of the time/requires cueing < 10% of the time.  Social Interaction Social Interaction assist level: Interacts appropriately with others with medication or extra time (anti-anxiety, antidepressant).  Problem Solving Problem solving assist level: Solves complex problems: With extra time  Memory Memory assist level: More than reasonable amount of time   Medical Problem List and Plan: 1.  Cognitive deficits with delayed processing/sequencing, weight bearing restrictions on right as well as left sided weakness secondary to TBI/polytrauma.    -Continue PT OT and speech therapies.     -Making ongoing improvements 2.  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     -continue therapeutic dose lovenox, no breathing difficulties    -up as tolerated 3. Pain Management: Hydrocodone prn, pain control appears adequate   -Heat and conservative management for right pectoralis strain 4. Mood: LCSW to follow for evaluation and support.  5. Neuropsych: This patient is  not capable of making decisions on his  own behalf. 6. Skin/Wound Care: routine pressure relief measures.  Appreciate wound care nurse assistance with sacral wound  7. Fluids/Electrolytes/Nutrition: Monitor I/O.   Good p.o. intake at present 8. HTN: Continue metoprolol, lisinopril and HCTZ.  Continue to monitor bid and titrate medications as indicated.  Blood pressure controlled 02/09/2017 Vitals:   02/09/17 0529 02/09/17 0810  BP: 111/65 129/68  Pulse: 64 77  Resp: 18   Temp: 97.7 F (36.5 C)   SpO2: 99%    9. ABLA: hgb 9.7 today 10. T2DM: Hgb A1c- 8.5. CBG (last 3)  Recent Labs    02/08/17 1643 02/08/17 2110 02/09/17 0636  GLUCAP 113* 84 152*  Controlled 12/9   -sugars under tight control off insulinncreased Glucotrol to 10 mg twice daily.     - cm medium diet 11.Urinary retention: continue urecholine and Flomax for now.   Pt seems to be voiding.  12. Right open segmental tibial fracture s/p ORIF: NWB RLE with CAM boot on at all times---follow up X rays stable/improving.  13. Right distal radius fracture s/p ORIF: Ok to weight bear on right elbow-- follow up X rays stable/improving. Follow up with Dr. Kerry FortHaddick in 4 weeks.  14. Multifocal non-displaced C2 fracture: Collar on at all times--soft collar when in  bed and hard collar before getting out of bed.   15.  Constipation: Laxative plus softener ordered.  Sorbitol today  LOS (Days) 11 A FACE TO FACE EVALUATION WAS PERFORMED  Erick ColaceAndrew E Kirsteins, MD 02/09/2017 10:32 AM

## 2017-02-10 ENCOUNTER — Inpatient Hospital Stay (HOSPITAL_COMMUNITY): Payer: Self-pay | Admitting: Occupational Therapy

## 2017-02-10 ENCOUNTER — Inpatient Hospital Stay (HOSPITAL_COMMUNITY): Payer: Self-pay

## 2017-02-10 LAB — GLUCOSE, CAPILLARY
GLUCOSE-CAPILLARY: 129 mg/dL — AB (ref 65–99)
GLUCOSE-CAPILLARY: 81 mg/dL (ref 65–99)
Glucose-Capillary: 188 mg/dL — ABNORMAL HIGH (ref 65–99)
Glucose-Capillary: 83 mg/dL (ref 65–99)

## 2017-02-10 NOTE — Progress Notes (Signed)
PHYSICAL MEDICINE & REHABILITATION     PROGRESS NOTE    Subjective/Complaints: Up with OT. No new issues. Pain controlled. Asking about his wb precautions again  ROS: pt denies nausea, vomiting, diarrhea, cough, shortness of breath or chest pain   Objective: Vital Signs: Blood pressure 122/62, pulse 68, temperature 98.2 F (36.8 C), temperature source Oral, resp. rate 18, height 5\' 11"  (1.803 m), weight 98.8 kg (217 lb 13 oz), SpO2 99 %. No results found. No results for input(s): WBC, HGB, HCT, PLT in the last 72 hours. No results for input(s): NA, K, CL, GLUCOSE, BUN, CREATININE, CALCIUM in the last 72 hours.  Invalid input(s): CO CBG (last 3)  Recent Labs    02/09/17 1628 02/09/17 2142 02/10/17 0739  GLUCAP 90 150* 129*    Wt Readings from Last 3 Encounters:  02/03/17 98.8 kg (217 lb 13 oz)  01/24/17 95.4 kg (210 lb 5.1 oz)  11/29/15 99.8 kg (220 lb)    Physical Exam:  Constitutional: He is oriented to person, place, and time. He appears well-developed and well-nourished. No distress.  HENT:  Head: Normocephalic and atraumatic.  Mouth/Throat: oral mucosa moist   scar mid forehead healed  Eyes: Conjunctivae are normal. Pupils are equal, round, and reactive to light.    Neck:  Soft collar in place.   Cardiovascular:RRR without murmur. No JVD       Respiratory: Normal effort GI: Soft. Bowel sounds are normal. He exhibits no distension. There is no tenderness.  Musculoskeletal: He exhibits trace edema  .  Right forearm in splint and fitting more appropriately.  CAM in place.  Neurological: He is alert and oriented to person, place, and time.  Mild dysarthria but completely intelligible  Oriented to place, reason he's here.  Improved insight and awareness.  Still some short-term memory issues Motor: RUE: 3/5 Delt , biceps hand grip 4/5 LUE: 5/5 proximal to distal RLE: HF 3/5, distally in boot, able to wiggle toes--remains stable, limited by  pain--stable LLE: 3+/5 proximal to distal--stable Sensation intact to light touch in all 4's Skin: Sacral wound dressed    Psychiatric: Pleasant and cooperative     Assessment/Plan: 1. 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 Physiatrist is providing close team supervision and 24 hour management of active medical problems listed below. Physiatrist and rehab team continue to assess barriers to discharge/monitor patient progress toward functional and medical goals.  Function:  Bathing Bathing position   Position: Sitting EOB  Bathing parts Body parts bathed by patient: Chest, Abdomen, Front perineal area, Left upper leg, Left lower leg, Right upper leg, Buttocks Body parts bathed by helper: Buttocks, Left arm, Left lower leg, Back  Bathing assist Assist Level: (mod A)      Upper Body Dressing/Undressing Upper body dressing   What is the patient wearing?: Hospital gown     Pull over shirt/dress - Perfomed by patient: Thread/unthread left sleeve, Put head through opening, Pull shirt over trunk Pull over shirt/dress - Perfomed by helper: Thread/unthread right sleeve        Upper body assist Assist Level: Set up, Supervision or verbal cues   Set up : To obtain clothing/put away  Lower Body Dressing/Undressing Lower body dressing   What is the patient wearing?: Non-skid slipper socks     Pants- Performed by patient: Thread/unthread left pants leg Pants- Performed by helper: Thread/unthread right pants  leg, Pull pants up/down Non-skid slipper socks- Performed by patient: Don/doff left sock                    Lower body assist Assist for lower body dressing: (max A)      Toileting Toileting Toileting activity did not occur: Safety/medical concerns Toileting steps completed by patient: Adjust clothing prior to toileting, Performs perineal  hygiene, Adjust clothing after toileting Toileting steps completed by helper: Adjust clothing prior to toileting, Performs perineal hygiene, Adjust clothing after toileting    Toileting assist Assist level: Touching or steadying assistance (Pt.75%)   Transfers Chair/bed transfer Chair/bed transfer activity did not occur: N/A Chair/bed transfer method: Stand pivot Chair/bed transfer assist level: Touching or steadying assistance (Pt > 75%) Chair/bed transfer assistive device: Armrests, Patent attorney Ambulation activity did not occur: N/A   Max distance: 30 ft Assist level: Touching or steadying assistance (Pt > 75%)   Wheelchair Wheelchair activity did not occur: N/A Type: Manual Max wheelchair distance: 75 ft Assist Level: Supervision or verbal cues  Cognition Comprehension Comprehension assist level: Understands basic 90% of the time/cues < 10% of the time  Expression Expression assist level: Expresses basic 90% of the time/requires cueing < 10% of the time.  Social Interaction Social Interaction assist level: Interacts appropriately with others with medication or extra time (anti-anxiety, antidepressant).  Problem Solving Problem solving assist level: Solves complex problems: With extra time  Memory Memory assist level: More than reasonable amount of time   Medical Problem List and Plan: 1.  Cognitive deficits with delayed processing/sequencing, weight bearing restrictions on right as well as left sided weakness secondary to TBI/polytrauma.    -Continue PT OT and speech therapies.     -make a sign re: wb precautions.  2.  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     -continue therapeutic dose lovenox, no breathing difficulties    -up as tolerated 3. Pain Management: Hydrocodone prn, pain control appears adequate   -Heat and conservative management for right pectoralis strain 4. Mood: LCSW to follow for evaluation and support.  5. Neuropsych: This patient is not capable of  making decisions on his  own behalf. 6. Skin/Wound Care: routine pressure relief measures.  Appreciate wound care nurse assistance with sacral wound  7. Fluids/Electrolytes/Nutrition: Monitor I/O.   Good p.o. intake at present 8. HTN: Continue metoprolol, lisinopril and HCTZ.  Continue to monitor bid and titrate medications as indicated.  Blood pressure controlled 02/09/2017 Vitals:   02/10/17 0601 02/10/17 0833  BP: 109/73 122/62  Pulse: 72 68  Resp: 18   Temp: 98.2 F (36.8 C)   SpO2: 99%    9. ABLA: hgb 9.7 today 10. T2DM: Hgb A1c- 8.5. CBG (last 3)  Recent Labs    02/09/17 1628 02/09/17 2142 02/10/17 0739  GLUCAP 90 150* 129*  Controlled 12/9   -sugars under tight control off insulin     -inreased Glucotrol to 10 mg twice daily with good results    -cm medium diet             11.Urinary retention: continue urecholine and Flomax for now.   Pt seems to be voiding.  12. Right open segmental tibial fracture s/p ORIF: NWB RLE with CAM boot on at all times---follow up X rays stable/improving.  13. Right distal radius fracture s/p ORIF: Ok to weight bear on right elbow-- follow up X rays stable/improving. Follow up with Dr. Kerry Fort in 4 weeks.  14.  Multifocal non-displaced C2 fracture: Collar on at all times--soft collar when in bed and hard collar before getting out of bed.   15.  Constipation: Laxative plus softener ordered.  Sorbitol today  LOS (Days) 12 A FACE TO FACE EVALUATION WAS PERFORMED  Ranelle OysterSWARTZ,ZACHARY T, MD 02/10/2017 10:41 AM

## 2017-02-10 NOTE — Progress Notes (Addendum)
Physical Therapy Note  Patient Details  Name: Mark LoseVernell Bullis Jr. MRN: [id number] Date of Birth: 07/08/1980 Today's Date: 02/10/2017   Hard collar when OOB, CAM boot RLe at all times, NWBing RUE and RLE except R elbow during transfers or gait tx 1:  0930-1030, 60 min individual tx Pain: none per pt  Pt able to state wt bearing precautions and use of soft vs hard collar.   Pt in bed.  PT doffed soft collar and donned hard collar with pt in bed.  Pt sat up EOB x 3 min with mild dizziness which resolved. PT donned 2nd gown.  Sti> stand from raised bed to RPFRW, min assist to trans- fer to w/c.  PT taped Dycem to L sock for improved traction for w/c propulsion. W/c propulsion using hemi method on level tile and through cone obstacle course with supervision, cues for use of L foot for better steering.  Up 3% grade with min assist, down 3% grade with cues.  R hand activity on table top, turning over small flat figures.  Use of R shoulder rotation to compensate for reduced supination/pronation.  neuromuscular re-education via multimodal cues for 10 x 1 R long arc quad knee ext, with boot on LL, focusing on eccentric control.  During seated rest break, pt able to count backwards from 60-30 by 2s, and forward 12-36 by 3s, 100% accurately.  Gait with RPFRW x 32' with 1 standing rest break, on level tile, min guard assist, cues for safer speed, breathing throughout effort.  Pt left resting in w/c with all needs within reach.  tx 2:  1430-1500, 30 min individual tx Pain: none per pt  Transfer training for squat pivot transfer bed> w/c on L, mod assist.   W/c propulsion as above, in home setting in congested area requiring backing up to fit between furniture pieces, propelling on carpet, opening and closing doors safely with L hand from w/c.  Pt left resting in bed with all needs within reach. PT reiterated falls precautions with pt.  See function navigator for current  status.   [name] 02/10/2017, 7:46 AM

## 2017-02-10 NOTE — Progress Notes (Signed)
Occupational Therapy Note  Patient Details  Name: Mark Mcguire. MRN: [id number] Date of Birth: 03-16-1980  Today's Date: 02/10/2017 OT Individual Time: 1300-1330 OT Individual Time Calculation (min): 30 min   No c/o pain Pt received in bed. Pt worked on AROM exercises for his R shoulder with guided Assist to steady his arm.  Pt's splint doffed and his arm washed to remove dead skin, new arm sleeve provided to absorb moisture.  Pt's splint is pressing slightly on ulnar side of arm and may need to be adjusted. Pt stated that he does not feel any pressure. Splint donned and pt set up with all needs met.     [place] 02/10/2017, 3:24 PM

## 2017-02-11 ENCOUNTER — Inpatient Hospital Stay (HOSPITAL_COMMUNITY): Payer: No Typology Code available for payment source | Admitting: Occupational Therapy

## 2017-02-11 ENCOUNTER — Inpatient Hospital Stay (HOSPITAL_COMMUNITY): Payer: Self-pay | Admitting: Physical Therapy

## 2017-02-11 ENCOUNTER — Inpatient Hospital Stay (HOSPITAL_COMMUNITY): Payer: Self-pay

## 2017-02-11 ENCOUNTER — Other Ambulatory Visit: Payer: Self-pay

## 2017-02-11 LAB — GLUCOSE, CAPILLARY
GLUCOSE-CAPILLARY: 82 mg/dL (ref 65–99)
Glucose-Capillary: 122 mg/dL — ABNORMAL HIGH (ref 65–99)
Glucose-Capillary: 124 mg/dL — ABNORMAL HIGH (ref 65–99)
Glucose-Capillary: 108 mg/dL — ABNORMAL HIGH (ref 65–99)
Glucose-Capillary: 62 mg/dL — ABNORMAL LOW (ref 65–99)
Glucose-Capillary: 69 mg/dL (ref 65–99)

## 2017-02-11 NOTE — Progress Notes (Signed)
Occupational Therapy Session Note  Patient Details  Name: Mark LoseVernell Blades Jr. MRN: [id number] Date of Birth: 11/10/1980  Today's Date: 02/11/2017 OT Individual Time: [id number] OT Individual Time Calculation (min): 58 min    Short Term Goals: Week 1:  OT Short Term Goal 1 (Week 1): Pt will be able to sit to EOB with min A [facility]. OT Short Term Goal 2 (Week 1): [facility]. OT Short Term Goal 3 (Week 1): Pt will be able to lean on R hip to be able to cleanse self in lateral lean on toilet. OT Short Term Goal 4 (Week 1): Pt will be able to don a shirt with mod A.  Skilled Therapeutic Interventions/Progress Updates:    Upon entering the room, pt seated in wheelchair awaiting therapist arrival. Pt with no c/o pain and agreeable to OT intervention. Pt performed grooming at sink with set up A to obtain items and open containers. Pt requesting to wash at sink with min A needed. Pt returning to bed at end of session secondary to fatigue and request for R UE hygiene. Pt transferred with use of platform RW and min stand pivot transfer while maintaining NWB status on R LE. Pt performed sit >supine with supervision. OT removed splint and checked for pressure areas with none noted. OT changed sleeve, washed splint, and R hand and forearm for hygiene. OT also opened R LE CAM boot and washed LE as able. Pt remained in bed at end of session and hard collar changed to soft collar for comfort. OT discussed upcoming discharge with pt and making preparation and plans. Call bell and all needed items within reach upon exiting the room.   Therapy Documentation Precautions:  Precautions Precautions: Cervical, Fall Required Braces or Orthoses: Sling, Cervical Brace, Other Brace/Splint Cervical Brace: At all times, Hard collar, Soft collar(Soft collar in bed, hard collar OOB) Other Brace/Splint: RLE CAM boot Restrictions Weight Bearing Restrictions: Yes RUE  Weight Bearing: Weight bear through elbow only RLE Weight Bearing: Non weight bearing Other Position/Activity Restrictions: may weight bear through R elbow General:   Vital Signs: Therapy Vitals Temp: 98.1 F (36.7 C) Temp Source: Oral Pulse Rate: 72 Resp: 18 BP: 121/68 Oxygen Therapy SpO2: 99 % O2 Device: Not Delivered Pain:   ADL: ADL ADL Comments: Refer to functional navigator Vision   Perception    Praxis   Exercises:   Other Treatments:    See Function Navigator for Current Functional Status.   Therapy/Group: Individual Therapy  Alen BleacherBradsher, Elizeo Rodriques P 02/11/2017, 5:09 PM

## 2017-02-11 NOTE — Progress Notes (Signed)
[place] PHYSICAL MEDICINE & REHABILITATION     PROGRESS NOTE    Subjective/Complaints: No new complaints.  Had a good night sleep.  He is comfortable.  He is emptying bowels and bladder  ROS: pt denies nausea, vomiting, diarrhea, cough, shortness of breath or chest pain   Objective: Vital Signs: Blood pressure 132/80, pulse 74, temperature 97.7 F (36.5 C), temperature source Oral, resp. rate 17, height 5\' 11"  (1.803 m), weight 98.8 kg (217 lb 13 oz), SpO2 100 %. No results found. No results for input(s): WBC, HGB, HCT, PLT in the last 72 hours. No results for input(s): NA, K, CL, GLUCOSE, BUN, CREATININE, CALCIUM in the last 72 hours.  Invalid input(s): CO CBG (last 3)  Recent Labs    02/10/17 1644 02/10/17 2117 02/11/17 0654  GLUCAP 83 81 122*    Wt Readings from Last 3 Encounters:  02/03/17 98.8 kg (217 lb 13 oz)  01/24/17 95.4 kg (210 lb 5.1 oz)  11/29/15 99.8 kg (220 lb)    Physical Exam:  Constitutional: He is oriented to person, place, and time. He appears well-developed and well-nourished. No distress.  HENT:  Head: Normocephalic and atraumatic.  Mouth/Throat: oral mucosa moist   scar mid forehead healed  Eyes: Conjunctivae are normal. Pupils are equal, round, and reactive to light.    Neck:  Soft collar in place.   Cardiovascular:RRR without murmur. No JVD        Respiratory: CTA Bilaterally without wheezes or rales. Normal effort  GI: Soft. Bowel sounds are normal. He exhibits no distension. There is no tenderness.  Musculoskeletal: He exhibits trace edema  .  Right forearm in splint and fitting more appropriately.  CAM in place.  Neurological: He is alert and oriented to person, place, and time.  Mild dysarthria but completely intelligible  Oriented to place, reason he's here.  Improved insight and awareness.  Still some short-term memory issues Motor: RUE: 3/5 Delt , biceps hand grip 4/5 LUE: 5/5 proximal to distal RLE: HF 3/5, distally in boot,  able to wiggle toes--remains stable, limited by pain--stable LLE: 3+/5 proximal to distal--stable Sensation intact to light touch in all 4's Skin: Sacral wound dressed    Psychiatric: Pleasant and cooperative     Assessment/Plan: 1. 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 Physiatrist is providing close team supervision and 24 hour management of active medical problems listed below. Physiatrist and rehab team continue to assess barriers to discharge/monitor patient progress toward functional and medical goals.  Function:  Bathing Bathing position   Position: Sitting EOB  Bathing parts Body parts bathed by patient: Chest, Abdomen, Front perineal area, Left upper leg, Left lower leg, Right upper leg, Buttocks Body parts bathed by helper: Buttocks, Left arm, Left lower leg, Back  Bathing assist Assist Level: (mod A)      Upper Body Dressing/Undressing Upper body dressing   What is the patient wearing?: Hospital gown     Pull over shirt/dress - Perfomed by patient: Thread/unthread left sleeve, Put head through opening, Pull shirt over trunk Pull over shirt/dress - Perfomed by helper: Thread/unthread right sleeve        Upper body assist Assist Level: Set up, Supervision or verbal cues   Set up : To obtain clothing/put away  Lower Body Dressing/Undressing Lower body dressing   What is the patient wearing?: Non-skid slipper socks     Pants-  Performed by patient: Thread/unthread left pants leg Pants- Performed by helper: Thread/unthread right pants leg, Pull pants up/down Non-skid slipper socks- Performed by patient: Don/doff left sock                    Lower body assist Assist for lower body dressing: (max A)      Toileting Toileting Toileting activity did not occur: Safety/medical concerns Toileting steps completed by patient: Adjust  clothing prior to toileting, Performs perineal hygiene, Adjust clothing after toileting Toileting steps completed by helper: Adjust clothing prior to toileting, Performs perineal hygiene, Adjust clothing after toileting    Toileting assist Assist level: Touching or steadying assistance (Pt.75%)   Transfers Chair/bed transfer Chair/bed transfer activity did not occur: N/A Chair/bed transfer method: Stand pivot Chair/bed transfer assist level: Touching or steadying assistance (Pt > 75%) Chair/bed transfer assistive device: Armrests, Patent attorneyWalker     Locomotion Ambulation Ambulation activity did not occur: N/A   Max distance: 32 Assist level: Touching or steadying assistance (Pt > 75%)   Wheelchair Wheelchair activity did not occur: N/A Type: Manual Max wheelchair distance: 150 Assist Level: Supervision or verbal cues  Cognition Comprehension Comprehension assist level: Understands basic 90% of the time/cues < 10% of the time  Expression Expression assist level: Expresses basic 90% of the time/requires cueing < 10% of the time.  Social Interaction Social Interaction assist level: Interacts appropriately with others with medication or extra time (anti-anxiety, antidepressant).  Problem Solving Problem solving assist level: Solves complex problems: With extra time  Memory Memory assist level: More than reasonable amount of time   Medical Problem List and Plan: 1.  Cognitive deficits with delayed processing/sequencing, weight bearing restrictions on right as well as left sided weakness secondary to TBI/polytrauma.    -Continue PT OT and speech therapies.     -make a sign re: wb precautions.  2.  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     -continue therapeutic dose lovenox, no breathing difficulties    -up as tolerated 3. Pain Management: Hydrocodone prn, pain control appears adequate   -heat/ROM prn 4. Mood: LCSW to follow for evaluation and support.  5. Neuropsych: This patient is not capable of  making decisions on his  own behalf. 6. Skin/Wound Care: routine pressure relief measures.  Appreciate wound care nurse assistance with sacral wound  7. Fluids/Electrolytes/Nutrition: Monitor I/O.   Good p.o. intake at present 8. HTN: Continue metoprolol, lisinopril and HCTZ.  Continue to monitor bid and titrate medications as indicated.  Blood pressure controlled 02/11/2017 Vitals:   02/11/17 0500 02/11/17 0833  BP: 117/70 132/80  Pulse: 66 74  Resp: 17   Temp: 97.7 F (36.5 C)   SpO2: 100%    9. ABLA: hgb 9.7 today 10. T2DM: Hgb A1c- 8.5. CBG (last 3)  Recent Labs    02/10/17 1644 02/10/17 2117 02/11/17 0654  GLUCAP 83 81 122*  Controlled 12/9   -sugars under tight control off insulin     -inreased Glucotrol to 10 mg twice daily with good results    -continue cm medium diet             11.Urinary retention:weaning off urecholine and Flomax for now.   Pt seems to be voiding.  12. Right open segmental tibial fracture s/p ORIF: NWB RLE with CAM boot on at all times---follow up X rays stable/improving.  13. Right distal radius fracture s/p ORIF: Ok to weight bear on right elbow-- follow up X rays stable/improving. Follow up with  Dr. Kerry FortHaddick in 4 weeks.  14. Multifocal non-displaced C2 fracture: Collar on at all times--soft collar when in bed and hard collar before getting out of bed.   15.  Constipation: Results with sorbitol  LOS (Days) 13 A FACE TO FACE EVALUATION WAS PERFORMED  Faith RogueSWARTZ,Mallie Linnemann T, MD 02/11/2017 9:08 AM
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
## 2017-02-11 NOTE — Progress Notes (Signed)
Physical Therapy Session Note  Patient Details  Name: Mark Mcguire. MRN: [id number] Date of Birth: May 17, 1980  Today's Date: 02/11/2017 PT Individual Time: 9499-7182 PT Individual Time Calculation (min): 55 min   Short Term Goals: Week 2:  PT Short Term Goal 1 (Week 2): Patient will transfer with S bed<> wheelchair PT Short Term Goal 2 (Week 2): Patient to ambulate with PFRW NWB R LE x 30' min-guard A PT Short Term Goal 3 (Week 2): Patient will tolerate standing activity for at least 15 minutes with 1-2 UE support and close S PT Short Term Goal 4 (Week 2): Patient will propel w/c 1' with S  Skilled Therapeutic Interventions/Progress Updates:   Pt supine upon arrival and agreeable to therapy, no c/o pain. Total assist to don hard collar. Worked on tolerance to OOB activity, endurance, and functional activity this session. Transferred to supine w/ supervision and to w/c via stand pivot w/ min guard using R platform walker. Pt self-propelled w/c to/from therapy gym w/ supervision using L hemi technique and practiced self-propelling w/c around tight turns and in home environment, verbal cues for environmental awareness. Ambulated 20' x2 using R platform walker w/ min guard. Prolonged seated rest break 2/2 fatigue w/o back support, worked on cognitive tasks in seated during rest breaks w/ minimal cues. Returned to room and ended session in supine, call bell within reach and all needs met. Provided w/ word search book and pencil 2/2 pt stating increased boredom and feeling "really down" w/o visitors. RN made aware.   Therapy Documentation Precautions:  Precautions Precautions: Cervical, Fall Required Braces or Orthoses: Sling, Cervical Brace, Other Brace/Splint Cervical Brace: At all times, Hard collar, Soft collar(Soft collar in bed, hard collar OOB) Other Brace/Splint: RLE CAM boot Restrictions Weight Bearing Restrictions: Yes RUE Weight Bearing: Weight bear through elbow only RLE  Weight Bearing: Non weight bearing Other Position/Activity Restrictions: may weight bear through R elbow  See Function Navigator for Current Functional Status.   Therapy/Group: Individual Therapy  [name] 02/11/2017, 4:27 PM

## 2017-02-11 NOTE — Progress Notes (Signed)
Physical Therapy Session Note  Patient Details  Name: Mark LoseVernell Serena Jr. MRN: [id number] Date of Birth: 02/08/1981  Today's Date: 02/11/2017 PT Individual Time: [id number] PT Individual Time Calculation (min): 75 min   Short Term Goals: Week 2:  PT Short Term Goal 1 (Week 2): Patient will transfer with S bed<> wheelchair PT Short Term Goal 2 (Week 2): Patient to ambulate with PFRW NWB R LE x 30' min-guard A PT Short Term Goal 3 (Week 2): Patient will tolerate standing activity for at least 15 minutes with 1-2 UE support and close S PT Short Term Goal 4 (Week 2): Patient will propel w/c 2850' with S  Skilled Therapeutic Interventions/Progress Updates:    Patient in bed, performed supine to sit with elevated HOB S.  Transfers min A with PFRW bed to w/c.  Propels w/c 150' L UE and LE to therapy gym.  Sit to stand x 5 with PFRW min A to close S.  Patient propelled w/c to dayroom and ambulated on carpeted surface x 35' with PFRW and minguard A maintaining NWB R LE.  Patient performed standing balance/endruace at checkerboard x about 10 minutes with one seated rest break.  Patient transferred to mat in gym with PFRW to perform LE therex including SAQ, hip adductor squeezes, bridging with knees on bolster all x 15 reps.  Supine to sit with S and cues.  Patient propelled to room with S and left in w/c all needs in reach.  Therapy Documentation Precautions:  Precautions Precautions: Cervical, Fall Required Braces or Orthoses: Sling, Cervical Brace, Other Brace/Splint Cervical Brace: At all times, Hard collar, Soft collar(Soft collar in bed, hard collar OOB) Other Brace/Splint: RLE CAM boot Restrictions Weight Bearing Restrictions: (P) Yes RUE Weight Bearing: (P) Weight bear through elbow only RLE Weight Bearing: (P) Non weight bearing Other Position/Activity Restrictions: may weight bear through R elbow Pain: Pain Assessment Pain Assessment: No/denies pain   See Function Navigator for Current  Functional Status.   Therapy/Group: Individual Therapy  Mark Mcguire 02/11/2017, 9:18 AM

## 2017-02-12 ENCOUNTER — Inpatient Hospital Stay (HOSPITAL_COMMUNITY): Payer: Self-pay

## 2017-02-12 ENCOUNTER — Inpatient Hospital Stay (HOSPITAL_COMMUNITY): Payer: No Typology Code available for payment source | Admitting: Occupational Therapy

## 2017-02-12 ENCOUNTER — Other Ambulatory Visit: Payer: Self-pay

## 2017-02-12 DIAGNOSIS — S12101S Unspecified nondisplaced fracture of second cervical vertebra, sequela: Secondary | ICD-10-CM

## 2017-02-12 DIAGNOSIS — S069X9S Unspecified intracranial injury with loss of consciousness of unspecified duration, sequela: Secondary | ICD-10-CM

## 2017-02-12 LAB — BASIC METABOLIC PANEL
Anion gap: 10 (ref 5–15)
BUN: 8 mg/dL (ref 6–20)
CALCIUM: 9.5 mg/dL (ref 8.9–10.3)
CHLORIDE: 102 mmol/L (ref 101–111)
CO2: 25 mmol/L (ref 22–32)
CREATININE: 0.59 mg/dL — AB (ref 0.61–1.24)
Glucose, Bld: 125 mg/dL — ABNORMAL HIGH (ref 65–99)
Potassium: 3.7 mmol/L (ref 3.5–5.1)
SODIUM: 137 mmol/L (ref 135–145)

## 2017-02-12 LAB — CBC
HCT: 32 % — ABNORMAL LOW (ref 39.0–52.0)
Hemoglobin: 10.1 g/dL — ABNORMAL LOW (ref 13.0–17.0)
MCH: 29.3 pg (ref 26.0–34.0)
MCHC: 31.6 g/dL (ref 30.0–36.0)
MCV: 92.8 fL (ref 78.0–100.0)
PLATELETS: 249 10*3/uL (ref 150–400)
RBC: 3.45 MIL/uL — AB (ref 4.22–5.81)
RDW: 15 % (ref 11.5–15.5)
WBC: 4.9 10*3/uL (ref 4.0–10.5)

## 2017-02-12 LAB — GLUCOSE, CAPILLARY
GLUCOSE-CAPILLARY: 161 mg/dL — AB (ref 65–99)
GLUCOSE-CAPILLARY: 132 mg/dL — AB (ref 65–99)
Glucose-Capillary: 105 mg/dL — ABNORMAL HIGH (ref 65–99)
Glucose-Capillary: 91 mg/dL (ref 65–99)

## 2017-02-12 MED ORDER — GLIPIZIDE 5 MG PO TABS
5.0000 mg | ORAL_TABLET | Freq: Every day | ORAL | Status: DC
  Administered 2017-02-12 – 2017-02-17 (×6): 5 mg via ORAL
  Filled 2017-02-12 (×6): qty 1

## 2017-02-12 MED ORDER — GLIPIZIDE 5 MG PO TABS
10.0000 mg | ORAL_TABLET | Freq: Every day | ORAL | Status: DC
  Administered 2017-02-13 – 2017-02-17 (×5): 10 mg via ORAL
  Filled 2017-02-12 (×5): qty 2

## 2017-02-12 NOTE — Consult Note (Signed)
WOC Nurse wound follow up Wound type:pressure injury sacrum stage II Measurement:3cm x 1cm x 0.1 Wound bed:100% pink Drainage (amount, consistency, odor) Scant Periwound:white macerated  Dressing procedure/placement/frequency:I have provided nurses with orders for wet to dry dressing changes BID using gauze, no pink foam. Wound edges are moist and white and need more air. No drainage from wound, foam just holding moisture in. Pt states he is going home in next week. We will follow this patient and remain available to this patient, nursing, and the medical and surgical teams.  Please re-consult if we need to assist further in between weekly visits.   Barnett HatterMelinda Estell Dillinger, RN-C, WTA-C Wound Treatment Associate

## 2017-02-12 NOTE — Progress Notes (Signed)
[facility] PHYSICAL MEDICINE & REHABILITATION     PROGRESS NOTE    Subjective/Complaints: Up in bed.  No complaints.  Low CBG last night, really no associated symptoms  ROS: pt denies nausea, vomiting, diarrhea, cough, shortness of breath or chest pain    Objective: Vital Signs: Blood pressure (P) 129/73, pulse (P) 60, temperature 98.1 F (36.7 C), temperature source Oral, resp. rate 18, height 5\' 11"  (1.803 m), weight 98.8 kg (217 lb 13 oz), SpO2 (P) 100 %. No results found. Recent Labs    02/12/17 0601  WBC 4.9  HGB 10.1*  HCT 32.0*  PLT 249   Recent Labs    02/12/17 0601  NA 137  K 3.7  CL 102  GLUCOSE 125*  BUN 8  CREATININE 0.59*  CALCIUM 9.5   CBG (last 3)  Recent Labs    02/11/17 2159 02/11/17 2243 02/12/17 0653  GLUCAP 69 82 161*    Wt Readings from Last 3 Encounters:  02/03/17 98.8 kg (217 lb 13 oz)  01/24/17 95.4 kg (210 lb 5.1 oz)  11/29/15 99.8 kg (220 lb)    Physical Exam:  Constitutional: He is oriented to person, place, and time. He appears well-developed and well-nourished. No distress.  HENT:  Head: Normocephalic and atraumatic.  Mouth/Throat: oral mucosa moist   scar mid forehead healed  Eyes: Conjunctivae are normal. Pupils are equal, round, and reactive to light.    Neck:  Soft collar in place.   Cardiovascular:RRR without murmur. No JVD         Respiratory: CTA Bilaterally without wheezes or rales. Normal effort  GI: Soft. Bowel sounds are normal. He exhibits no distension. There is no tenderness.  Musculoskeletal: He exhibits trace edema  .  Right forearm in splint and fitting more appropriately.  CAM in place.  Neurological: He is alert and oriented to person, place, and time.    Speech is clear  improved insight and awareness although ongoing short-term memory deficits motor: RUE: 3/5 Delt , biceps hand grip 4/5 LUE: 5/5 proximal to distal RLE: HF 3/5, distally in boot, able to wiggle toes--remains stable, limited by  pain--stable LLE: 3+/5 proximal to distal--stable Sensation intact to light touch in all 4's Skin: Sacral wound dressed.  Did not examined today.  Skin underneath cam boot is healing in wounds are superficial at this point    Psychiatric: Pleasant and cooperative     Assessment/Plan: 1. 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 Physiatrist is providing close team supervision and 24 hour management of active medical problems listed below. Physiatrist and rehab team continue to assess barriers to discharge/monitor patient progress toward functional and medical goals.  Function:  Bathing Bathing position   Position: Wheelchair/chair at sink  Bathing parts Body parts bathed by patient: Chest, Abdomen, Front perineal area, Left upper leg, Left lower leg, Right upper leg, Buttocks Body parts bathed by helper: Left arm  Bathing assist Assist Level: Touching or steadying assistance(Pt > 75%)      Upper Body Dressing/Undressing Upper body dressing   What is the patient wearing?: Hospital gown     Pull over shirt/dress - Perfomed by patient: Thread/unthread left sleeve, Put head through opening, Pull shirt over trunk Pull over shirt/dress - Perfomed by helper: Thread/unthread right sleeve        Upper body assist Assist Level: Set up, Supervision or verbal cues  Set up : To obtain clothing/put away  Lower Body Dressing/Undressing Lower body dressing   What is the patient wearing?: Non-skid slipper socks     Pants- Performed by patient: Thread/unthread left pants leg Pants- Performed by helper: Thread/unthread right pants leg, Pull pants up/down Non-skid slipper socks- Performed by patient: Don/doff left sock                    Lower body assist Assist for lower body dressing: (max A)      Toileting Toileting Toileting activity did not  occur: Safety/medical concerns Toileting steps completed by patient: Adjust clothing prior to toileting, Performs perineal hygiene, Adjust clothing after toileting Toileting steps completed by helper: Adjust clothing prior to toileting, Performs perineal hygiene, Adjust clothing after toileting    Toileting assist Assist level: Touching or steadying assistance (Pt.75%)   Transfers Chair/bed transfer Chair/bed transfer activity did not occur: N/A Chair/bed transfer method: Stand pivot Chair/bed transfer assist level: Touching or steadying assistance (Pt > 75%) Chair/bed transfer assistive device: Armrests, Patent attorneyWalker     Locomotion Ambulation Ambulation activity did not occur: N/A   Max distance: 25' Assist level: Touching or steadying assistance (Pt > 75%)   Wheelchair Wheelchair activity did not occur: N/A Type: Manual Max wheelchair distance: 150 Assist Level: Supervision or verbal cues  Cognition Comprehension Comprehension assist level: Understands basic 90% of the time/cues < 10% of the time  Expression Expression assist level: Expresses basic 90% of the time/requires cueing < 10% of the time.  Social Interaction Social Interaction assist level: Interacts appropriately with others with medication or extra time (anti-anxiety, antidepressant).  Problem Solving Problem solving assist level: Solves complex problems: With extra time  Memory Memory assist level: More than reasonable amount of time   Medical Problem List and Plan: 1.  Cognitive deficits with delayed processing/sequencing, weight bearing restrictions on right as well as left sided weakness secondary to TBI/polytrauma.    -Continue PT OT and speech therapies.     -make a sign re: wb precautions.  2.  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     -continue therapeutic dose lovenox, no breathing difficulties    -up as tolerated 3. Pain Management: Hydrocodone prn, pain control appears adequate   -heat/ROM prn 4. Mood: LCSW to follow for  evaluation and support.  5. Neuropsych: This patient is not capable of making decisions on his  own behalf. 6. Skin/Wound Care: routine pressure relief measures.  Appreciate wound care nurse assistance with sacral wound  7. Fluids/Electrolytes/Nutrition: Monitor I/O.   Good p.o. intake at present 8. HTN: Continue metoprolol, lisinopril and HCTZ.  Continue to monitor bid and titrate medications as indicated.  Blood pressure controlled 02/11/2017 Vitals:   02/11/17 1640 02/11/17 2100  BP: 121/68 (P) 129/73  Pulse: 72 (P) 60  Resp: 18   Temp: 98.1 F (36.7 C)   SpO2: 99% (P) 100%   9. ABLA: hgb 9.7 today 10. T2DM: Hgb A1c- 8.5. CBG (last 3)  Recent Labs    02/11/17 2159 02/11/17 2243 02/12/17 0653  GLUCAP 69 82 161*  Controlled 12/9   -sugars under tight control off insulin     -Continue Glucotrol 10 mg in the morning but decrease suppertime dose to 5 mg    -continue cm medium diet             11.Urinary retention:weaning off urecholine and Flomax for now.   Pt seems to be voiding.  12. Right open segmental tibial fracture s/p ORIF:  NWB RLE with CAM boot on at all times---follow up X rays stable/improving.  Given length of stay will ask orthopedic surgery regarding the potential of getting his x-rays done prior to discharge. 13. Right distal radius fracture s/p ORIF: Ok to weight bear on right elbow-- follow up X rays stable/improving. Follow up with Dr. Kerry FortHaddick in 4 weeks.  14. Multifocal non-displaced C2 fracture: Collar on at all times--soft collar when in bed and hard collar before getting out of bed.    Reaching out to neurosurgery regarding cervical collar.  It has been on 6 weeks 15.  Constipation: Results with sorbitol  LOS (Days) 14 A FACE TO FACE EVALUATION WAS PERFORMED  Ranelle OysterSWARTZ,ZACHARY T, MD 02/12/2017 8:47 AM

## 2017-02-12 NOTE — Progress Notes (Signed)
Physical Therapy Note  Patient Details  Name: Nicholas LoseVernell Dubow Jr. MRN: [id number] Date of Birth: 04/10/1980 Today's Date: 02/12/2017  1015-1125, 70 min individual tx Pain:none per pt  PT doffed soft collar and donned hard collar with pt in supine.  Bed mobility using bed features with supervision.  Stand pivot transfer bed> w/c to L with min guard and LPFRW.    Therapeutic exercise performed with LLE to increase strength for functional mobility: seated in w/c, use of Kinetron with L foot, resisted by PT on other foot plate, x 20 cycles x 4.  Seated R long arc quad knee ext x 2 x 10. Wearing sock with Dycem on it, W/c propulsion on level tile and 3% grade wth supervision, min cues for better steering via L foot. Gait training with RPFRW x 45' on level tile with min guard assist.  Pt expressed anxiety regarding the next few months, getting back to work, being at home at d/c.  PT provided emotional support and issued pt a 2018 calendar and 2019 calendar and encouraged him to practice hand writing with R hand in order to enter appointments, etc on calendars.  PT also consulted Valentina GuLucy, CSW about Neuropsych seeing pt again before d/c.   Pt left resting in bed with all needs within reach.  See function navigator for current status.  Rayder Sullenger 02/12/2017, 10:13 AM

## 2017-02-12 NOTE — Progress Notes (Signed)
Occupational Therapy Weekly Progress Note  Patient Details  Name: Mark Saling Jr. MRN: [id number] Date of Birth: 03/19/1980  Beginning of progress report period: January 30, 2017 End of progress report period: February 12, 2017  Today's Date: 02/12/2017 OT Individual Time: 0700-0813 OT Individual Time Calculation (min): 73 min    Patient has met 4 of 4 short term goals.  Pt making steady progress toward OT goals this week. Pt performing functional transfer with min A and use of platform RW. Pt requiring encouragement for self care tasks, staying up in wheelchair, and dressing. Pt maintaining NWB status without cues to do so.   Patient continues to demonstrate the following deficits: muscle weakness, decreased cardiorespiratoy endurance, decreased problem solving and decreased safety awareness and decreased sitting balance, decreased standing balance and decreased balance strategies and therefore will continue to benefit from skilled OT intervention to enhance overall performance with BADL.  Patient progressing toward long term goals..  Continue plan of care.  OT Short Term Goals Week 1:  OT Short Term Goal 1 (Week 1): Pt will be able to sit to EOB with min A to prepare for BSC transfer. OT Short Term Goal 1 - Progress (Week 1): Met OT Short Term Goal 2 (Week 1): Pt will be able to transfer to BSC with max A of 1. OT Short Term Goal 2 - Progress (Week 1): Met OT Short Term Goal 3 (Week 1): Pt will be able to lean on R hip to be able to cleanse self in lateral lean on toilet. OT Short Term Goal 3 - Progress (Week 1): Met OT Short Term Goal 4 (Week 1): Pt will be able to don a shirt with mod A. OT Short Term Goal 4 - Progress (Week 1): Met Week 2:  OT Short Term Goal 1 (Week 2): STGs=LTGs secondary to upcoming discharge  Skilled Therapeutic Interventions/Progress Updates:    Upon entering the room, pt supine in bed with no c/o pain this session. OT changed soft collar to hard collar  and pt performed supine >sit with supervision. Pt attempting to utilize urinal while seated on EOB but unable. OT having discussion with pt in regards to no longer using bed pan and ambulating to bathroom with staff for BM. Per staff report he has been refusing and using bed pan. Pt became very tearful and stating, " I feel like all this stuff is really hard and I wonder if I will be able to do it at home." OT provided pt with therapeutic use of self and encouraged pt to make daily goal for himself. Pt transferred into wheelchair with min A and use of platform walker. Pt propelled wheelchair with hemiplegic technique with L UE and L LE this session. Pt propelled wheelchair with supervision on carpeted surface to sit at table for breakfast. Pt returning to room at end of session while propelling self to room in same manner as above. Pt remained in wheelchair with call bell and all needed items within reach upon exiting the room.   Therapy Documentation Precautions:  Precautions Precautions: Cervical, Fall Required Braces or Orthoses: Sling, Cervical Brace, Other Brace/Splint Cervical Brace: At all times, Hard collar, Soft collar(Soft collar in bed, hard collar OOB) Other Brace/Splint: RLE CAM boot Restrictions Weight Bearing Restrictions: Yes RUE Weight Bearing: Weight bear through elbow only RLE Weight Bearing: Non weight bearing Other Position/Activity Restrictions: may weight bear through R elbow General:   Vital Signs:   Pain:   ADL: ADL ADL Comments:   Refer to functional navigator Vision   Perception    Praxis   Exercises:   Other Treatments:    See Function Navigator for Current Functional Status.   Therapy/Group: Individual Therapy  Bradsher, Katie P 02/12/2017, 9:53 AM   
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
## 2017-02-12 NOTE — Progress Notes (Signed)
Informed on call [name], PA of bedtime CBG- 62,, asymptomatic,   provided po liquids and food, continue to monitor and assess
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
## 2017-02-13 ENCOUNTER — Inpatient Hospital Stay (HOSPITAL_COMMUNITY): Payer: No Typology Code available for payment source | Admitting: Occupational Therapy

## 2017-02-13 ENCOUNTER — Inpatient Hospital Stay (HOSPITAL_COMMUNITY): Payer: Self-pay

## 2017-02-13 LAB — GLUCOSE, CAPILLARY
GLUCOSE-CAPILLARY: 116 mg/dL — AB (ref 65–99)
Glucose-Capillary: 132 mg/dL — ABNORMAL HIGH (ref 65–99)
Glucose-Capillary: 63 mg/dL — ABNORMAL LOW (ref 65–99)
Glucose-Capillary: 82 mg/dL (ref 65–99)
Glucose-Capillary: 104 mg/dL — ABNORMAL HIGH (ref 65–99)

## 2017-02-13 MED ORDER — ACETAMINOPHEN 325 MG PO TABS: 325.0000 mg | ORAL_TABLET | ORAL | Status: DC | PRN

## 2017-02-13 MED ORDER — BLOOD GLUCOSE MONITOR KIT: PACK | 0 refills | Status: AC
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
## 2017-02-13 NOTE — Significant Event (Signed)
Hypoglycemic Event  CBG: 63  Treatment: 15 GM carbohydrate snack  Symptoms: None  Follow-up CBG: Time:1231 CBG Result:82  Possible Reasons for Event: Unknown  Comments/MD notified:P. Love, PA notified no new orders received    Mark Mcguire, Mark Mcguire

## 2017-02-13 NOTE — Progress Notes (Signed)
Orthopaedic Trauma Progress Note  No issues orthopaedic wise. Getting discharged Tuesday  O:  Vitals:   02/13/17 0457 02/13/17 0847  BP: 109/74 110/70  Pulse: 60 62  Resp: 16   Temp: 98.1 F (36.7 C)   SpO2: 100%   RLE: Boot in place, Incisions clean, dry and intact. Compartments soft and compressible. Warm and well perfused toes. Able to flex toes and minimally extend them  RUE: Splint taken down. Incisions clean dry and intact. Pin sites clean and dry. Motor and sensory intact  A/P: 36 year old male with right open segmental tibia fracture and right ankle fracture s/p IMN/ORIF and right distal radius fracture s/p ORIF  -NWB RLE/RUE -May be WBAT through elbow on RUE -Orthoplast splint to RUE -X-rays of right wrist and right leg today -Plan to follow up 2 weeks (12/27) likely advancement of weightbearing RUE/RLE -Call to make appointment  Roby LoftsKevin P. Haddix, MD [facility] [phone] (phone)

## 2017-02-13 NOTE — Progress Notes (Signed)
Occupational Therapy Session Note  Patient Details  Name: Nicholas LoseVernell Kissick Jr. MRN: [id number] Date of Birth: 05/27/1980  Today's Date: 02/13/2017 OT Individual Time: [id number] OT Individual Time Calculation (min): 72 min    Short Term Goals: Week 2:  OT Short Term Goal 1 (Week 2): STGs=LTGs secondary to upcoming discharge  Skilled Therapeutic Interventions/Progress Updates:    Upon entering the room, pt supine in bed with no c/o pain this session. Pt agreeable to OT intervention. OT and pt discussed home environment and set up for self care tasks. Pt verbalized he would most likely be bathing from EOB with use of basin. Pt declines shower equipment at this time. OT discussed various ways for pt to be successful with task while seated. Pt bathing self with lateral leans to wash buttocks and peri are with set up for bathing.Pt given LH reacher to address LB dressing and OT demonstrated use of AE for task. Pt returned demonstration with success. Pt required increased time and min verbal cues for proper technique. Pt returning to bed at end of session with call bell and all needed items within reach upon exiting the room.   Therapy Documentation Precautions:  Precautions Precautions: Cervical, Fall Required Braces or Orthoses: Sling, Cervical Brace, Other Brace/Splint Cervical Brace: At all times, Hard collar, Soft collar(Soft collar in bed, hard collar OOB) Other Brace/Splint: RLE CAM boot Restrictions Weight Bearing Restrictions: Yes RUE Weight Bearing: Weight bear through elbow only RLE Weight Bearing: Non weight bearing Other Position/Activity Restrictions: may weight bear through R elbow General:   Vital Signs: Therapy Vitals Pulse Rate: 62 BP: 110/70 Pain: Pain Assessment Pain Assessment: No/denies pain ADL: ADL ADL Comments: Refer to functional navigator Vision   Perception    Praxis   Exercises:   Other Treatments:    See Function Navigator for Current Functional  Status.   Therapy/Group: Individual Therapy  Alen BleacherBradsher, Ewan Grau P 02/13/2017, 11:21 AM

## 2017-02-13 NOTE — Progress Notes (Addendum)
Physical Therapy Weekly Progress Note  Patient Details  Name: Mark Mcguire. MRN: [id number] Date of Birth: 12/11/1980  Beginning of progress report period: February 06, 2017 End of progress report period: February 13, 2017  Today's Date: 02/13/2017 PT Individual Time:Session1: 0930-1100; Session2: 1600-1630 PT Individual Time Calculation (min): 90 min & 30 min  Patient has met 4 of 4 short term goals.  Patient able to achieve goals for w/c mobility with improved fit for foot propulsion able to go >300' with L arm and foot propulsion.  Patient able to ambulate with PFRW up to 50' with min A.  Stable in standing for up to 15 minutes with UE support and transfers with P:FRW with S.  Patient still unable to manage wheelchair parts due to R UE limitations and w/c set up.   Patient continues to demonstrate the following deficits muscle weakness, muscle joint tightness and Limited weight bearing and therefore will continue to benefit from skilled PT intervention to increase functional independence with mobility.  Patient progressing toward long term goals..  Continue plan of care.  PT Short Term Goals Week 2:  PT Short Term Goal 1 (Week 2): Patient will transfer with S bed<> wheelchair PT Short Term Goal 1 - Progress (Week 2): Met PT Short Term Goal 2 (Week 2): Patient to ambulate with PFRW NWB R LE x 30' min-guard A PT Short Term Goal 2 - Progress (Week 2): Met PT Short Term Goal 3 (Week 2): Patient will tolerate standing activity for at least 15 minutes with 1-2 UE support and close S PT Short Term Goal 3 - Progress (Week 2): Met PT Short Term Goal 4 (Week 2): Patient will propel w/c 35' with S PT Short Term Goal 4 - Progress (Week 2): Met Week 3:  PT Short Term Goal 1 (Week 3): STG=LTG due to ELOS  Skilled Therapeutic Interventions/Progress Updates:    Session1:  Patient in bed and supine to sit S.  Able to stand with S with PFRW and ambulated 50' with minguard to S assist.  In w/c  propulsion over level tile and in elevator x 500' to view Christmas tree at main entrance.  Cues for safe entry/exit of elevator.  Patient transferred to mat for LE therex as noted below. Supine to sit on mat S, transfer to w/c with walker and S.  Pushed to room and transferred to bed with S per patient request.  Left in supine with all needs in reach.   Session2:Patient in bed, S for supine to sit and transferred to w/c with PFRW and S.  Patient propelled w/c to dayroom and worked on standing endurance with walker performing pushpin art project with L UE keeping R foot NWB.  Patient required one seated rest.  W/C management back to room with S and left in w/c all needs in reach.     Therapy Documentation Precautions:  Precautions Precautions: Cervical, Fall Required Braces or Orthoses: Sling, Cervical Brace, Other Brace/Splint Cervical Brace: At all times, Hard collar, Soft collar(Soft collar in bed, hard collar OOB) Other Brace/Splint: RLE CAM boot Restrictions Weight Bearing Restrictions: Yes RUE Weight Bearing: Weight bear through elbow only RLE Weight Bearing: Non weight bearing Other Position/Activity Restrictions: may weight bear through R elbow Pain: Pain Assessment Pain Assessment: No/denies pain Exercises: General Exercises - Lower Extremity Long Arc Quad: Strengthening;Right;15 reps;Seated Hip ABduction/ADduction: Strengthening;Sidelying;10 reps(clamshell x 2 sets) Hip Flexion/Marching: Strengthening;15 reps;Seated Other Exercises Other Exercises: seated hip adduction squeezing ball x 5 sec hold  x 15 Other Exercises: bridging with legs on bolster x 15 w/ 5 sec hold Other Exercises: hooklying hip abduction with yellow t-band x 2 x 10.    See Function Navigator for Current Functional Status.  Therapy/Group: Individual Therapy  Reginia Naas 02/13/2017, 12:54 PM

## 2017-02-13 NOTE — Progress Notes (Signed)
Discussed patient with Dr. Bevely Palmeritty. He would prefer patient to use hard collar at all times as cognition has improved and collar needs to be on for 3 months. Patient to call office for follow up appointment. Will inform patient.

## 2017-02-13 NOTE — Significant Event (Signed)

## 2017-02-14 ENCOUNTER — Inpatient Hospital Stay (HOSPITAL_COMMUNITY): Payer: Self-pay | Admitting: Occupational Therapy

## 2017-02-14 ENCOUNTER — Inpatient Hospital Stay (HOSPITAL_COMMUNITY): Payer: Self-pay

## 2017-02-14 ENCOUNTER — Inpatient Hospital Stay (HOSPITAL_COMMUNITY): Payer: Self-pay | Admitting: Physical Therapy

## 2017-02-14 LAB — GLUCOSE, CAPILLARY
GLUCOSE-CAPILLARY: 164 mg/dL — AB (ref 65–99)
GLUCOSE-CAPILLARY: 98 mg/dL (ref 65–99)
GLUCOSE-CAPILLARY: 97 mg/dL (ref 65–99)
Glucose-Capillary: 72 mg/dL (ref 65–99)

## 2017-02-14 NOTE — Plan of Care (Signed)
LTG updated due to improved endurance with w/c mobility and improved independence with bed mobility.

## 2017-02-14 NOTE — Progress Notes (Signed)
Physical Therapy Session Note  Patient Details  Name: Nicholas LoseVernell Beam Jr. MRN: [id number] Date of Birth: 07/10/1980  Today's Date: 02/14/2017 PT Individual Time: 1045-1200 PT Individual Time Calculation (min): 75 min   Short Term Goals: Week 3:  PT Short Term Goal 1 (Week 3): STG=LTG due to ELOS  Skilled Therapeutic Interventions/Progress Updates:    Pt seated in bed, agreeable to participate in therapy session. Pt reports no pain this AM. Supine to sit with SBA. Sit to stand SBA to PFRW. Stand pivot transfer bed to w/c with PFRW and SBA to CGA. Manual w/c propulsion 2 x 200 ft with Supervision, yellow sock with red dycem on L LE for better grip on the floor. Propel up/down ramp with Min A for steering. Pt does well maneuvering tight spaces with w/c. Supine L LE therex x 10 reps for strengthening. Pt demos increased strength by ability to perform exercises AROM vs AAROM. Ambulation x 50 ft with PFRW and SBA to CGA. Side-steps with PFRW and CGA to simulate home environment, pt exhibits more difficulty stepping to L vs R. Pt left seated in bed at end of session, needs in reach.  Therapy Documentation Precautions:  Precautions Precautions: Cervical, Fall Required Braces or Orthoses: Sling, Cervical Brace, Other Brace/Splint Cervical Brace: At all times, Hard collar, Soft collar(Soft collar in bed, hard collar OOB) Other Brace/Splint: RLE CAM boot Restrictions Weight Bearing Restrictions: Yes RUE Weight Bearing: Weight bear through elbow only RLE Weight Bearing: Non weight bearing Other Position/Activity Restrictions: may weight bear through R elbow  See Function Navigator for Current Functional Status.   Therapy/Group: Individual Therapy  Peter Congoaylor Kayler Buckholtz, PT, DPT  02/14/2017, 12:15 PM

## 2017-02-14 NOTE — Progress Notes (Signed)
Social Work Patient ID: Mark LoseVernell Traum Jr., male   DOB: 04/09/1980, 36 y.o.   MRN: [id number]   Have reviewed team conference with pt and left messages for mother.  Pt still set for d/c on tues 12/18 and we have scheduled family ed to take place beginning at 8:15 on Monday. Still to order DME and HH follow up.  Pt pleased with progress.  Carole Doner, LCSW

## 2017-02-14 NOTE — Progress Notes (Signed)
Physical Therapy Session Note  Patient Details  Name: Mark LoseVernell Raether Jr. MRN: [id number] Date of Birth: 07/31/1980  Today's Date: 02/14/2017 PT Individual Time: 1500-1600 PT Individual Time Calculation (min): 60 min   Short Term Goals: Week 3:  PT Short Term Goal 1 (Week 3): STG=LTG due to ELOS  Skilled Therapeutic Interventions/Progress Updates:    Patient seen with focus of session on DME needs for home as had gotten doorway measurements from mother, for LE therex and gait endurance.  Patient transferred to mat with PFRW and S.  Sit to supine S and performed SLR, LAQ, SAQ, seated hip flexion, hooklying hip abduction with yellow t-band, adductor squeezes with ball between knees and bridging over bolster.  Patient ambulated 50; with PFRW and min A.  Patient propelled 150' room<.>gym with S and adjusted sock for traction on floor.  Patient left in supine all needs in reach.   Therapy Documentation Precautions:  Precautions Precautions: Cervical, Fall Required Braces or Orthoses: Cervical Brace, Other Brace/Splint Cervical Brace: At all times, Hard collar(hard collar in bed also) Other Brace/Splint: RLE CAM boot, R UE arm splint Restrictions Weight Bearing Restrictions: Yes RUE Weight Bearing: Weight bear through elbow only RLE Weight Bearing: Non weight bearing Other Position/Activity Restrictions: may weight bear through R elbow Pain: Pain Assessment Pain Assessment: No/denies pain   See Function Navigator for Current Functional Status.   Therapy/Group: Individual Therapy  Elray McgregorCynthia Wynn 02/14/2017, 6:39 PM

## 2017-02-14 NOTE — Progress Notes (Signed)
Occupational Therapy Session Note  Patient Details  Name: Mark LoseVernell Idler Jr. MRN: [id number] Date of Birth: 06/01/1980  Today's Date: 02/14/2017 OT Individual Time: [id number] OT Individual Time Calculation (min): 65 min   Skilled Therapeutic Interventions/Progress Updates:    Tx focus on adaptive bathing/dressing skills, Rt functional use/ROM, and balance.   Pt greeted supine in bed, reports being uncomfortable with Aspen collar on AAT now but is getting used to it. Pt eating breakfast EOB with cues to use R UE intermittently. Pt with weak tripod grasp, decreased UE strength and needed to rest forearm on table for bringing food to mouth without active assist. He interchanged Lt/Rt UE use due to fatigue with using Rt. Afterwards he completed bathing EOB with supervision for lateral leans EOB to complete perihygiene. We inspected Rt forearm once splint was removed, washed/dried skin. No signs of redness/pressure areas. He assisted with reapplying splint but required assist around pinning site. Engaged R UE in UB bathing tasks with pt exhibiting decreased ability to grasp wash cloth due to FM deficits. Encouraged him to continue incorporating R UE in gentle functional tasks to improve function and ROM. Washed R LE in long sitting for hygiene/skin integrity purposes. Removed soiled pink foam around tibia. Per RN consultation, kept pink foam removed. CAM boot refastened and pt opted to remain in semi-reclined position in bed. Pt was repositioned for comfort and left with all needs within reach.    Therapy Documentation Precautions:  Precautions Precautions: Cervical, Fall Required Braces or Orthoses: Sling, Cervical Brace, Other Brace/Splint Cervical Brace: At all times, Hard collar, Soft collar(Soft collar in bed, hard collar OOB) Other Brace/Splint: RLE CAM boot Restrictions Weight Bearing Restrictions: Yes RUE Weight Bearing: Weight bear through elbow only RLE Weight Bearing: Non weight  bearing Other Position/Activity Restrictions: may weight bear through R elbow Vital Signs: Therapy Vitals Pulse Rate: 68 BP: 131/75 Pain: Pain Assessment Pain Assessment: No/denies pain ADL: ADL ADL Comments: Refer to functional navigator :    See Function Navigator for Current Functional Status.   Therapy/Group: Individual Therapy  Shadai Mcclane A Maliik Karner 02/14/2017, 12:18 PM

## 2017-02-14 NOTE — Progress Notes (Signed)
PHYSICAL MEDICINE & REHABILITATION     PROGRESS NOTE    Subjective/Complaints: Sitting up in bed.  No new problems.  Appetite good.  Pain controlled  ROS: pt denies nausea, vomiting, diarrhea, cough, shortness of breath or chest pain    Objective: Vital Signs: Blood pressure 131/75, pulse 68, temperature 98.4 F (36.9 C), temperature source Oral, resp. rate 16, height 5\' 11"  (1.803 m), weight 98.8 kg (217 lb 13 oz), SpO2 100 %. No results found. Recent Labs    02/12/17 0601  WBC 4.9  HGB 10.1*  HCT 32.0*  PLT 249   Recent Labs    02/12/17 0601  NA 137  K 3.7  CL 102  GLUCOSE 125*  BUN 8  CREATININE 0.59*  CALCIUM 9.5   CBG (last 3)  Recent Labs    02/13/17 1654 02/13/17 2115 02/14/17 0704  GLUCAP 116* 104* 164*    Wt Readings from Last 3 Encounters:  02/03/17 98.8 kg (217 lb 13 oz)  01/24/17 95.4 kg (210 lb 5.1 oz)  11/29/15 99.8 kg (220 lb)    Physical Exam:  Constitutional: He is oriented to person, place, and time. He appears well-developed and well-nourished. No distress.  HENT:  Head: Normocephalic and atraumatic.  Mouth/Throat: oral mucosa moist   scar mid forehead healed  Eyes: Conjunctivae are normal. Pupils are equal, round, and reactive to light.    Neck:  Soft collar in place.   Cardiovascular: RRR without murmur. No JVD          Respiratory: CTA Bilaterally without wheezes or rales. Normal effort   GI: Soft. Bowel sounds are normal. He exhibits no distension. There is no tenderness.  Musculoskeletal: He exhibits trace edema  .  Right forearm in splint and fitting more appropriately.  CAM boot in place.  Neurological: He is alert and oriented to person, place, and time.    Speech is clear  improved insight and awareness although ongoing short-term memory deficits motor: RUE: 3/5 Delt , biceps hand grip 4/5 LUE: 5/5 proximal to distal RLE: HF 3/5, distally in boot, able to wiggle toes--remains stable, limited by  pain--stable LLE: 3+/5 proximal to distal--stable Sensation intact to light touch in all 4's Skin: Sacral wound only about 1 cm and very superficial  .  Skin underneath cam boot is healing in wounds are superficial at this point    Psychiatric: Pleasant and cooperative     Assessment/Plan: 1. 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 Physiatrist is providing close team supervision and 24 hour management of active medical problems listed below. Physiatrist and rehab team continue to assess barriers to discharge/monitor patient progress toward functional and medical goals.  Function:  Bathing Bathing position   Position: Wheelchair/chair at sink  Bathing parts Body parts bathed by patient: Chest, Abdomen, Front perineal area, Left upper leg, Left lower leg, Right upper leg, Buttocks Body parts bathed by helper: Left arm  Bathing assist Assist Level: Touching or steadying assistance(Pt > 75%)      Upper Body Dressing/Undressing Upper body dressing   What is the patient wearing?: Hospital gown     Pull over shirt/dress - Perfomed by patient: Thread/unthread left sleeve, Put head through opening, Pull shirt over trunk Pull over shirt/dress - Perfomed by helper: Thread/unthread right sleeve        Upper body assist Assist Level: Set up, Supervision or verbal  cues   Set up : To obtain clothing/put away  Lower Body Dressing/Undressing Lower body dressing   What is the patient wearing?: Pants     Pants- Performed by patient: Thread/unthread left pants leg, Thread/unthread right pants leg, Pull pants up/down Pants- Performed by helper: Thread/unthread right pants leg, Pull pants up/down Non-skid slipper socks- Performed by patient: Don/doff left sock                    Lower body assist Assist for lower body dressing: Set up, Supervision or  verbal cues, Assistive device, Touching or steadying assistance (Pt > 75%) Assistive Device Comment: Programmer, systemsreacher    Toileting Toileting Toileting activity did not occur: Safety/medical concerns Toileting steps completed by patient: Adjust clothing prior to toileting, Performs perineal hygiene, Adjust clothing after toileting Toileting steps completed by helper: Adjust clothing prior to toileting, Performs perineal hygiene, Adjust clothing after toileting    Toileting assist Assist level: Touching or steadying assistance (Pt.75%)   Transfers Chair/bed transfer Chair/bed transfer activity did not occur: N/A Chair/bed transfer method: Stand pivot Chair/bed transfer assist level: Touching or steadying assistance (Pt > 75%) Chair/bed transfer assistive device: Armrests, Walker, Orthosis(R cam boot)     Locomotion Ambulation Ambulation activity did not occur: N/A   Max distance: 50 Assist level: Touching or steadying assistance (Pt > 75%)   Wheelchair Wheelchair activity did not occur: N/A Type: Manual Max wheelchair distance: 300 Assist Level: Supervision or verbal cues  Cognition Comprehension Comprehension assist level: Understands basic 90% of the time/cues < 10% of the time  Expression Expression assist level: Expresses basic 90% of the time/requires cueing < 10% of the time.  Social Interaction Social Interaction assist level: Interacts appropriately with others with medication or extra time (anti-anxiety, antidepressant).  Problem Solving Problem solving assist level: Solves complex problems: With extra time  Memory Memory assist level: More than reasonable amount of time   Medical Problem List and Plan: 1.  Cognitive deficits with delayed processing/sequencing, weight bearing restrictions on right as well as left sided weakness secondary to TBI/polytrauma.    -Continue PT OT and speech therapies.     -Progressing towards goals.  2.  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     -continue  therapeutic dose lovenox, no breathing difficulties    -up as tolerated 3. Pain Management: Hydrocodone prn, pain control appears adequate   -heat/ROM prn 4. Mood: LCSW to follow for evaluation and support.  5. Neuropsych: This patient is not capable of making decisions on his  own behalf. 6. Skin/Wound Care: Change sacral dressing to foam dressing  7. Fluids/Electrolytes/Nutrition: Monitor I/O.   Good p.o. intake at present 8. HTN: Continue metoprolol, lisinopril and HCTZ.  Continue to monitor bid and titrate medications as indicated.  Blood pressure controlled 02/11/2017 Vitals:   02/14/17 0500 02/14/17 0911  BP: 113/66 131/75  Pulse: (!) 57 68  Resp: 16   Temp: 98.4 F (36.9 C)   SpO2: 100%    9. ABLA: hgb 9.7 today 10. T2DM: Hgb A1c- 8.5. CBG (last 3)  Recent Labs    02/13/17 1654 02/13/17 2115 02/14/17 0704  GLUCAP 116* 104* 164*      -Generally reasonable control     -Glucotrol adjusted to 10 mg every morning and 5 mg every afternoon    -continue cm medium diet             11.Urinary retention:weaning off urecholine and Flomax for now.   Pt seems to be voiding.  12. Right open segmental tibial fracture s/p ORIF: NWB RLE with CAM boot on at all times---follow up X rays stable/improving.   See below  13. Right distal radius fracture s/p ORIF: Ok to weight bear on right elbow-- follow up X rays stable/improving. Follow up with Dr. Kerry FortHaddick in about 2 weeks where he will likely advance weightbearing on the right side.  14. Multifocal non-displaced C2 fracture: Aspen collar to remain on for 3 months from injury.  Follow-up with neurosurgery as an outpatient 15.  Constipation: Results with sorbitol  LOS (Days) 16 A FACE TO FACE EVALUATION WAS PERFORMED  Ranelle OysterSWARTZ,Nicolena Schurman T, MD 02/14/2017 9:24 AM

## 2017-02-15 ENCOUNTER — Inpatient Hospital Stay (HOSPITAL_COMMUNITY): Payer: Self-pay | Admitting: Occupational Therapy

## 2017-02-15 LAB — GLUCOSE, CAPILLARY
GLUCOSE-CAPILLARY: 79 mg/dL (ref 65–99)
GLUCOSE-CAPILLARY: 111 mg/dL — AB (ref 65–99)
Glucose-Capillary: 129 mg/dL — ABNORMAL HIGH (ref 65–99)
Glucose-Capillary: 93 mg/dL (ref 65–99)

## 2017-02-15 NOTE — Progress Notes (Signed)
Occupational Therapy Session Note  Patient Details  Name: Mark LoseVernell Cua Jr. MRN: [id number] Date of Birth: 09/03/1980  Today's Date: 02/15/2017 OT Individual Time: [id number] OT Individual Time Calculation (min): 61 min   Short Term Goals: Week 2:  OT Short Term Goal 1 (Week 2): STGs=LTGs secondary to upcoming discharge  Skilled Therapeutic Interventions/Progress Updates:    Tx focus on functional ambulation with device, Rt AROM/functional use, and psychosocial wellbeing.   Pt greeted supine in bed, requesting to work on handwriting. He ambulated with PFRW and supervision with w/c follow to dayroom. Pt using bilateral UEs for self propelling across carpet to table. Worked on [facility] with regular writing implements and lined paper. Pt initially with jagged penmanship, increasing smoothness with practice of writing his name. Cues provided for slowing down to improve legibility (pt using modified tripod grasp, per baseline). Also had him begin writing his CIR story (inspired by patient stories on wall). Used occupational narrative techniques to enhance therapeutic effects, discussing his overall OT progress and journey here. He requested to listen to music at this time to enhance affect. Additional [facility] remediation while working on word find puzzles (with combined scanning/cognitive demands). AAROM with R UE in shoulder flexion/abduction, and elbow flexion/extension. Pt exhibiting the most difficultly with shoulder flexion, tried to compensate by drawing arm outwards into scaption/abduction while lifting. Cued him to minimize shoulder hike, and assisted with guiding arm up into full range. Also discussed preparatory activities for Rt hand prior to [facility] engagement to optimize function. At end of tx pt self propelled back to room and transferred to bed via stand pivot. Pt left in supine with all needs within reach.    Therapy Documentation Precautions:  Precautions Precautions: Cervical, Fall Required  Braces or Orthoses: Cervical Brace, Other Brace/Splint Cervical Brace: At all times, Hard collar(hard collar in bed also) Other Brace/Splint: RLE CAM boot, R UE arm splint Restrictions Weight Bearing Restrictions: Yes RUE Weight Bearing: Weight bear through elbow only RLE Weight Bearing: Non weight bearing Other Position/Activity Restrictions: may weight bear through R elbow Pain: No c/o pain during tx    ADL: ADL ADL Comments: Refer to functional navigator     See Function Navigator for Current Functional Status.   Therapy/Group: Individual Therapy  Mark Mcguire 02/15/2017, 12:23 PM

## 2017-02-15 NOTE — Progress Notes (Signed)
[place] PHYSICAL MEDICINE & REHABILITATION     PROGRESS NOTE    Subjective/Complaints: No new complaints.  Had good night.  Pain remains controlled  ROS: pt denies nausea, vomiting, diarrhea, cough, shortness of breath or chest pain    Objective: Vital Signs: Blood pressure 119/70, pulse 66, temperature 98.1 F (36.7 C), temperature source Oral, resp. rate 16, height 5\' 11"  (1.803 m), weight 98.8 kg (217 lb 13 oz), SpO2 100 %. No results found. No results for input(s): WBC, HGB, HCT, PLT in the last 72 hours. No results for input(s): NA, K, CL, GLUCOSE, BUN, CREATININE, CALCIUM in the last 72 hours.  Invalid input(s): CO CBG (last 3)  Recent Labs    02/14/17 1656 02/14/17 2119 02/15/17 0656  GLUCAP 98 97 129*    Wt Readings from Last 3 Encounters:  02/03/17 98.8 kg (217 lb 13 oz)  01/24/17 95.4 kg (210 lb 5.1 oz)  11/29/15 99.8 kg (220 lb)    Physical Exam:  Constitutional: He is oriented to person, place, and time. He appears well-developed and well-nourished. No distress.  HENT:  Head: Normocephalic and atraumatic.  Mouth/Throat: oral mucosa moist   scar mid forehead healed  Eyes: Conjunctivae are normal. Pupils are equal, round, and reactive to light.    Neck:  Soft collar in place.   Cardiovascular: RRR without murmur. No JVD           Respiratory: CTA Bilaterally without wheezes or rales. Normal effort   GI: Soft. Bowel sounds are normal. He exhibits no distension. There is no tenderness.  Musculoskeletal: He exhibits trace edema  .  Right forearm in splint and fitting more appropriately.  CAM boot in place.  Neurological: He is alert and oriented to person, place, and time.    Speech is clear  improved insight and awareness although ongoing short-term memory deficits motor: RUE: 3/5 Delt , biceps hand grip 4/5 LUE: 5/5 proximal to distal RLE: HF 3/5, distally in boot, able to wiggle toes--remains stable, limited by pain--stable LLE: 3+/5 proximal to  distal--stable Sensation intact to light touch in all 4's Skin: Sacral wound only about 1 cm and very superficial  .  Skin underneath cam boot is healing in wounds are superficial at this point    Psychiatric: Pleasant and cooperative     Assessment/Plan: 1. 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 Physiatrist is providing close team supervision and 24 hour management of active medical problems listed below. Physiatrist and rehab team continue to assess barriers to discharge/monitor patient progress toward functional and medical goals.  Function:  Bathing Bathing position   Position: Sitting EOB  Bathing parts Body parts bathed by patient: Chest, Abdomen, Front perineal area, Left upper leg, Left lower leg, Right upper leg, Buttocks, Left arm, Right arm Body parts bathed by helper: Back, Right lower leg  Bathing assist Assist Level: Touching or steadying assistance(Pt > 75%)      Upper Body Dressing/Undressing Upper body dressing   What is the patient wearing?: Hospital gown     Pull over shirt/dress - Perfomed by patient: Thread/unthread left sleeve, Put head through opening, Pull shirt over trunk Pull over shirt/dress - Perfomed by helper: Thread/unthread right sleeve        Upper body assist Assist Level: Set up, Supervision or verbal cues   Set up : To obtain clothing/put away  Lower Body Dressing/Undressing Lower  body dressing   What is the patient wearing?: Non-skid slipper socks     Pants- Performed by patient: Thread/unthread left pants leg, Thread/unthread right pants leg, Pull pants up/down Pants- Performed by helper: Thread/unthread right pants leg, Pull pants up/down Non-skid slipper socks- Performed by patient: Don/doff left sock                    Lower body assist Assist for lower body dressing: Set up, Supervision or  verbal cues, Assistive device, Touching or steadying assistance (Pt > 75%) Assistive Device Comment: Programmer, systemsreacher    Toileting Toileting Toileting activity did not occur: Safety/medical concerns Toileting steps completed by patient: Adjust clothing prior to toileting, Performs perineal hygiene, Adjust clothing after toileting Toileting steps completed by helper: Adjust clothing prior to toileting, Performs perineal hygiene, Adjust clothing after toileting    Toileting assist Assist level: Touching or steadying assistance (Pt.75%)   Transfers Chair/bed transfer Chair/bed transfer activity did not occur: N/A Chair/bed transfer method: Stand pivot Chair/bed transfer assist level: Supervision or verbal cues Chair/bed transfer assistive device: Bedrails, Armrests, Orthosis     Locomotion Ambulation Ambulation activity did not occur: N/A   Max distance: 50 Assist level: Touching or steadying assistance (Pt > 75%)   Wheelchair Wheelchair activity did not occur: N/A Type: Manual Max wheelchair distance: 200 Assist Level: Supervision or verbal cues  Cognition Comprehension Comprehension assist level: Understands basic 90% of the time/cues < 10% of the time  Expression Expression assist level: Expresses basic 90% of the time/requires cueing < 10% of the time.  Social Interaction Social Interaction assist level: Interacts appropriately with others with medication or extra time (anti-anxiety, antidepressant).  Problem Solving Problem solving assist level: Solves complex problems: With extra time  Memory Memory assist level: More than reasonable amount of time   Medical Problem List and Plan: 1.  Cognitive deficits with delayed processing/sequencing, weight bearing restrictions on right as well as left sided weakness secondary to TBI/polytrauma.    -Continue PT OT and speech therapies.     -Progressing towards goals.  2.  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     -continue therapeutic dose lovenox, no  breathing difficulties    -up as tolerated 3. Pain Management: Hydrocodone prn, pain control appears adequate   -heat/ROM prn 4. Mood: LCSW to follow for evaluation and support.  5. Neuropsych: This patient is not capable of making decisions on his  own behalf. 6. Skin/Wound Care: Change sacral dressing to foam dressing  7. Fluids/Electrolytes/Nutrition: Monitor I/O.   Good p.o. intake at present 8. HTN: Continue metoprolol, lisinopril and HCTZ.  Continue to monitor bid and titrate medications as indicated.  Blood pressure controlled 02/11/2017 Vitals:   02/15/17 0600 02/15/17 0823  BP: (!) 95/56 119/70  Pulse: 64 66  Resp: 16   Temp: 98.1 F (36.7 C)   SpO2: 100%    9. ABLA: hgb 9.7 today 10. T2DM: Hgb A1c- 8.5. CBG (last 3)  Recent Labs    02/14/17 1656 02/14/17 2119 02/15/17 0656  GLUCAP 98 97 129*      -Generally reasonable control     -Glucotrol adjusted to 10 mg every morning and 5 mg every afternoon    -continue cm medium diet             11.Urinary retention:weaning off urecholine and Flomax for now.   Pt seems to be voiding.  12. Right open segmental tibial fracture s/p ORIF: NWB RLE with CAM boot on at all times---follow  up X rays stable/improving.   See below  13. Right distal radius fracture s/p ORIF: Ok to weight bear on right elbow-- follow up X rays stable/improving. Follow up with Dr. Kerry Fort in about 2 weeks where he will likely advance weightbearing on the right side.  14. Multifocal non-displaced C2 fracture: Aspen collar to remain on for 3 months from injury.  Follow-up with neurosurgery as an outpatient 15.  Constipation: Results with sorbitol  LOS (Days) 17 A FACE TO FACE EVALUATION WAS PERFORMED  Ranelle Oyster, MD 02/15/2017 9:23 AM

## 2017-02-16 LAB — GLUCOSE, CAPILLARY
GLUCOSE-CAPILLARY: 94 mg/dL (ref 65–99)
GLUCOSE-CAPILLARY: 97 mg/dL (ref 65–99)
GLUCOSE-CAPILLARY: 82 mg/dL (ref 65–99)
Glucose-Capillary: 111 mg/dL — ABNORMAL HIGH (ref 65–99)

## 2017-02-16 NOTE — Progress Notes (Signed)
[place] PHYSICAL MEDICINE & REHABILITATION     PROGRESS NOTE    Subjective/Complaints: No new complaints.  Feels well.  ROS: pt denies nausea, vomiting, diarrhea, cough, shortness of breath or chest pain   Objective: Vital Signs: Blood pressure 123/71, pulse 65, temperature 97.7 F (36.5 C), temperature source Oral, resp. rate 18, height 5\' 11"  (1.803 m), weight 98.8 kg (217 lb 13 oz), SpO2 100 %. No results found. No results for input(s): WBC, HGB, HCT, PLT in the last 72 hours. No results for input(s): NA, K, CL, GLUCOSE, BUN, CREATININE, CALCIUM in the last 72 hours.  Invalid input(s): CO CBG (last 3)  Recent Labs    02/15/17 1641 02/15/17 2136 02/16/17 0634  GLUCAP 111* 93 111*    Wt Readings from Last 3 Encounters:  02/03/17 98.8 kg (217 lb 13 oz)  01/24/17 95.4 kg (210 lb 5.1 oz)  11/29/15 99.8 kg (220 lb)    Physical Exam:  Constitutional: He is oriented to person, place, and time. He appears well-developed and well-nourished. No distress.  HENT:  Head: Normocephalic and atraumatic.  Mouth/Throat: oral mucosa moist   scar mid forehead healed  Eyes: Conjunctivae are normal. Pupils are equal, round, and reactive to light.    Neck:  Soft collar in place.   Cardiovascular: RRR without murmur. No JVD            Respiratory: CTA Bilaterally without wheezes or rales. Normal effort  GI: Soft. Bowel sounds are normal. He exhibits no distension. There is no tenderness.  Musculoskeletal: He exhibits trace edema  .  Right forearm in splint and fitting more appropriately.  CAM boot in place.  Neurological: He is alert and oriented to person, place, and time.    Speech is clear  improved insight and awareness although ongoing short-term memory deficits motor: RUE: 3/5 Delt , biceps hand grip 4/5 LUE: 5/5 proximal to distal RLE: HF 3/5, distally in boot, able to wiggle toes--remains stable, limited by pain--stable LLE: 3+/5 proximal to distal--stable Sensation  intact to light touch in all 4's Skin: Sacral wound only about 1 cm and very superficial  .  Skin underneath cam boot is healing in wounds are superficial at this point    Psychiatric: Pleasant and cooperative     Assessment/Plan: 1. 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 Physiatrist is providing close team supervision and 24 hour management of active medical problems listed below. Physiatrist and rehab team continue to assess barriers to discharge/monitor patient progress toward functional and medical goals.  Function:  Bathing Bathing position   Position: Sitting EOB  Bathing parts Body parts bathed by patient: Chest, Abdomen, Front perineal area, Left upper leg, Left lower leg, Right upper leg, Buttocks, Left arm, Right arm Body parts bathed by helper: Back, Right lower leg  Bathing assist Assist Level: Touching or steadying assistance(Pt > 75%)      Upper Body Dressing/Undressing Upper body dressing   What is the patient wearing?: Hospital gown     Pull over shirt/dress - Perfomed by patient: Thread/unthread left sleeve, Put head through opening, Pull shirt over trunk Pull over shirt/dress - Perfomed by helper: Thread/unthread right sleeve        Upper body assist Assist Level: Set up, Supervision or verbal cues   Set up : To obtain clothing/put away  Lower Body Dressing/Undressing Lower body dressing   What is  the patient wearing?: Non-skid slipper socks     Pants- Performed by patient: Thread/unthread left pants leg, Thread/unthread right pants leg, Pull pants up/down Pants- Performed by helper: Thread/unthread right pants leg, Pull pants up/down Non-skid slipper socks- Performed by patient: Don/doff left sock                    Lower body assist Assist for lower body dressing: Set up, Supervision or verbal cues, Assistive  device, Touching or steadying assistance (Pt > 75%) Assistive Device Comment: Programmer, systems activity did not occur: Safety/medical concerns Toileting steps completed by patient: Adjust clothing prior to toileting, Performs perineal hygiene, Adjust clothing after toileting Toileting steps completed by helper: Adjust clothing prior to toileting, Performs perineal hygiene, Adjust clothing after toileting    Toileting assist Assist level: Touching or steadying assistance (Pt.75%)   Transfers Chair/bed transfer Chair/bed transfer activity did not occur: N/A Chair/bed transfer method: Stand pivot Chair/bed transfer assist level: Supervision or verbal cues Chair/bed transfer assistive device: Bedrails, Armrests, Orthosis     Locomotion Ambulation Ambulation activity did not occur: N/A   Max distance: 50 Assist level: Touching or steadying assistance (Pt > 75%)   Wheelchair Wheelchair activity did not occur: N/A Type: Manual Max wheelchair distance: 200 Assist Level: Supervision or verbal cues  Cognition Comprehension Comprehension assist level: Understands basic 90% of the time/cues < 10% of the time  Expression Expression assist level: Expresses basic 90% of the time/requires cueing < 10% of the time.  Social Interaction Social Interaction assist level: Interacts appropriately with others with medication or extra time (anti-anxiety, antidepressant).  Problem Solving Problem solving assist level: Solves complex problems: With extra time  Memory Memory assist level: More than reasonable amount of time   Medical Problem List and Plan: 1.  Cognitive deficits with delayed processing/sequencing, weight bearing restrictions on right as well as left sided weakness secondary to TBI/polytrauma.    -Continue PT OT and speech therapies.     -Progressing towards goals.  2.  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     -continue therapeutic dose lovenox, no breathing difficulties     -up as tolerated 3. Pain Management: Hydrocodone prn, pain control appears adequate   -heat/ROM prn 4. Mood: LCSW to follow for evaluation and support.  5. Neuropsych: This patient is not capable of making decisions on his  own behalf. 6. Skin/Wound Care: Change sacral dressing to foam dressing  7. Fluids/Electrolytes/Nutrition: Monitor I/O.   Good p.o. intake at present 8. HTN: Continue metoprolol, lisinopril and HCTZ.  Continue to monitor bid and titrate medications as indicated.  Blood pressure controlled 02/11/2017 Vitals:   02/15/17 1435 02/16/17 0630  BP: 127/83 123/71  Pulse: 67 65  Resp: 17 18  Temp: 98.8 F (37.1 C) 97.7 F (36.5 C)  SpO2: 100% 100%   9. ABLA: hgb 9.7 today 10. T2DM: Hgb A1c- 8.5. CBG (last 3)  Recent Labs    02/15/17 1641 02/15/17 2136 02/16/17 0634  GLUCAP 111* 93 111*      -Generally reasonable control     -Glucotrol adjusted to 10 mg every morning and 5 mg every afternoon    -continue cm medium diet             11.Urinary retention:weaning off urecholine and Flomax for now.   Pt seems to be voiding.  12. Right open segmental tibial fracture s/p ORIF: NWB RLE with CAM boot on at all times---follow up X rays stable/improving.  See below  13. Right distal radius fracture s/p ORIF: Ok to weight bear on right elbow-- follow up X rays stable/improving. Follow up with Dr. Kerry FortHaddick in about 2 weeks where he will likely advance weightbearing on the right side.  14. Multifocal non-displaced C2 fracture: Aspen collar to remain on for 3 months from injury.  Follow-up with neurosurgery as an outpatient 15.  Constipation: Results with sorbitol  LOS (Days) 18 A FACE TO FACE EVALUATION WAS PERFORMED  Faith RogueSWARTZ,Lillien Petronio T, MD 02/16/2017 9:06 AM

## 2017-02-17 ENCOUNTER — Inpatient Hospital Stay (HOSPITAL_COMMUNITY): Payer: No Typology Code available for payment source | Admitting: Occupational Therapy

## 2017-02-17 ENCOUNTER — Ambulatory Visit (HOSPITAL_COMMUNITY): Payer: Self-pay

## 2017-02-17 LAB — GLUCOSE, CAPILLARY
GLUCOSE-CAPILLARY: 97 mg/dL (ref 65–99)
Glucose-Capillary: 125 mg/dL — ABNORMAL HIGH (ref 65–99)
Glucose-Capillary: 84 mg/dL (ref 65–99)
Glucose-Capillary: 96 mg/dL (ref 65–99)

## 2017-02-17 MED ORDER — GLIPIZIDE 5 MG PO TABS
5.0000 mg | ORAL_TABLET | Freq: Every day | ORAL | Status: DC
  Administered 2017-02-18: 5 mg via ORAL
  Filled 2017-02-17: qty 1
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
## 2017-02-17 NOTE — Progress Notes (Signed)
Physical Therapy Discharge Summary  Patient Details  Name: Mark Mcguire. MRN: [id number] Date of Birth: 1980/12/01  Today's Date: 02/17/2017 PT Individual Time: 0930-1030 PT Individual Time Calculation (min): 60 min    Patient has met 7 of 7 long term goals due to improved activity tolerance, improved balance, increased strength and ability to compensate for deficits.  Patient to discharge at a wheelchair level Supervision.   Patient's care partner is independent to provide the necessary physical assistance at discharge.  Reasons goals not met: Patient has achieved all goals and made excellent progress while in inpatient rehab.  Plans for continued HHPT at d/c with mother and cousin supporting pt in the home.   Recommendation:  Patient will benefit from ongoing skilled PT services in home health setting to continue to advance safe functional mobility, address ongoing impairments in strength, balance, safety in home environment, and minimize fall risk.  Equipment: wheelchair and platform RW  Reasons for discharge: discharge from hospital  Patient/family agrees with progress made and goals achieved: Yes  PT Discharge Precautions/Restrictions Precautions Precautions: Cervical;Fall Required Braces or Orthoses: Cervical Brace;Other Brace/Splint Cervical Brace: At all times;Hard collar(hard collar in bed also) Other Brace/Splint: RLE CAM boot, R UE arm splint Restrictions Weight Bearing Restrictions: Yes RUE Weight Bearing: Weight bear through elbow only RLE Weight Bearing: Non weight bearing Other Position/Activity Restrictions: may weight bear through R elbow Vital Signs Therapy Vitals Temp: 98.4 F (36.9 C) Temp Source: Oral Pulse Rate: 60 Resp: 17 BP: (!) 117/59 Patient Position (if appropriate): Lying Oxygen Therapy SpO2: 98 % O2 Device: Not Delivered Pain Pain Assessment Pain Assessment: No/denies pain Vision/Perception     Cognition Overall Cognitive  Status: Within Functional Limits for tasks assessed Arousal/Alertness: Awake/alert Orientation Level: Oriented X4 Sustained Attention: Appears intact Selective Attention: Appears intact Alternating Attention: Appears intact Divided Attention: Appears intact Memory: Appears intact Awareness: Appears intact Decision Making: Appears intact Self Correcting: Appears intact Safety/Judgment: Appears intact [employer] Scales of Cognitive Functioning: Purposeful/appropriate Sensation Sensation Light Touch: Appears Intact Hot/Cold: Appears Intact Proprioception: Appears Intact Coordination Gross Motor Movements are Fluid and Coordinated: Yes Coordination and Movement Description: limited with R UE due to orthopedic injury Motor  Motor Motor - Skilled Clinical Observations: Generalized weakness  Mobility Bed Mobility Rolling Right: 6: Modified independent (Device/Increase time) Rolling Left: 6: Modified independent (Device/Increase time) Supine to Sit: 6: Modified independent (Device/Increase time) Sit to Supine: 6: Modified independent (Device/Increase time) Transfers Transfers: Yes Sit to Stand: 5: Supervision Stand to Sit: 5: Supervision Stand Pivot Transfers: 5: Supervision Stand Pivot Transfer Details (indicate cue type and reason): with PFRW and following weight bearing precautions Locomotion  Ambulation Ambulation: Yes Ambulation/Gait Assistance: 5: Supervision Ambulation Distance (Feet): 50 Feet Assistive device: Right platform walker Gait Gait: Yes Gait Pattern: Impaired Gait Pattern: Shuffle;Decreased stride length Stairs / Additional Locomotion Stairs: No Wheelchair Mobility Wheelchair Mobility: Yes Wheelchair Assistance: 5: Supervision Wheelchair Propulsion: Left upper extremity;Left lower extremity Wheelchair Parts Management: Needs assistance Distance: 200  Trunk/Postural Assessment  Cervical Assessment Cervical Assessment: Exceptions to WFL([place]  collar AAT) Thoracic Assessment Thoracic Assessment: Within Functional Limits Lumbar Assessment Lumbar Assessment: Within Functional Limits Postural Control Postural Control: Within Functional Limits  Balance Balance Balance Assessed: Yes Dynamic Sitting Balance Dynamic Sitting - Level of Assistance: 5: Stand by assistance Static Standing Balance Static Standing - Level of Assistance: 5: Stand by assistance Extremity Assessment    LUE Assessment LUE Assessment: Within Functional Limits RLE Assessment RLE Assessment: Exceptions to [facility] hip  and knee WFL, ankle limited by boot, strength hip flexion 3+/5, knee extension 4/5) LLE Assessment LLE Assessment: Within Functional Limits   See Function Navigator for Current Functional Status.  Reginia Naas 02/17/2017, 3:34 PM

## 2017-02-17 NOTE — Progress Notes (Signed)
Physical Therapy Session Note  Patient Details  Name: Mark LoseVernell Fidalgo Jr. MRN: [id number] Date of Birth: 05/20/1980  Today's Date: 02/17/2017 PT Individual Time: 0930-1030 PT Individual Time Calculation (min): 60 min   Short Term Goals: Week 3:  PT Short Term Goal 1 (Week 3): STG=LTG due to ELOS  Skilled Therapeutic Interventions/Progress Updates:    Patient seen with mother present for caregiver education and for discharge assessement.  Patient on EOB and transferred to w/c educating mother on S level of assist.  Patient ambiulated 4150' with S with PFRW and mother assisting partway after educaiton and demonstration.  Also educated/demonstrated to mother w/c management for set up and for transport.  Pt. propelled in w/c to ortho gym and performed car transfer x 2 with S first PT then mother's assist.  Mother able to return demonstrate w/c management for transport as well.  Propelled to general gym and transferred to mat with mother assist and performed HEP including seated LAQ, supine hip adductor squeeze, hip abduction with yellow t-band, SLF, bridging over pillow all x 10 reps.  Patient sit<>supine mod I on mat no rails.  Patient transferred back to w/c with mother managing w/c and propelled to room.  Issued written HEP for home and discussed management of walker as well.  Left in w/c with mother present and needs in reach.  Therapy Documentation Precautions:  Precautions Precautions: Cervical, Fall Required Braces or Orthoses: Cervical Brace, Other Brace/Splint Cervical Brace: At all times, Hard collar(hard collar in bed also) Other Brace/Splint: RLE CAM boot, R UE arm splint Restrictions Weight Bearing Restrictions: Yes RUE Weight Bearing: Weight bearing as tolerated RLE Weight Bearing: Non weight bearing Other Position/Activity Restrictions: may weight bear through R elbow   Pain: Pain Assessment Pain Assessment: No/denies pain   See Function Navigator for Current Functional  Status.   Therapy/Group: Individual Therapy  Mark Mcguire 02/17/2017, 12:52 PM

## 2017-02-17 NOTE — Plan of Care (Signed)
LTG's achieved

## 2017-02-17 NOTE — Progress Notes (Signed)
Occupational Therapy Discharge Summary  Patient Details  Name: Mark Mcguire. MRN: [id number] Date of Birth: [birth year]  Today's Date: 02/17/2017 OT Individual Time: 0539-7673 and 4193-7902 OT Individual Time Calculation (min): 74 min and 58 min    Patient has met 8 of 8 long term goals due to improved activity tolerance, improved balance, postural control, ability to compensate for deficits, improved awareness and improved coordination.  Patient to discharge at overall mod I - min A level.  Patient's care partner is independent to provide the necessary physical and cognitive assistance at discharge.    Reasons goals not met: all goals met  Recommendation:  Patient will benefit from ongoing skilled OT services in home health setting to continue to advance functional skills in the area of BADL.  Equipment: 3 in 1 commode chair  Reasons for discharge: treatment goals met  Patient/family agrees with progress made and goals achieved: Yes   OT Intervention: Session 1: Upon entering the room, pt supine in room with mother present in room for family education. OT provided education on wearing and hygiene of R UE splint, Cam boot, and proper way to change pads of hard collar. Caregiver practicing multiple times until feeling comfortable with task. Pt performed supine >sit with mod I and donned UB shirt with set up A and LB with steady assistance and use of reacher with lateral leans to pull over B hips. Pt standing with supervision and ambulating to bathroom for toilet transfer at overall supervision level. Pt seated in wheelchair for grooming at sink. Pt returning to sitting on EOB to await next therapist session.   Session 2: Upon entering the room, pt supine in bed with mother present in room. Pt performed supine >sit with mod I and transferred from bed >wheelchair with use of platform RW at supervision level. Pt propelled wheelchair with L UE and LE 500' to main entrance with no rest break  needed until arriving at destination. OT continued to educate pt and caregiver on discharge recommendations. Caregiver asking questions as appropriate. Pt propelled wheelchair back to room in same manner. His mother assisted him with transfer from wheelchair >bed appropriately. Call bell and all needed items within reach upon exiting the room.   OT Discharge Precautions/Restrictions  Precautions Precautions: Cervical;Fall Required Braces or Orthoses: Cervical Brace;Other Brace/Splint Cervical Brace: At all times;Hard collar Other Brace/Splint: RLE CAM boot, R UE arm splint Restrictions Weight Bearing Restrictions: Yes RUE Weight Bearing: Weight bear through elbow only RLE Weight Bearing: Non weight bearing Other Position/Activity Restrictions: may weight bear through R elbow General   Vital Signs Therapy Vitals Temp: 98.4 F (36.9 C) Temp Source: Oral Pulse Rate: 60 Resp: 17 BP: (!) 117/59 Patient Position (if appropriate): Lying Oxygen Therapy SpO2: 98 % O2 Device: Not Delivered Pain Pain Assessment Pain Assessment: No/denies pain ADL ADL ADL Comments: Refer to functional navigator Vision Baseline Vision/History: No visual deficits Cognition Overall Cognitive Status: Within Functional Limits for tasks assessed Arousal/Alertness: Awake/alert Orientation Level: Oriented X4 Sustained Attention: Appears intact Selective Attention: Appears intact Alternating Attention: Appears intact Divided Attention: Appears intact Memory: Appears intact Awareness: Appears intact Decision Making: Appears intact Self Correcting: Appears intact Safety/Judgment: Appears intact [employer] Scales of Cognitive Functioning: Purposeful/appropriate Sensation Sensation Light Touch: Appears Intact Hot/Cold: Appears Intact Proprioception: Appears Intact Coordination Gross Motor Movements are Fluid and Coordinated: Yes Coordination and Movement Description: limited with R UE due to  orthopedic injury Motor  Motor Motor - Skilled Clinical Observations: Generalized weakness Motor -  Discharge Observations: Generalized weakness Mobility  Bed Mobility Rolling Right: 6: Modified independent (Device/Increase time) Rolling Left: 6: Modified independent (Device/Increase time) Supine to Sit: 6: Modified independent (Device/Increase time) Sit to Supine: 6: Modified independent (Device/Increase time) Transfers Sit to Stand: 5: Supervision Stand to Sit: 5: Supervision  Trunk/Postural Assessment  Cervical Assessment Cervical Assessment: Exceptions to WFL(aspen collar) Thoracic Assessment Thoracic Assessment: Within Functional Limits Lumbar Assessment Lumbar Assessment: Within Functional Limits Postural Control Postural Control: Within Functional Limits  Balance Balance Balance Assessed: Yes Dynamic Sitting Balance Dynamic Sitting - Level of Assistance: 5: Stand by assistance Static Standing Balance Static Standing - Level of Assistance: 5: Stand by assistance Extremity/Trunk Assessment RUE Assessment RUE Assessment: Exceptions to [facility] RUE Strength RUE Overall Strength Comments: more shoulder mobility since evaluation but continues to have splint on R UE. LUE Assessment LUE Assessment: Within Functional Limits   See Function Navigator for Current Functional Status.  Gypsy Decant 02/17/2017, 4:45 PM

## 2017-02-17 NOTE — Progress Notes (Signed)
[facility] PHYSICAL MEDICINE & REHABILITATION     PROGRESS NOTE    Subjective/Complaints: Had a good night.  Mother is here to go through some education today.  Excited about getting home.  ROS: pt denies nausea, vomiting, diarrhea, cough, shortness of breath or chest pain   Objective: Vital Signs: Blood pressure 138/83, pulse (!) 55, temperature 98.4 F (36.9 C), temperature source Oral, resp. rate 16, height 5\' 11"  (1.803 m), weight 98.8 kg (217 lb 13 oz), SpO2 100 %. No results found. No results for input(s): WBC, HGB, HCT, PLT in the last 72 hours. No results for input(s): NA, K, CL, GLUCOSE, BUN, CREATININE, CALCIUM in the last 72 hours.  Invalid input(s): CO CBG (last 3)  Recent Labs    02/16/17 1630 02/16/17 2206 02/17/17 0635  GLUCAP 97 82 97    Wt Readings from Last 3 Encounters:  02/03/17 98.8 kg (217 lb 13 oz)  01/24/17 95.4 kg (210 lb 5.1 oz)  11/29/15 99.8 kg (220 lb)    Physical Exam:  Constitutional: He is oriented to person, place, and time. He appears well-developed and well-nourished. No distress.  HENT:  Head: Normocephalic and atraumatic.  Mouth/Throat: oral mucosa moist   scar mid forehead healed  Eyes: Conjunctivae are normal. Pupils are equal, round, and reactive to light.    Neck:  Soft collar in place.   Cardiovascular: Bradycardia without JVD           Respiratory: CTA Bilaterally without wheezes or rales. Normal effort  GI: Soft. Bowel sounds are normal. He exhibits no distension. There is no tenderness.  Musculoskeletal: He exhibits trace edema  .  Right forearm in splint and fitting more appropriately.  CAM boot in place.  Neurological: He is alert and oriented to person, place, and time.    Speech is clear  improved insight and awareness although ongoing short-term memory deficits motor: RUE: 3/5 Delt , biceps hand grip 4/5 LUE: 5/5 proximal to distal RLE: HF 3/5, distally in boot, able to wiggle toes--remains stable, limited by  pain--stable LLE: 3+/5 proximal to distal--stable Sensation intact to light touch in all 4's Skin: Wounds all healing and very superficial at this point    Psychiatric: Pleasant and cooperative     Assessment/Plan: 1. 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 Physiatrist is providing close team supervision and 24 hour management of active medical problems listed below. Physiatrist and rehab team continue to assess barriers to discharge/monitor patient progress toward functional and medical goals.  Function:  Bathing Bathing position   Position: Sitting EOB  Bathing parts Body parts bathed by patient: Chest, Abdomen, Front perineal area, Left upper leg, Left lower leg, Right upper leg, Buttocks, Left arm, Right arm Body parts bathed by helper: Back, Right lower leg  Bathing assist Assist Level: Touching or steadying assistance(Pt > 75%)      Upper Body Dressing/Undressing Upper body dressing   What is the patient wearing?: Hospital gown     Pull over shirt/dress - Perfomed by patient: Thread/unthread left sleeve, Put head through opening, Pull shirt over trunk Pull over shirt/dress - Perfomed by helper: Thread/unthread right sleeve        Upper body assist Assist Level: Set up, Supervision or verbal cues   Set up : To obtain clothing/put away  Lower Body Dressing/Undressing Lower body dressing   What is the patient wearing?: Non-skid slipper  socks     Pants- Performed by patient: Thread/unthread left pants leg, Thread/unthread right pants leg, Pull pants up/down Pants- Performed by helper: Thread/unthread right pants leg, Pull pants up/down Non-skid slipper socks- Performed by patient: Don/doff left sock                    Lower body assist Assist for lower body dressing: Set up, Supervision or verbal cues, Assistive device, Touching or  steadying assistance (Pt > 75%) Assistive Device Comment: Programmer, systems activity did not occur: Safety/medical concerns Toileting steps completed by patient: Adjust clothing prior to toileting, Performs perineal hygiene, Adjust clothing after toileting Toileting steps completed by helper: Adjust clothing prior to toileting, Performs perineal hygiene, Adjust clothing after toileting    Toileting assist Assist level: Touching or steadying assistance (Pt.75%)   Transfers Chair/bed transfer Chair/bed transfer activity did not occur: N/A Chair/bed transfer method: Stand pivot Chair/bed transfer assist level: Supervision or verbal cues Chair/bed transfer assistive device: Bedrails, Armrests, Orthosis     Locomotion Ambulation Ambulation activity did not occur: N/A   Max distance: 50 Assist level: Touching or steadying assistance (Pt > 75%)   Wheelchair Wheelchair activity did not occur: N/A Type: Manual Max wheelchair distance: 200 Assist Level: Supervision or verbal cues  Cognition Comprehension Comprehension assist level: Follows basic conversation/direction with extra time/assistive device  Expression Expression assist level: Expresses basic 90% of the time/requires cueing < 10% of the time.  Social Interaction Social Interaction assist level: Interacts appropriately with others with medication or extra time (anti-anxiety, antidepressant).  Problem Solving Problem solving assist level: Solves complex problems: With extra time  Memory Memory assist level: More than reasonable amount of time   Medical Problem List and Plan: 1.  Cognitive deficits with delayed processing/sequencing, weight bearing restrictions on right as well as left sided weakness secondary to TBI/polytrauma.    -Continue PT OT and speech therapies.     -Finalize education and training for tomorrow    -Patient to see Rehab MD in the office for transitional care encounter in 1-2 weeks. .   2.  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     -continue therapeutic dose lovenox, no breathing difficulties    -up as tolerated 3. Pain Management: Hydrocodone prn, pain control appears adequate   -heat/ROM prn 4. Mood: LCSW to follow for evaluation and support.  5. Neuropsych: This patient is not capable of making decisions on his  own behalf. 6. Skin/Wound Care: Change sacral dressing to foam dressing  7. Fluids/Electrolytes/Nutrition: Monitor I/O.   Good p.o. intake at present 8. HTN: Continue metoprolol, lisinopril and HCTZ.  Continue to monitor bid and titrate medications as indicated.  Blood pressure controlled 02/11/2017 Vitals:   02/17/17 0500 02/17/17 0747  BP: (!) 111/58 138/83  Pulse: 60 (!) 55  Resp: 16   Temp: 98.4 F (36.9 C)   SpO2: 100%    9. ABLA: hgb 9.7 today 10. T2DM: Hgb A1c- 8.5. CBG (last 3)  Recent Labs    02/16/17 1630 02/16/17 2206 02/17/17 0635  GLUCAP 97 82 97      -Generally reasonable control     -Glucotrol adjusted to 10 mg every morning and 5 mg every afternoon/.  Sugars running a little bit low, will decrease to 5 mg twice daily    -continue cm medium diet             11.Urinary retention:weaning off urecholine and Flomax for now.   Pt  seems to be voiding.  12. Right open segmental tibial fracture s/p ORIF: NWB RLE with CAM boot on at all times---follow up X rays stable/improving.   See below  13. Right distal radius fracture s/p ORIF: Ok to weight bear on right elbow-- follow up X rays stable/improving. Follow up with Dr. Kerry FortHaddick in about 2 weeks where he will likely advance weightbearing on the right side.  14. Multifocal non-displaced C2 fracture: Aspen collar to remain on for 3 months from injury.  Follow-up with neurosurgery as an outpatient  15.  Constipation: Moving bowels  LOS (Days) 19 A FACE TO FACE EVALUATION WAS PERFORMED  Ranelle OysterSWARTZ,Monia Timmers T, MD 02/17/2017 8:46 AM

## 2017-02-17 NOTE — Patient Instructions (Signed)
Knee Extension (Sitting)    Sitting on a chair or edge of the bed straighten Right knee fully, lower slowly. Repeat __10__ times per set. Do __1__ sets per session. Do __2__ sessions per day.  http://orth.exer.us/732   Copyright  VHI. All rights reserved.  Strengthening: Straight Leg Raise (Phase 1)    Tighten muscles on front of right thigh, then lift leg _6___ inches from surface, keeping knee locked.  Repeat __5-10__ times per set. Do __1-2__ sets per session. Do __2__ sessions per day.  http://orth.exer.us/614   Copyright  VHI. All rights reserved.  Strengthening: Hip Adduction - Isometric    With ball or folded pillow between knees, squeeze knees together. Hold __5__ seconds. Repeat _10___ times per set. Do __1__ sets per session. Do __2__ sessions per day.  http://orth.exer.us/612   Copyright  VHI. All rights reserved.  Strengthening: Hip Abductor - Resisted    With band looped around both legs above knees, push thighs apart. Repeat _10___ times per set. Do __1__ sets per session. Do __2__ sessions per day.  http://orth.exer.us/688   Copyright  VHI. All rights reserved.  Bridging: Supine Side/Side (Active)    Lie with knees bent and pillow under knees, Push hips up and over so trunk is in line with legs and feet.  Complete __1_ sets of _10__ repetitions. Perform __2_ sessions per day.  Copyright  VHI. All rights reserved.

## 2017-02-18 LAB — GLUCOSE, CAPILLARY
Glucose-Capillary: 120 mg/dL — ABNORMAL HIGH (ref 65–99)
Glucose-Capillary: 103 mg/dL — ABNORMAL HIGH (ref 65–99)

## 2017-02-18 MED ORDER — RIVAROXABAN 20 MG PO TABS: 20.0000 mg | ORAL_TABLET | Freq: Every day | ORAL | 0 refills | Status: DC

## 2017-02-18 MED ORDER — LISINOPRIL 20 MG PO TABS: 20.0000 mg | ORAL_TABLET | Freq: Every day | ORAL | 0 refills | Status: DC

## 2017-02-18 MED ORDER — TRAMADOL HCL 50 MG PO TABS: 50.0000 mg | ORAL_TABLET | Freq: Four times a day (QID) | ORAL | 0 refills | Status: DC | PRN

## 2017-02-18 MED ORDER — SIMVASTATIN 10 MG PO TABS: 10.0000 mg | ORAL_TABLET | Freq: Every day | ORAL | 0 refills | Status: DC

## 2017-02-18 MED ORDER — GLIPIZIDE 5 MG PO TABS: 5.0000 mg | ORAL_TABLET | Freq: Two times a day (BID) | ORAL | 0 refills | Status: DC

## 2017-02-18 MED ORDER — TAMSULOSIN HCL 0.4 MG PO CAPS: 0.4000 mg | ORAL_CAPSULE | Freq: Every day | ORAL | 0 refills | Status: DC

## 2017-02-18 MED ORDER — PANTOPRAZOLE SODIUM 40 MG PO TBEC: 40.0000 mg | DELAYED_RELEASE_TABLET | Freq: Every day | ORAL | 0 refills | Status: DC

## 2017-02-18 MED ORDER — SENNOSIDES-DOCUSATE SODIUM 8.6-50 MG PO TABS: 2.0000 | ORAL_TABLET | Freq: Every day | ORAL | 0 refills | Status: DC

## 2017-02-18 MED ORDER — METOPROLOL TARTRATE 100 MG PO TABS: 100.0000 mg | ORAL_TABLET | Freq: Two times a day (BID) | ORAL | 0 refills | Status: AC

## 2017-02-18 MED ORDER — RIVAROXABAN 15 MG PO TABS: 15.0000 mg | ORAL_TABLET | Freq: Two times a day (BID) | ORAL | 0 refills | Status: DC

## 2017-02-18 MED ORDER — ASPIRIN 325 MG PO TBEC: 325.0000 mg | DELAYED_RELEASE_TABLET | Freq: Every day | ORAL | 0 refills | Status: DC

## 2017-02-18 MED ORDER — HYDROCHLOROTHIAZIDE 25 MG PO TABS: 25.0000 mg | ORAL_TABLET | Freq: Every day | ORAL | 0 refills | Status: DC

## 2017-02-18 NOTE — Progress Notes (Signed)
Pt discharged to home with mother and family. Discharge instructions given to pt and mother by Marissa NestlePam Love, PA with verbal understanding.

## 2017-02-18 NOTE — Progress Notes (Signed)
Social Work  Discharge Note  The overall goal for the admission was met for:   Discharge location: Yes - discharging home with his mother providing primary care 24/7  Length of Stay: Yes - 20 days  Discharge activity level: Yes - supervision to min assist  Home/community participation: Yes  Services provided included: MD, RD, PT, OT, SLP, RN, TR, Pharmacy, Neuropsych and SW  Financial Services: Medicaid and SSD applications pending  Follow-up services arranged: Home Health: RN, PT, OT via [facility], DME: 18x18 lightweight w/c with ELRs, jay 2 cushion, rolling walker with right platform attachment, 3n1 commode via [facility] and Patient/Family has no preference for HH/DME agencies  Comments (or additional information):  Patient/Family verbalized understanding of follow-up arrangements: Yes  Individual responsible for coordination of the follow-up plan: pt  Confirmed correct DME delivered: Dorien Mayotte 02/18/2017    Sunny Gains

## 2017-02-18 NOTE — Progress Notes (Signed)
[facility] PHYSICAL MEDICINE & REHABILITATION     PROGRESS NOTE    Subjective/Complaints: No new issues.  Happy to be going home.  Appreciative of the rehab team and their efforts  ROS: pt denies nausea, vomiting, diarrhea, cough, shortness of breath or chest pain    Objective: Vital Signs: Blood pressure 128/70, pulse (!) 58, temperature 98.8 F (37.1 C), temperature source Oral, resp. rate 16, height 5\' 11"  (1.803 m), weight 98.8 kg (217 lb 13 oz), SpO2 100 %. No results found. No results for input(s): WBC, HGB, HCT, PLT in the last 72 hours. No results for input(s): NA, K, CL, GLUCOSE, BUN, CREATININE, CALCIUM in the last 72 hours.  Invalid input(s): CO CBG (last 3)  Recent Labs    02/17/17 1636 02/17/17 2055 02/18/17 0643  GLUCAP 84 96 120*    Wt Readings from Last 3 Encounters:  02/03/17 98.8 kg (217 lb 13 oz)  01/24/17 95.4 kg (210 lb 5.1 oz)  11/29/15 99.8 kg (220 lb)    Physical Exam:  Constitutional: He is oriented to person, place, and time. He appears well-developed and well-nourished. No distress.  HENT:  Head: Normocephalic and atraumatic.  Mouth/Throat: oral mucosa moist     Eyes: Conjunctivae are normal. Pupils are equal, round, and reactive to light.    Neck:  Soft collar in place.   Cardiovascular: brady           Respiratory: CTA Bilaterally without wheezes or rales. Normal effort  GI: Soft. Bowel sounds are normal. He exhibits no distension. There is no tenderness.  Musculoskeletal: He exhibits trace edema  .  Right forearm in splint and fitting more appropriately.  CAM boot in place.  Neurological: He is alert and oriented to person, place, and time.    Speech is clear  improved insight and awareness although ongoing short-term memory deficits motor: RUE: 3/5 Delt , biceps hand grip 4/5 LUE: 5/5 proximal to distal RLE: HF 3/5, distally in boot, able to wiggle toes--remains stable, limited by pain--stable LLE: 3+/5 proximal to  distal--stable Sensation intact to light touch in all 4's Skin: Wounds all healing and very superficial at this point    Psychiatric: Pleasant and cooperative     Assessment/Plan: 1. 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 Physiatrist is providing close team supervision and 24 hour management of active medical problems listed below. Physiatrist and rehab team continue to assess barriers to discharge/monitor patient progress toward functional and medical goals.  Function:  Bathing Bathing position   Position: Sitting EOB  Bathing parts Body parts bathed by patient: Chest, Abdomen, Front perineal area, Left upper leg, Left lower leg, Right upper leg, Buttocks, Left arm, Right arm Body parts bathed by helper: Back, Right lower leg  Bathing assist Assist Level: Touching or steadying assistance(Pt > 75%)      Upper Body Dressing/Undressing Upper body dressing   What is the patient wearing?: Pull over shirt/dress     Pull over shirt/dress - Perfomed by patient: Thread/unthread left sleeve, Put head through opening, Pull shirt over trunk, Thread/unthread right sleeve Pull over shirt/dress - Perfomed by helper: Thread/unthread right sleeve        Upper body assist Assist Level: Set up, Supervision or verbal cues   Set up : To obtain clothing/put away, To apply TLSO, cervical collar  Lower Body Dressing/Undressing Lower body dressing   What is the  patient wearing?: Pants, Non-skid slipper socks     Pants- Performed by patient: Thread/unthread left pants leg, Thread/unthread right pants leg, Pull pants up/down Pants- Performed by helper: Thread/unthread right pants leg, Pull pants up/down Non-skid slipper socks- Performed by patient: Don/doff left sock                    Lower body assist Assist for lower body dressing: Set up, Supervision or  verbal cues, Assistive device, Touching or steadying assistance (Pt > 75%) Assistive Device Comment: Programmer, systems activity did not occur: Safety/medical concerns Toileting steps completed by patient: Adjust clothing prior to toileting, Performs perineal hygiene, Adjust clothing after toileting Toileting steps completed by helper: Adjust clothing prior to toileting, Performs perineal hygiene, Adjust clothing after toileting    Toileting assist Assist level: Touching or steadying assistance (Pt.75%)   Transfers Chair/bed transfer Chair/bed transfer activity did not occur: N/A Chair/bed transfer method: Stand pivot Chair/bed transfer assist level: Supervision or verbal cues Chair/bed transfer assistive device: Bedrails, Armrests, Orthosis(camboot)     Locomotion Ambulation Ambulation activity did not occur: N/A   Max distance: 50 Assist level: Supervision or verbal cues   Wheelchair Wheelchair activity did not occur: N/A Type: Manual Max wheelchair distance: 200 Assist Level: Supervision or verbal cues  Cognition Comprehension Comprehension assist level: Follows basic conversation/direction with extra time/assistive device  Expression Expression assist level: Expresses basic 90% of the time/requires cueing < 10% of the time.  Social Interaction Social Interaction assist level: Interacts appropriately with others with medication or extra time (anti-anxiety, antidepressant).  Problem Solving Problem solving assist level: Solves complex problems: With extra time  Memory Memory assist level: More than reasonable amount of time   Medical Problem List and Plan: 1.  Cognitive deficits with delayed processing/sequencing, weight bearing restrictions on right as well as left sided weakness secondary to TBI/polytrauma.    -Continue PT OT and speech therapies.     -Discharge home today    -Patient to see Rehab MD in the office for transitional care encounter in 1-2  weeks. .  2.  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     -continue therapeutic dose lovenox, no breathing difficulties    -up as tolerated 3. Pain Management: Hydrocodone prn, pain control appears adequate   -heat/ROM prn 4. Mood: LCSW to follow for evaluation and support.  5. Neuropsych: This patient is not capable of making decisions on his  own behalf. 6. Skin/Wound Care: Change sacral dressing to foam dressing  7. Fluids/Electrolytes/Nutrition: Monitor I/O.   Good p.o. intake at present 8. HTN: Continue metoprolol, lisinopril and HCTZ.  Continue to monitor bid and titrate medications as indicated.  Blood pressure controlled 02/18/2017 Vitals:   02/17/17 1303 02/18/17 0600  BP: (!) 117/59 128/70  Pulse: 60 (!) 58  Resp: 17 16  Temp: 98.4 F (36.9 C) 98.8 F (37.1 C)  SpO2: 98% 100%   9. ABLA: hgb 9.7 today 10. T2DM: Hgb A1c- 8.5. CBG (last 3)  Recent Labs    02/17/17 1636 02/17/17 2055 02/18/17 0643  GLUCAP 84 96 120*      -Generally reasonable control     -Discharge home on Glucotrol 5 mg twice daily    -continue cm medium diet             11.Urinary retention:weaning off urecholine and Flomax for now.   Pt seems to be voiding.  12. Right open segmental tibial fracture s/p ORIF: NWB RLE with  CAM boot on at all times---follow up X rays stable/improving.   See below  13. Right distal radius fracture s/p ORIF: Ok to weight bear on right elbow-- follow up X rays stable/improving. Follow up with Dr. Kerry FortHaddick in about 2 weeks where he will likely advance weightbearing on the right side.  14. Multifocal non-displaced C2 fracture: Aspen collar to remain on for 3 months from injury.  Follow-up with neurosurgery as an outpatient  15.  Constipation: Moving bowels  LOS (Days) 20 A FACE TO FACE EVALUATION WAS PERFORMED  Faith RogueSWARTZ,Jenesis Suchy T, MD 02/18/2017 9:00 AM

## 2017-02-18 NOTE — Discharge Instructions (Signed)
Inpatient Rehab Discharge Instructions  Mark LoseVernell Vanderslice Jr. Discharge date and time:  02/18/17  Activities/Precautions/ Functional Status: Activity: no lifting, driving, or strenuous exercise for till cleared by MD. No weight on right hand/forearm Diet: diabetic diet Wound Care: Twice a day --Wash around pins with soap and water, pat dry and pad with dressing and then put brace on.   Functional status:  ___ No restrictions     ___ Walk up steps independently _X__ 24/7 supervision/assistance  ___ Walk up steps with assistance ___ Intermittent supervision/assistance ___ Bathe/dress independently ___ Walk with walker    _X__ Bathe/dress with assistance ___ Walk Independently    ___ Shower independently ___ Walk with assistance    ___ Shower with assistance _X__ No alcohol     ___ Return to work/school ________   COMMUNITY REFERRALS UPON DISCHARGE:    Home Health:   PT     OT    RN                           Agency:  [facility] Phone: [phone]   Medical Equipment/Items Ordered:  Wheelchair, cushion, walker and commode                                                       Agency/Supplier:  [facility] @ [phone]  Special Instructions: 1. Need to continue aspirin for at least 6 months . 2. Dr. Bevely Palmeritty would like you to wear hard collar at all times so that your neck can heal.  3. Wear boot on right foot and splint on right hand at all times. No weight on right foot and right hand (can put weight on right elbow)   My questions have been answered and I understand these instructions. I will adhere to these goals and the provided educational materials after my discharge from the hospital.  Patient/Caregiver Signature _______________________________ Date __________  Clinician Signature _______________________________________ Date __________  Please bring this form and your medication list with you to all your follow-up doctor's appointments.    Information on my  medicine - XARELTO (rivaroxaban)  This medication education was reviewed with me or my healthcare representative as part of my discharge preparation.   WHY WAS XARELTO PRESCRIBED FOR YOU? Xarelto was prescribed to treat blood clots that may have been found in the veins of your legs (deep vein thrombosis) or in your lungs (pulmonary embolism) and to reduce the risk of them occurring again.  What do you need to know about Xarelto? The starting dose is one 15 mg tablet taken TWICE daily with food for the FIRST 21 DAYS then on 02/21/17  the dose is changed to one 20 mg tablet taken ONCE A DAY with your evening meal.  DO NOT stop taking Xarelto without talking to the health care provider who prescribed the medication.  Refill your prescription for 20 mg tablets before you run out.  After discharge, you should have regular check-up appointments with your healthcare provider that is prescribing your Xarelto.  In the future your dose may need to be changed if your kidney function changes by a significant amount.  What do you do if you miss a dose? If you are taking Xarelto TWICE DAILY and you miss a dose, take it as soon as  you remember. You may take two 15 mg tablets (total 30 mg) at the same time then resume your regularly scheduled 15 mg twice daily the next day.  If you are taking Xarelto ONCE DAILY and you miss a dose, take it as soon as you remember on the same day then continue your regularly scheduled once daily regimen the next day. Do not take two doses of Xarelto at the same time.   Important Safety Information Xarelto is a blood thinner medicine that can cause bleeding. You should call your healthcare provider right away if you experience any of the following: ? Bleeding from an injury or your nose that does not stop. ? Unusual colored urine (red or dark brown) or unusual colored stools (red or black). ? Unusual bruising for unknown reasons. ? A serious fall or if you hit your  head (even if there is no bleeding).  Some medicines may interact with Xarelto and might increase your risk of bleeding while on Xarelto. To help avoid this, consult your healthcare provider or pharmacist prior to using any new prescription or non-prescription medications, including herbals, vitamins, non-steroidal anti-inflammatory drugs (NSAIDs) and supplements.  This website has more information on Xarelto: [email or web address].xarelto.com.

## 2017-03-31 ENCOUNTER — Encounter: Payer: Self-pay | Admitting: Physical Medicine & Rehabilitation

## 2017-03-31 ENCOUNTER — Encounter: Payer: Self-pay | Attending: Physical Medicine & Rehabilitation | Admitting: Physical Medicine & Rehabilitation

## 2017-03-31 ENCOUNTER — Inpatient Hospital Stay: Payer: Self-pay | Admitting: Physical Medicine & Rehabilitation

## 2017-03-31 VITALS — BP 127/73 | HR 69 | Resp 14

## 2017-03-31 DIAGNOSIS — S12101S Unspecified nondisplaced fracture of second cervical vertebra, sequela: Secondary | ICD-10-CM | POA: Insufficient documentation

## 2017-03-31 DIAGNOSIS — E119 Type 2 diabetes mellitus without complications: Secondary | ICD-10-CM | POA: Insufficient documentation

## 2017-03-31 DIAGNOSIS — X58XXXS Exposure to other specified factors, sequela: Secondary | ICD-10-CM | POA: Insufficient documentation

## 2017-03-31 DIAGNOSIS — S52591E Other fractures of lower end of right radius, subsequent encounter for open fracture type I or II with routine healing: Secondary | ICD-10-CM | POA: Insufficient documentation

## 2017-03-31 DIAGNOSIS — Z87891 Personal history of nicotine dependence: Secondary | ICD-10-CM | POA: Insufficient documentation

## 2017-03-31 DIAGNOSIS — R4189 Other symptoms and signs involving cognitive functions and awareness: Secondary | ICD-10-CM | POA: Insufficient documentation

## 2017-03-31 DIAGNOSIS — I82403 Acute embolism and thrombosis of unspecified deep veins of lower extremity, bilateral: Secondary | ICD-10-CM | POA: Insufficient documentation

## 2017-03-31 DIAGNOSIS — S12191S Other nondisplaced fracture of second cervical vertebra, sequela: Secondary | ICD-10-CM

## 2017-03-31 DIAGNOSIS — S069X9S Unspecified intracranial injury with loss of consciousness of unspecified duration, sequela: Secondary | ICD-10-CM

## 2017-03-31 DIAGNOSIS — I10 Essential (primary) hypertension: Secondary | ICD-10-CM | POA: Insufficient documentation

## 2017-03-31 DIAGNOSIS — S82261S Displaced segmental fracture of shaft of right tibia, sequela: Secondary | ICD-10-CM

## 2017-03-31 DIAGNOSIS — M109 Gout, unspecified: Secondary | ICD-10-CM | POA: Insufficient documentation

## 2017-03-31 DIAGNOSIS — R531 Weakness: Secondary | ICD-10-CM | POA: Insufficient documentation

## 2017-03-31 DIAGNOSIS — Z7901 Long term (current) use of anticoagulants: Secondary | ICD-10-CM | POA: Insufficient documentation
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
## 2017-03-31 NOTE — Patient Instructions (Signed)
Check with your bosses over the next month or so to figure out what he needs to know about your return to work.  You have to have clearance from the doctor or a letter?  Can you go back part-time initially?  Can you go back on light duty?   PLEASE FEEL FREE TO CALL OUR OFFICE WITH ANY PROBLEMS OR QUESTIONS [phone])

## 2017-04-01 ENCOUNTER — Encounter: Payer: Self-pay | Admitting: Physical Medicine & Rehabilitation

## 2017-04-09 ENCOUNTER — Ambulatory Visit: Payer: Self-pay | Attending: Physical Medicine & Rehabilitation

## 2017-04-09 DIAGNOSIS — S82261S Displaced segmental fracture of shaft of right tibia, sequela: Secondary | ICD-10-CM | POA: Insufficient documentation

## 2017-04-09 DIAGNOSIS — S12191S Other nondisplaced fracture of second cervical vertebra, sequela: Secondary | ICD-10-CM | POA: Insufficient documentation

## 2017-04-09 DIAGNOSIS — S069X9S Unspecified intracranial injury with loss of consciousness of unspecified duration, sequela: Secondary | ICD-10-CM | POA: Insufficient documentation

## 2017-04-09 DIAGNOSIS — M6281 Muscle weakness (generalized): Secondary | ICD-10-CM

## 2017-04-09 DIAGNOSIS — Z5189 Encounter for other specified aftercare: Secondary | ICD-10-CM | POA: Insufficient documentation

## 2017-04-09 DIAGNOSIS — R262 Difficulty in walking, not elsewhere classified: Secondary | ICD-10-CM
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
## 2017-04-21 ENCOUNTER — Ambulatory Visit: Payer: Self-pay

## 2017-04-21 DIAGNOSIS — M6281 Muscle weakness (generalized): Secondary | ICD-10-CM

## 2017-04-21 DIAGNOSIS — R262 Difficulty in walking, not elsewhere classified: Secondary | ICD-10-CM
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
## 2017-04-23 ENCOUNTER — Ambulatory Visit: Payer: Self-pay

## 2017-04-28 ENCOUNTER — Ambulatory Visit: Payer: Self-pay | Admitting: Physical Therapy

## 2017-04-28 ENCOUNTER — Encounter: Payer: Self-pay | Admitting: Physical Therapy

## 2017-04-28 DIAGNOSIS — S12101S Unspecified nondisplaced fracture of second cervical vertebra, sequela: Secondary | ICD-10-CM

## 2017-04-28 DIAGNOSIS — M6281 Muscle weakness (generalized): Secondary | ICD-10-CM

## 2017-04-28 DIAGNOSIS — S069X9S Unspecified intracranial injury with loss of consciousness of unspecified duration, sequela: Secondary | ICD-10-CM

## 2017-04-28 DIAGNOSIS — T148XXA Other injury of unspecified body region, initial encounter: Secondary | ICD-10-CM

## 2017-04-28 DIAGNOSIS — R262 Difficulty in walking, not elsewhere classified: Secondary | ICD-10-CM
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
## 2017-04-30 ENCOUNTER — Ambulatory Visit: Payer: Self-pay

## 2017-05-05 ENCOUNTER — Ambulatory Visit: Payer: Self-pay | Attending: Physical Medicine & Rehabilitation

## 2017-05-07 ENCOUNTER — Ambulatory Visit: Payer: Self-pay

## 2017-05-12 ENCOUNTER — Ambulatory Visit: Payer: Self-pay

## 2017-05-15 ENCOUNTER — Ambulatory Visit: Payer: Self-pay

## 2017-05-19 ENCOUNTER — Ambulatory Visit: Payer: Self-pay

## 2017-05-22 ENCOUNTER — Ambulatory Visit: Payer: Self-pay

## 2017-05-26 ENCOUNTER — Encounter: Payer: Self-pay | Attending: Physical Medicine & Rehabilitation | Admitting: Physical Medicine & Rehabilitation

## 2017-05-26 ENCOUNTER — Encounter: Payer: Self-pay | Admitting: Physical Medicine & Rehabilitation

## 2017-05-26 VITALS — BP 145/92 | HR 59 | Resp 14

## 2017-05-26 DIAGNOSIS — M109 Gout, unspecified: Secondary | ICD-10-CM | POA: Insufficient documentation

## 2017-05-26 DIAGNOSIS — I10 Essential (primary) hypertension: Secondary | ICD-10-CM | POA: Insufficient documentation

## 2017-05-26 DIAGNOSIS — S12101S Unspecified nondisplaced fracture of second cervical vertebra, sequela: Secondary | ICD-10-CM | POA: Insufficient documentation

## 2017-05-26 DIAGNOSIS — E119 Type 2 diabetes mellitus without complications: Secondary | ICD-10-CM | POA: Insufficient documentation

## 2017-05-26 DIAGNOSIS — I82403 Acute embolism and thrombosis of unspecified deep veins of lower extremity, bilateral: Secondary | ICD-10-CM | POA: Insufficient documentation

## 2017-05-26 DIAGNOSIS — S069X9S Unspecified intracranial injury with loss of consciousness of unspecified duration, sequela: Secondary | ICD-10-CM | POA: Insufficient documentation

## 2017-05-26 DIAGNOSIS — S069X2S Unspecified intracranial injury with loss of consciousness of 31 minutes to 59 minutes, sequela: Secondary | ICD-10-CM

## 2017-05-26 DIAGNOSIS — S52591E Other fractures of lower end of right radius, subsequent encounter for open fracture type I or II with routine healing: Secondary | ICD-10-CM | POA: Insufficient documentation

## 2017-05-26 DIAGNOSIS — R531 Weakness: Secondary | ICD-10-CM | POA: Insufficient documentation

## 2017-05-26 DIAGNOSIS — Z7901 Long term (current) use of anticoagulants: Secondary | ICD-10-CM | POA: Insufficient documentation

## 2017-05-26 DIAGNOSIS — Z87891 Personal history of nicotine dependence: Secondary | ICD-10-CM | POA: Insufficient documentation

## 2017-05-26 DIAGNOSIS — R4189 Other symptoms and signs involving cognitive functions and awareness: Secondary | ICD-10-CM | POA: Insufficient documentation
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
## 2017-05-26 NOTE — Patient Instructions (Signed)
RETURN TO DRIVING PLAN:  WITH THE SUPERVISION OF A LICENSED DRIVER, PLEASE DRIVE IN AN EMPTY PARKING LOT FOR AT LEAST 2-3 TRIALS TO TEST REACTION TIME, VISION, USE OF EQUIPMENT IN CAR, ETC.  IF SUCCESSFUL WITH THE PARKING LOT DRIVING, PROCEED TO SUPERVISED DRIVING TRIALS IN YOUR NEIGHBORHOOD STREETS AT LOW TRAFFIC TIMES TO TEST OBSERVATION TO TRAFFIC SIGNALS, REACTION TIME, ETC. PLEASE ATTEMPT AT LEAST 2-3 TRIALS IN YOUR NEIGHBORHOOD.  IF NEIGHBORHOOD DRIVING IS SUCCESSFUL, YOU MAY PROCEED TO DRIVING IN BUSIER AREAS IN YOUR COMMUNITY WITH SUPERVISION OF A LICENSED DRIVER. PLEASE ATTEMPT AT LEAST 4-5 TRIALS.  IF COMMUNITY DRIVING IS SUCCESSFUL, YOU MAY PROCEED TO DRIVING ALONE, DURING THE DAY TIME, IN NON-PEAK TRAFFIC TIMES. YOU SHOULD DRIVE NO FURTHER THAN [phone] MINUTES IN ONE DIRECTION. PLEASE DO NOT DRIVE IF YOU FEEL FATIGUED OR UNDER THE INFLUENCE OF MEDICATION.

## 2017-05-27 ENCOUNTER — Ambulatory Visit: Payer: Self-pay

## 2017-05-29 ENCOUNTER — Ambulatory Visit: Payer: Self-pay

## 2017-11-26 ENCOUNTER — Encounter: Payer: Self-pay | Attending: Physical Medicine & Rehabilitation | Admitting: Physical Medicine & Rehabilitation

## 2017-11-26 ENCOUNTER — Encounter: Payer: Self-pay | Admitting: Physical Medicine & Rehabilitation

## 2017-11-26 VITALS — BP 174/103 | HR 56 | Resp 14 | Ht 67.0 in | Wt 238.0 lb

## 2017-11-26 DIAGNOSIS — Z87891 Personal history of nicotine dependence: Secondary | ICD-10-CM | POA: Insufficient documentation

## 2017-11-26 DIAGNOSIS — F09 Unspecified mental disorder due to known physiological condition: Secondary | ICD-10-CM | POA: Insufficient documentation

## 2017-11-26 DIAGNOSIS — E119 Type 2 diabetes mellitus without complications: Secondary | ICD-10-CM | POA: Insufficient documentation

## 2017-11-26 DIAGNOSIS — M7501 Adhesive capsulitis of right shoulder: Secondary | ICD-10-CM

## 2017-11-26 DIAGNOSIS — Z7901 Long term (current) use of anticoagulants: Secondary | ICD-10-CM | POA: Insufficient documentation

## 2017-11-26 DIAGNOSIS — R531 Weakness: Secondary | ICD-10-CM | POA: Insufficient documentation

## 2017-11-26 DIAGNOSIS — M109 Gout, unspecified: Secondary | ICD-10-CM | POA: Insufficient documentation

## 2017-11-26 DIAGNOSIS — I10 Essential (primary) hypertension: Secondary | ICD-10-CM | POA: Insufficient documentation

## 2017-11-26 DIAGNOSIS — S069X2S Unspecified intracranial injury with loss of consciousness of 31 minutes to 59 minutes, sequela: Secondary | ICD-10-CM

## 2017-11-26 HISTORY — DX: Adhesive capsulitis of right shoulder: M75.01
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
## 2017-11-26 NOTE — Patient Instructions (Signed)
PLEASE FEEL FREE TO CALL OUR OFFICE WITH ANY PROBLEMS OR QUESTIONS ([phone])      

## 2018-02-02 ENCOUNTER — Encounter: Payer: Self-pay | Attending: Physical Medicine & Rehabilitation | Admitting: Physical Medicine & Rehabilitation

## 2018-02-02 ENCOUNTER — Other Ambulatory Visit: Payer: Self-pay

## 2018-02-02 ENCOUNTER — Encounter: Payer: Self-pay | Admitting: Physical Medicine & Rehabilitation

## 2018-02-02 VITALS — BP 145/88 | HR 63 | Ht 66.0 in | Wt 237.2 lb

## 2018-02-02 DIAGNOSIS — F09 Unspecified mental disorder due to known physiological condition: Secondary | ICD-10-CM | POA: Insufficient documentation

## 2018-02-02 DIAGNOSIS — Z87891 Personal history of nicotine dependence: Secondary | ICD-10-CM | POA: Insufficient documentation

## 2018-02-02 DIAGNOSIS — R531 Weakness: Secondary | ICD-10-CM | POA: Insufficient documentation

## 2018-02-02 DIAGNOSIS — M109 Gout, unspecified: Secondary | ICD-10-CM | POA: Insufficient documentation

## 2018-02-02 DIAGNOSIS — E119 Type 2 diabetes mellitus without complications: Secondary | ICD-10-CM | POA: Insufficient documentation

## 2018-02-02 DIAGNOSIS — I10 Essential (primary) hypertension: Secondary | ICD-10-CM | POA: Insufficient documentation

## 2018-02-02 DIAGNOSIS — I82413 Acute embolism and thrombosis of femoral vein, bilateral: Secondary | ICD-10-CM

## 2018-02-02 DIAGNOSIS — M7501 Adhesive capsulitis of right shoulder: Secondary | ICD-10-CM | POA: Insufficient documentation

## 2018-02-02 DIAGNOSIS — S069X2S Unspecified intracranial injury with loss of consciousness of 31 minutes to 59 minutes, sequela: Secondary | ICD-10-CM

## 2018-02-02 DIAGNOSIS — Z7901 Long term (current) use of anticoagulants: Secondary | ICD-10-CM | POA: Insufficient documentation

## 2018-02-02 NOTE — Patient Instructions (Signed)
CONTINUE THE XARELTO UNTIL YOU HEAR FROM US.
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
## 2018-07-11 ENCOUNTER — Other Ambulatory Visit: Payer: Self-pay

## 2018-07-11 ENCOUNTER — Encounter: Payer: Self-pay | Admitting: Emergency Medicine

## 2018-07-11 ENCOUNTER — Emergency Department
Admission: EM | Admit: 2018-07-11 | Discharge: 2018-07-11 | Disposition: A | Discharge status: Home / Self Care | Payer: Medicaid Other | Attending: Emergency Medicine | Admitting: Emergency Medicine

## 2018-07-11 DIAGNOSIS — E119 Type 2 diabetes mellitus without complications: Secondary | ICD-10-CM | POA: Insufficient documentation

## 2018-07-11 DIAGNOSIS — K047 Periapical abscess without sinus: Secondary | ICD-10-CM

## 2018-07-11 DIAGNOSIS — Z87891 Personal history of nicotine dependence: Secondary | ICD-10-CM | POA: Insufficient documentation

## 2018-07-11 DIAGNOSIS — Z79899 Other long term (current) drug therapy: Secondary | ICD-10-CM | POA: Insufficient documentation

## 2018-07-11 DIAGNOSIS — Z7901 Long term (current) use of anticoagulants: Secondary | ICD-10-CM | POA: Insufficient documentation

## 2018-07-11 DIAGNOSIS — I10 Essential (primary) hypertension: Secondary | ICD-10-CM | POA: Insufficient documentation

## 2018-07-11 DIAGNOSIS — Z7982 Long term (current) use of aspirin: Secondary | ICD-10-CM | POA: Insufficient documentation

## 2018-07-11 DIAGNOSIS — K0889 Other specified disorders of teeth and supporting structures: Secondary | ICD-10-CM

## 2018-07-11 DIAGNOSIS — Z7984 Long term (current) use of oral hypoglycemic drugs: Secondary | ICD-10-CM | POA: Insufficient documentation

## 2018-07-11 MED ORDER — IBUPROFEN 800 MG PO TABS
800.0000 mg | ORAL_TABLET | Freq: Once | ORAL | Status: AC
  Administered 2018-07-11: 800 mg via ORAL
  Filled 2018-07-11: qty 1

## 2018-07-11 MED ORDER — AMOXICILLIN 875 MG PO TABS: 875.0000 mg | ORAL_TABLET | Freq: Two times a day (BID) | ORAL | 0 refills | Status: AC

## 2018-07-11 MED ORDER — AMOXICILLIN 500 MG PO CAPS
500.0000 mg | ORAL_CAPSULE | Freq: Once | ORAL | Status: AC
  Administered 2018-07-11: 500 mg via ORAL
  Filled 2018-07-11: qty 1

## 2018-07-11 MED ORDER — IBUPROFEN 800 MG PO TABS: 800.0000 mg | ORAL_TABLET | Freq: Three times a day (TID) | ORAL | 0 refills | Status: DC | PRN

## 2018-07-11 NOTE — ED Triage Notes (Signed)
Pt arrives POV and ambulatory to triage with c/o right upper dental pain x 1-1/2 weeks. Pt is in NAD.
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
## 2018-12-29 ENCOUNTER — Other Ambulatory Visit: Payer: Self-pay

## 2018-12-29 ENCOUNTER — Encounter: Payer: Self-pay | Admitting: Family Medicine

## 2018-12-29 ENCOUNTER — Ambulatory Visit: Payer: Self-pay | Admitting: Family Medicine

## 2018-12-29 DIAGNOSIS — N341 Nonspecific urethritis: Secondary | ICD-10-CM

## 2018-12-29 DIAGNOSIS — Z113 Encounter for screening for infections with a predominantly sexual mode of transmission: Secondary | ICD-10-CM

## 2018-12-29 LAB — GRAM STAIN

## 2018-12-29 MED ORDER — AZITHROMYCIN 500 MG PO TABS
1000.0000 mg | ORAL_TABLET | Freq: Once | ORAL | Status: AC
Start: 2018-12-29 — End: 2018-12-29
  Administered 2018-12-29: 13:00:00 1000 mg via ORAL

## 2018-12-29 NOTE — Progress Notes (Signed)
Gram stain reviewed. Patient treated for NGU per SO..Vyom Brass Brewer-Jensen, RN 

## 2018-12-30 ENCOUNTER — Encounter: Payer: Self-pay | Admitting: Family Medicine
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
## 2019-01-02 LAB — GONOCOCCUS CULTURE
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
## 2019-06-02 ENCOUNTER — Encounter: Payer: Self-pay | Admitting: Emergency Medicine

## 2019-06-02 ENCOUNTER — Emergency Department
Admission: EM | Admit: 2019-06-02 | Discharge: 2019-06-02 | Disposition: A | Discharge status: Home / Self Care | Payer: 59 | Attending: Emergency Medicine | Admitting: Emergency Medicine

## 2019-06-02 ENCOUNTER — Other Ambulatory Visit: Payer: Self-pay

## 2019-06-02 DIAGNOSIS — Z87891 Personal history of nicotine dependence: Secondary | ICD-10-CM | POA: Insufficient documentation

## 2019-06-02 DIAGNOSIS — R030 Elevated blood-pressure reading, without diagnosis of hypertension: Secondary | ICD-10-CM

## 2019-06-02 DIAGNOSIS — Z7982 Long term (current) use of aspirin: Secondary | ICD-10-CM | POA: Insufficient documentation

## 2019-06-02 DIAGNOSIS — E119 Type 2 diabetes mellitus without complications: Secondary | ICD-10-CM | POA: Diagnosis not present

## 2019-06-02 DIAGNOSIS — M109 Gout, unspecified: Secondary | ICD-10-CM | POA: Insufficient documentation

## 2019-06-02 DIAGNOSIS — I10 Essential (primary) hypertension: Secondary | ICD-10-CM

## 2019-06-02 DIAGNOSIS — Z79899 Other long term (current) drug therapy: Secondary | ICD-10-CM | POA: Insufficient documentation

## 2019-06-02 DIAGNOSIS — M25572 Pain in left ankle and joints of left foot: Secondary | ICD-10-CM | POA: Diagnosis present

## 2019-06-02 DIAGNOSIS — Z7984 Long term (current) use of oral hypoglycemic drugs: Secondary | ICD-10-CM | POA: Insufficient documentation

## 2019-06-02 MED ORDER — NAPROXEN 500 MG PO TABS: 500.0000 mg | ORAL_TABLET | Freq: Two times a day (BID) | ORAL | 0 refills | Status: DC

## 2019-06-02 MED ORDER — KETOROLAC TROMETHAMINE 30 MG/ML IJ SOLN
30.0000 mg | Freq: Once | INTRAMUSCULAR | Status: AC
  Administered 2019-06-02: 30 mg via INTRAMUSCULAR
  Filled 2019-06-02: qty 1

## 2019-06-02 NOTE — Discharge Instructions (Signed)
Follow-up with your primary care provider if any continued problems.  Also have them recheck your blood pressure as it was elevated in the emergency department.  Your initial blood pressure was 180/100 and should be less than that especially if you are taking your blood pressure medication as you indicated.

## 2019-06-02 NOTE — ED Notes (Signed)
See triage note  Presents with left ankle pain for about 3 days   Denies any injury  Swelling noted to ankle  States he has had a hx of gout and it feels the same  Good pulses
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
## 2019-06-02 NOTE — ED Triage Notes (Signed)
Left ankle pain x 2-3 days.  Patient states he has history of gout and pain feels the same.

## 2020-01-08 ENCOUNTER — Emergency Department
Admission: EM | Admit: 2020-01-08 | Discharge: 2020-01-08 | Disposition: A | Discharge status: Home / Self Care | Payer: No Typology Code available for payment source | Attending: Emergency Medicine | Admitting: Emergency Medicine

## 2020-01-08 ENCOUNTER — Encounter: Payer: Self-pay | Admitting: Emergency Medicine

## 2020-01-08 ENCOUNTER — Other Ambulatory Visit: Payer: Self-pay

## 2020-01-08 ENCOUNTER — Emergency Department: Payer: No Typology Code available for payment source

## 2020-01-08 DIAGNOSIS — Z87891 Personal history of nicotine dependence: Secondary | ICD-10-CM | POA: Diagnosis not present

## 2020-01-08 DIAGNOSIS — Z7984 Long term (current) use of oral hypoglycemic drugs: Secondary | ICD-10-CM | POA: Insufficient documentation

## 2020-01-08 DIAGNOSIS — Z7982 Long term (current) use of aspirin: Secondary | ICD-10-CM | POA: Diagnosis not present

## 2020-01-08 DIAGNOSIS — I10 Essential (primary) hypertension: Secondary | ICD-10-CM | POA: Insufficient documentation

## 2020-01-08 DIAGNOSIS — E119 Type 2 diabetes mellitus without complications: Secondary | ICD-10-CM | POA: Diagnosis not present

## 2020-01-08 DIAGNOSIS — S93601A Unspecified sprain of right foot, initial encounter: Secondary | ICD-10-CM | POA: Insufficient documentation

## 2020-01-08 DIAGNOSIS — Z79899 Other long term (current) drug therapy: Secondary | ICD-10-CM | POA: Diagnosis not present

## 2020-01-08 DIAGNOSIS — Z7901 Long term (current) use of anticoagulants: Secondary | ICD-10-CM | POA: Insufficient documentation

## 2020-01-08 DIAGNOSIS — Y92410 Unspecified street and highway as the place of occurrence of the external cause: Secondary | ICD-10-CM | POA: Diagnosis not present

## 2020-01-08 DIAGNOSIS — Y9389 Activity, other specified: Secondary | ICD-10-CM | POA: Diagnosis not present

## 2020-01-08 DIAGNOSIS — S99921A Unspecified injury of right foot, initial encounter: Secondary | ICD-10-CM | POA: Diagnosis present
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
## 2020-06-22 ENCOUNTER — Emergency Department
Admission: EM | Admit: 2020-06-22 | Discharge: 2020-06-22 | Disposition: A | Discharge status: Home / Self Care | Payer: Self-pay | Attending: Emergency Medicine | Admitting: Emergency Medicine

## 2020-06-22 ENCOUNTER — Other Ambulatory Visit: Payer: Self-pay

## 2020-06-22 DIAGNOSIS — Z79899 Other long term (current) drug therapy: Secondary | ICD-10-CM | POA: Insufficient documentation

## 2020-06-22 DIAGNOSIS — K047 Periapical abscess without sinus: Secondary | ICD-10-CM | POA: Insufficient documentation

## 2020-06-22 DIAGNOSIS — I10 Essential (primary) hypertension: Secondary | ICD-10-CM | POA: Insufficient documentation

## 2020-06-22 DIAGNOSIS — Z87891 Personal history of nicotine dependence: Secondary | ICD-10-CM | POA: Insufficient documentation

## 2020-06-22 DIAGNOSIS — Z7982 Long term (current) use of aspirin: Secondary | ICD-10-CM | POA: Insufficient documentation

## 2020-06-22 DIAGNOSIS — Z7984 Long term (current) use of oral hypoglycemic drugs: Secondary | ICD-10-CM | POA: Insufficient documentation

## 2020-06-22 DIAGNOSIS — E119 Type 2 diabetes mellitus without complications: Secondary | ICD-10-CM | POA: Insufficient documentation

## 2020-06-22 DIAGNOSIS — K0889 Other specified disorders of teeth and supporting structures: Secondary | ICD-10-CM

## 2020-06-22 MED ORDER — AMOXICILLIN-POT CLAVULANATE 875-125 MG PO TABS
1.0000 | ORAL_TABLET | Freq: Once | ORAL | Status: AC
  Administered 2020-06-22: 1 via ORAL
  Filled 2020-06-22: qty 1

## 2020-06-22 MED ORDER — LIDOCAINE VISCOUS HCL 2 % MT SOLN: 15.0000 mL | OROMUCOSAL | 0 refills | Status: AC | PRN

## 2020-06-22 MED ORDER — LIDOCAINE VISCOUS HCL 2 % MT SOLN
15.0000 mL | Freq: Once | OROMUCOSAL | Status: AC
  Administered 2020-06-22: 15 mL via OROMUCOSAL
  Filled 2020-06-22: qty 15

## 2020-06-22 MED ORDER — AMOXICILLIN-POT CLAVULANATE 875-125 MG PO TABS: 1.0000 | ORAL_TABLET | Freq: Two times a day (BID) | ORAL | 0 refills | Status: AC

## 2020-06-22 NOTE — Discharge Instructions (Addendum)
Take antibiotic as prescribed.  You have also been prescribed viscous lidocaine to use as needed topically for pain.  You may also use Tylenol, up to 1000 mg 4 times daily and/or ibuprofen, up to 600 mg 3 times daily in addition for pain.  Please follow-up with dental clinic as below.  OPTIONS FOR DENTAL FOLLOW UP CARE  [place] [facility] and [facility] Services - Local Safety Net Dental Clinics [email or web address]   [facility] [phone])  [facility] [phone])  [place] [phone])  [facility] [phone])  [facility] [phone]) This clinic caters to the indigent population and is on a lottery system. Location: [employer] [facility], [employer], [address], [place] Clinic Hours: Wednesdays from 6pm - 9pm, patients seen by a lottery system. For dates, call or go to [email or web address] Services: Cleanings, fillings and simple extractions. Payment Options: DENTAL WORK IS FREE OF CHARGE. Bring proof of income or support. Best way to get seen: Arrive at 5:15 pm - this is a lottery, NOT first come/first serve, so arriving earlier will not increase your chances of being seen.     [facility] [phone] Select option 1 for emergencies   Location: [facility], [place], [address] [place] Clinic Hours: No walk-ins accepted - call the day before to schedule an appointment. Check in times are 9:30 am and 1:30 pm. Services: Simple extractions, temporary fillings, pulpectomy/pulp debridement, uncomplicated abscess drainage. Payment Options: PAYMENT IS DUE AT THE TIME OF SERVICE.  Fee is usually $100-200, additional surgical procedures (e.g. abscess drainage) may be extra. Cash, checks, Visa/MasterCard accepted.  Can file Medicaid if patient is covered for  dental - patient should call case worker to check. No discount for [facility] patients. Best way to get seen: MUST call the day before and get onto the schedule. Can usually be seen the next 1-2 days. No walk-ins accepted.     [facility] [place] [facility] [phone]   Location: [facility], [address] [place] Clinic Hours: M, W, Th, F 8am or 1:30pm, Tues 9a or 1:30 - first come/first served. Services: Simple extractions, temporary fillings, uncomplicated abscess drainage.  You do not need to be an [facility] resident. Payment Options: PAYMENT IS DUE AT THE TIME OF SERVICE. Dental insurance, otherwise sliding scale - bring proof of income or support. Depending on income and treatment needed, cost is usually $50-200. Best way to get seen: Arrive early as it is first come/first served.     [facility] [phone]   Location: [address] Clinic Hours: Mon-Thu 8a-5p Services: Most basic dental services including extractions and fillings. Payment Options: PAYMENT IS DUE AT THE TIME OF SERVICE. Sliding scale, up to 50% off - bring proof if income or support. Medicaid with dental option accepted. Best way to get seen: Call to schedule an appointment, can usually be seen within 2 weeks OR they will try to see walk-ins - show up at 8a or 2p (you may have to wait).     [facility] [phone] [place] RESIDENTS ONLY   Location: [facility], [address] [place], [place] [address] Clinic Hours: By appointment only. Monday - Thursday 8am-5pm, Friday 8am-12pm Services: Cleanings, fillings, extractions. Payment Options: PAYMENT IS DUE AT THE TIME OF SERVICE. Cash, Visa or MasterCard. Sliding scale - $30 minimum per service. Best way to  get seen: Come in to office, complete packet and make an appointment - need proof of income or support monies for each  household member and proof of [facility] residence. Usually takes about a month to get in.     [facility] [phone]   Location: [address] [facility] Clinic Hours: Walk-in Urgent Care Dental Services are offered Monday-Friday mornings only. The numbers of emergencies accepted daily is limited to the number of providers available. Maximum 15 - Mondays, Wednesdays & Thursdays Maximum 10 - Tuesdays & Fridays Services: You do not need to be a [facility] resident to be seen for a dental emergency. Emergencies are defined as pain, swelling, abnormal bleeding, or dental trauma. Walkins will receive x-rays if needed. NOTE: Dental cleaning is not an emergency. Payment Options: PAYMENT IS DUE AT THE TIME OF SERVICE. Minimum co-pay is $40.00 for uninsured patients. Minimum co-pay is $3.00 for Medicaid with dental coverage. Dental Insurance is accepted and must be presented at time of visit. Medicare does not cover dental. Forms of payment: Cash, credit card, checks. Best way to get seen: If not previously registered with the clinic, walk-in dental registration begins at 7:15 am and is on a first come/first serve basis. If previously registered with the clinic, call to make an appointment.     [facility] [phone] [place] RESIDENTS ONLY   Location: [address] [place], [place] Clinic Hours: Mon-Thu 10a-2p Services: Extractions only! Payment Options: FREE (donations accepted) - bring proof of income or support Best way to get seen: Call and schedule an appointment OR come at 8am on the 1st Monday of every month (except for holidays) when it is first come/first served.     [facility] [phone]   Location: [address] [place], [place] Clinic Hours: Friday mornings Services, Payment Options, Best way to get seen: Call for info

## 2020-06-22 NOTE — ED Triage Notes (Signed)
Pt to ED POV for possible abscess to left lower gum x3 days. Denies fevers.  Took ibuprofen PTA with relief
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
## 2022-03-14 ENCOUNTER — Encounter: Payer: Self-pay | Admitting: Family Medicine

## 2022-03-14 ENCOUNTER — Ambulatory Visit: Payer: Self-pay | Admitting: Family Medicine

## 2022-03-14 DIAGNOSIS — Z113 Encounter for screening for infections with a predominantly sexual mode of transmission: Secondary | ICD-10-CM

## 2022-03-14 LAB — HM HIV SCREENING LAB: HM HIV Screening: NEGATIVE

## 2022-03-14 NOTE — Progress Notes (Signed)
[facility] Department STI clinic/screening visit  Subjective:  Mark Mcguire. is a 42 y.o. male being seen today for an STI screening visit. The patient reports they do not have symptoms.    Patient has the following medical conditions:   Patient Active Problem List   Diagnosis Date Noted   Urinary retention    Dysphagia    Anemia 01/12/2017   Hypoglycemia 01/03/2017   Diabetes mellitus type 2 in nonobese [facility]) 01/03/2017     Chief Complaint  Patient presents with   STD screen    HPI  Patient reports to clinic for STI testing   Last HIV test per patient/review of record was No results found for: "HMHIVSCREEN"  Lab Results  Component Value Date   HIV Non Reactive 01/03/2017    Does the patient or their partner desires a pregnancy in the next year? No  Screening for MPX risk: Does the patient have an unexplained rash? No Is the patient MSM? No Does the patient endorse multiple sex partners or anonymous sex partners? No Did the patient have close or sexual contact with a person diagnosed with MPX? No Has the patient traveled outside the [place] where MPX is endemic? No Is there a high clinical suspicion for MPX-- evidenced by one of the following No  -Unlikely to be chickenpox  -Lymphadenopathy/  -Rash that present in same phase of evolution on any given body part   See flowsheet for further details and programmatic requirements.   Immunization History  Administered Date(s) Administered   Influenza,inj,Quad PF,6+ Mos 01/11/2017   Pneumococcal Polysaccharide-23 01/11/2017   Tdap 01/03/2017     The following portions of the patient's history were reviewed and updated as appropriate: allergies, current medications, past medical history, past social history, past surgical history and problem list.  Objective:  There were no vitals filed for this visit.  Physical Exam Vitals and nursing note reviewed.  Constitutional:      Appearance: Normal  appearance.  HENT:     Head: Normocephalic and atraumatic.     Mouth/Throat:     Mouth: Mucous membranes are moist.     Pharynx: No oropharyngeal exudate or posterior oropharyngeal erythema.  Eyes:     General:        Right eye: No discharge.        Left eye: No discharge.     Conjunctiva/sclera:     Right eye: Right conjunctiva is not injected. No exudate.    Left eye: Left conjunctiva is not injected. No exudate. Pulmonary:     Effort: Pulmonary effort is normal.  Abdominal:     General: Abdomen is flat.     Palpations: Abdomen is soft. There is no hepatomegaly or mass.     Tenderness: There is no abdominal tenderness. There is no rebound.  Genitourinary:    Comments: Declined genital exam- no symptoms today. Lymphadenopathy:     Cervical: No cervical adenopathy.     Upper Body:     Right upper body: No supraclavicular or axillary adenopathy.     Left upper body: No supraclavicular or axillary adenopathy.  Skin:    General: Skin is warm and dry.  Neurological:     Mental Status: He is alert and oriented to person, place, and time.       Assessment and Plan:  Mark Mcguire. is a 42 y.o. male presenting to the [facility] Department for STI screening  1. Screening for venereal disease  - HIV  [place] [facility] - Syphilis Serology, [place] [facility] - Chlamydia/GC NAA, Confirmation - Gonococcus culture   Patient does not have STI symptoms Patient accepted all screenings including   Patient meets criteria for HepB screening? No. Ordered? not applicable Patient meets criteria for HepC screening? No. Ordered? not applicable Recommended condom use with all sex Discussed importance of condom use for STI prevent  Treat gram stain per standing order Discussed time line for [facility] results and that patient will be called with positive results and encouraged patient to call if he had not heard in 2 weeks Recommended returning for continued or worsening symptoms.    Return if symptoms worsen or fail to improve.  Total time spent 15 minutes  Sharlet Sal[place], [place]

## 2022-03-18 LAB — CHLAMYDIA/GC NAA, CONFIRMATION
Chlamydia trachomatis, NAA: NEGATIVE
Neisseria gonorrhoeae, NAA: NEGATIVE

## 2022-03-20 LAB — GONOCOCCUS CULTURE

## 2023-10-26 ENCOUNTER — Emergency Department: Payer: Self-pay

## 2023-10-26 ENCOUNTER — Observation Stay
Admission: EM | Admit: 2023-10-26 | Discharge: 2023-10-28 | Disposition: A | Discharge status: Home / Self Care | Payer: Self-pay | Attending: Emergency Medicine | Admitting: Emergency Medicine

## 2023-10-26 ENCOUNTER — Other Ambulatory Visit: Payer: Self-pay

## 2023-10-26 DIAGNOSIS — E785 Hyperlipidemia, unspecified: Secondary | ICD-10-CM | POA: Diagnosis not present

## 2023-10-26 DIAGNOSIS — R29701 NIHSS score 1: Secondary | ICD-10-CM | POA: Diagnosis not present

## 2023-10-26 DIAGNOSIS — Z8679 Personal history of other diseases of the circulatory system: Secondary | ICD-10-CM

## 2023-10-26 DIAGNOSIS — Z79899 Other long term (current) drug therapy: Secondary | ICD-10-CM | POA: Diagnosis not present

## 2023-10-26 DIAGNOSIS — Z87891 Personal history of nicotine dependence: Secondary | ICD-10-CM | POA: Insufficient documentation

## 2023-10-26 DIAGNOSIS — I6389 Other cerebral infarction: Principal | ICD-10-CM

## 2023-10-26 DIAGNOSIS — Z7984 Long term (current) use of oral hypoglycemic drugs: Secondary | ICD-10-CM | POA: Insufficient documentation

## 2023-10-26 DIAGNOSIS — Z91148 Patient's other noncompliance with medication regimen for other reason: Secondary | ICD-10-CM

## 2023-10-26 DIAGNOSIS — Z87820 Personal history of traumatic brain injury: Secondary | ICD-10-CM

## 2023-10-26 DIAGNOSIS — Z7902 Long term (current) use of antithrombotics/antiplatelets: Secondary | ICD-10-CM | POA: Insufficient documentation

## 2023-10-26 DIAGNOSIS — E119 Type 2 diabetes mellitus without complications: Secondary | ICD-10-CM | POA: Diagnosis not present

## 2023-10-26 DIAGNOSIS — R200 Anesthesia of skin: Secondary | ICD-10-CM | POA: Diagnosis present

## 2023-10-26 DIAGNOSIS — R202 Paresthesia of skin: Secondary | ICD-10-CM | POA: Diagnosis present

## 2023-10-26 DIAGNOSIS — E669 Obesity, unspecified: Secondary | ICD-10-CM | POA: Diagnosis not present

## 2023-10-26 DIAGNOSIS — I639 Cerebral infarction, unspecified: Secondary | ICD-10-CM | POA: Diagnosis not present

## 2023-10-26 DIAGNOSIS — Z7982 Long term (current) use of aspirin: Secondary | ICD-10-CM | POA: Diagnosis not present

## 2023-10-26 DIAGNOSIS — I160 Hypertensive urgency: Secondary | ICD-10-CM

## 2023-10-26 DIAGNOSIS — I10 Essential (primary) hypertension: Secondary | ICD-10-CM

## 2023-10-26 LAB — URINE DRUG SCREEN, QUALITATIVE (ARMC ONLY)
Amphetamines, Ur Screen: NOT DETECTED
Barbiturates, Ur Screen: NOT DETECTED
Benzodiazepine, Ur Scrn: NOT DETECTED
Cannabinoid 50 Ng, Ur ~~LOC~~: NOT DETECTED
Cocaine Metabolite,Ur ~~LOC~~: NOT DETECTED
MDMA (Ecstasy)Ur Screen: NOT DETECTED
Methadone Scn, Ur: NOT DETECTED
Opiate, Ur Screen: NOT DETECTED
Phencyclidine (PCP) Ur S: NOT DETECTED
Tricyclic, Ur Screen: NOT DETECTED

## 2023-10-26 LAB — CBC
HCT: 39 % (ref 39.0–52.0)
Hemoglobin: 12.8 g/dL — ABNORMAL LOW (ref 13.0–17.0)
MCH: 30 pg (ref 26.0–34.0)
MCHC: 32.8 g/dL (ref 30.0–36.0)
MCV: 91.3 fL (ref 80.0–100.0)
Platelets: 189 K/uL (ref 150–400)
RBC: 4.27 MIL/uL (ref 4.22–5.81)
RDW: 12.7 % (ref 11.5–15.5)
WBC: 6 K/uL (ref 4.0–10.5)
nRBC: 0 % (ref 0.0–0.2)

## 2023-10-26 LAB — COMPREHENSIVE METABOLIC PANEL WITH GFR
ALT: 28 U/L (ref 0–44)
AST: 24 U/L (ref 15–41)
Albumin: 4.1 g/dL (ref 3.5–5.0)
Alkaline Phosphatase: 118 U/L (ref 38–126)
Anion gap: 8 (ref 5–15)
BUN: 13 mg/dL (ref 6–20)
CO2: 25 mmol/L (ref 22–32)
Calcium: 9.4 mg/dL (ref 8.9–10.3)
Chloride: 107 mmol/L (ref 98–111)
Creatinine, Ser: 0.62 mg/dL (ref 0.61–1.24)
GFR, Estimated: >60 mL/min (ref >60)
Glucose, Bld: 171 mg/dL — ABNORMAL HIGH (ref 70–99)
Potassium: 4 mmol/L (ref 3.5–5.1)
Sodium: 140 mmol/L (ref 135–145)
Total Bilirubin: 0.7 mg/dL (ref 0.0–1.2)
Total Protein: 7.1 g/dL (ref 6.5–8.1)

## 2023-10-26 LAB — DIFFERENTIAL
Abs Immature Granulocytes: 0.03 K/uL (ref 0.00–0.07)
Basophils Absolute: 0 K/uL (ref 0.0–0.1)
Basophils Relative: 1 %
Eosinophils Absolute: 0.1 K/uL (ref 0.0–0.5)
Eosinophils Relative: 2 %
Immature Granulocytes: 1 %
Lymphocytes Relative: 45 %
Lymphs Abs: 2.8 K/uL (ref 0.7–4.0)
Monocytes Absolute: 0.4 K/uL (ref 0.1–1.0)
Monocytes Relative: 7 %
Neutro Abs: 2.6 K/uL (ref 1.7–7.7)
Neutrophils Relative %: 44 %

## 2023-10-26 LAB — ETHANOL: Alcohol, Ethyl (B): <15 mg/dL (ref <15)

## 2023-10-26 LAB — CBG MONITORING, ED: Glucose-Capillary: 173 mg/dL — ABNORMAL HIGH (ref 70–99)

## 2023-10-26 LAB — PROTIME-INR
INR: 1 (ref 0.8–1.2)
Prothrombin Time: 13.6 s (ref 11.4–15.2)

## 2023-10-26 LAB — APTT: aPTT: 27 s (ref 24–36)

## 2023-10-26 MED ORDER — STROKE: EARLY STAGES OF RECOVERY BOOK: Freq: Once | Status: AC

## 2023-10-26 MED ORDER — SIMVASTATIN 20 MG PO TABS: 10.0000 mg | ORAL_TABLET | Freq: Every day | ORAL | Status: DC

## 2023-10-26 MED ORDER — GLIPIZIDE 5 MG PO TABS
5.0000 mg | ORAL_TABLET | Freq: Two times a day (BID) | ORAL | Status: DC
  Filled 2023-10-26: qty 1

## 2023-10-26 MED ORDER — ONDANSETRON HCL 4 MG/2ML IJ SOLN: 4.0000 mg | INTRAMUSCULAR | Status: DC | PRN

## 2023-10-26 MED ORDER — METOPROLOL TARTRATE 50 MG PO TABS
100.0000 mg | ORAL_TABLET | Freq: Two times a day (BID) | ORAL | Status: DC
  Administered 2023-10-26: 100 mg via ORAL
  Filled 2023-10-26 (×2): qty 2

## 2023-10-26 MED ORDER — HYDRALAZINE HCL 20 MG/ML IJ SOLN: 10.0000 mg | Freq: Four times a day (QID) | INTRAMUSCULAR | Status: DC | PRN

## 2023-10-26 MED ORDER — HYDROCHLOROTHIAZIDE 25 MG PO TABS: 25.0000 mg | ORAL_TABLET | Freq: Every day | ORAL | Status: DC

## 2023-10-26 MED ORDER — ENOXAPARIN SODIUM 60 MG/0.6ML IJ SOSY
0.5000 mg/kg | PREFILLED_SYRINGE | INTRAMUSCULAR | Status: DC
  Administered 2023-10-26 – 2023-10-27 (×2): 50 mg via SUBCUTANEOUS
  Filled 2023-10-26 (×2): qty 0.6

## 2023-10-26 MED ORDER — SENNOSIDES-DOCUSATE SODIUM 8.6-50 MG PO TABS
1.0000 | ORAL_TABLET | Freq: Every evening | ORAL | Status: DC | PRN
Start: 2023-10-26 — End: 2023-10-28

## 2023-10-26 MED ORDER — CLOPIDOGREL BISULFATE 75 MG PO TABS
75.0000 mg | ORAL_TABLET | Freq: Every day | ORAL | Status: DC
  Administered 2023-10-26 – 2023-10-27 (×2): 75 mg via ORAL
  Filled 2023-10-26 (×2): qty 1

## 2023-10-26 MED ORDER — ACETAMINOPHEN 160 MG/5ML PO SOLN: 650.0000 mg | ORAL | Status: DC | PRN

## 2023-10-26 MED ORDER — IOHEXOL 350 MG/ML SOLN
75.0000 mL | Freq: Once | INTRAVENOUS | Status: AC | PRN
  Administered 2023-10-26: 75 mL via INTRAVENOUS

## 2023-10-26 MED ORDER — SODIUM CHLORIDE 0.9 % IV SOLN: INTRAVENOUS | Status: DC

## 2023-10-26 MED ORDER — TRAZODONE HCL 50 MG PO TABS: 25.0000 mg | ORAL_TABLET | Freq: Every evening | ORAL | Status: DC | PRN

## 2023-10-26 MED ORDER — PNEUMOCOCCAL 20-VAL CONJ VACC 0.5 ML IM SUSY
0.5000 mL | PREFILLED_SYRINGE | INTRAMUSCULAR | Status: AC
  Administered 2023-10-27: 0.5 mL via INTRAMUSCULAR
  Filled 2023-10-26: qty 0.5

## 2023-10-26 MED ORDER — ASPIRIN 81 MG PO CHEW
324.0000 mg | CHEWABLE_TABLET | Freq: Once | ORAL | Status: AC
  Administered 2023-10-26: 324 mg via ORAL
  Filled 2023-10-26: qty 4

## 2023-10-26 MED ORDER — LISINOPRIL 20 MG PO TABS: 20.0000 mg | ORAL_TABLET | Freq: Every day | ORAL | Status: DC

## 2023-10-26 MED ORDER — ACETAMINOPHEN 325 MG PO TABS: 650.0000 mg | ORAL_TABLET | ORAL | Status: DC | PRN

## 2023-10-26 MED ORDER — ACETAMINOPHEN 650 MG RE SUPP: 650.0000 mg | RECTAL | Status: DC | PRN

## 2023-10-26 MED ORDER — ASPIRIN 81 MG PO TBEC
81.0000 mg | DELAYED_RELEASE_TABLET | Freq: Every day | ORAL | Status: DC
  Administered 2023-10-27 – 2023-10-28 (×2): 81 mg via ORAL
  Filled 2023-10-26 (×2): qty 1
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
## 2023-10-26 NOTE — Progress Notes (Signed)
 Responded to code stroke paged out-when arrived pt was not in room. Staff confirmed he was off getting test done. Will round back if needed

## 2023-10-26 NOTE — ED Triage Notes (Signed)
 pt ambulatory to bed. Patient states he feels numbness. No headaches. dizzy when he stands, no blurry vision. He feels like his right arm went to sleep with pins and needles and on his right side of face. Started about an hour ago around 1800. No weakness just numbness on the right side.
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
## 2023-10-26 NOTE — ED Notes (Signed)
 Code Stroke was called to [facility] at 6:48 pm and spoke to [place].

## 2023-10-26 NOTE — H&P (Signed)
 Mark Mcguire   PATIENT NAME: Mark Mcguire    MR#:  [id number]  DATE OF BIRTH:  06/24/1980  DATE OF ADMISSION:  10/26/2023  PRIMARY CARE PHYSICIAN: Center, [facility]   Patient is coming from: Home  REQUESTING/REFERRING PHYSICIAN: Viviann Mungo, MD  CHIEF COMPLAINT:   Chief Complaint  Patient presents with   Numbness    HISTORY OF PRESENT ILLNESS:  Mark Christmas. is a 43 y.o. male with medical history significant for type 2 diabetes mellitus, hypertension and TBI, who presented to the emergency room with the onset of right-sided upper and lower extremity numbness and tingling that started around 5:45 PM with the right arm followed by the right leg.  The patient denied any facial droop or dysarthria or dysphagia.  He admitted to right-sided facial tightness.  He denied any focal muscle weakness.  No tinnitus or vertigo.  No urinary or stool incontinence.  No witnessed seizures.  He denied any chest pain or palpitations.  No cough or wheezing or dyspnea.  No bleeding diathesis.  No dysuria, oliguria or hematuria or flank pain.  He has not been taking his medications for a while including his blood pressure medications.  ED Course: When he came to the ER, BP was 218/132 and later 221/110 with otherwise normal vital signs.  Labs revealed blood glucose of 171 with otherwise unremarkable CMP.  CBC showed hemoglobin of 12.8 and hematocrit 39.  Alcohol level was less than 15.  Urine drug screen was negative. EKG as reviewed by me : EKG showed normal sinus rhythm with a rate of 73 with early repolarization in anteroseptal leads. Imaging: Noncontrasted head CT scan showed no acute intracranial normalities brain MRI without contrast showed small acute infarct in the posterior limb of the left internal capsule.  It showed mild bilateral sphenoid sinus disease. CTA of the head and neck revealed the following: CTA neck:   The common carotid, internal carotid and vertebral  arteries are patent within the neck without stenosis or significant atherosclerotic disease.   CTA head:   1. No proximal intracranial large vessel occlusion identified. 2. Moderate-to-severe stenosis within a right posterior cerebral artery branch at the P3 segment level. 3. Fenestration within the proximal basilar artery.  The patient was given 4 baby aspirin .  He will be admitted to the medical telemetry observation bed for further evaluation and management. PAST MEDICAL HISTORY:   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/26/2017   C2 cervical fracture (HCC) 01/03/2017   Closed fracture of right distal radius and ulna, initial encounter 01/03/2017   Diabetes mellitus without complication (HCC)    Gout    Herpes    Hypertension    Intraparenchymal hematoma of brain due to trauma (HCC) 01/03/2017   Non-compliant behavior    Open displaced segmental fracture of shaft of right tibia, type [name] 01/03/2017   Open right ankle fracture, type [name], initial encounter 01/03/2017   Quit smoking within past year    per mother   TBI (traumatic brain injury) (HCC) 01/29/2017    PAST SURGICAL HISTORY:   Past Surgical History:  Procedure Laterality Date   EXTERNAL FIXATION ARM Right 01/03/2017   Procedure: EXTERNAL FIXATION RIGHT WRIST;  Surgeon: Kendal Franky SQUIBB, MD;  Location: [facility] OR;  Service: Orthopedics;  Laterality: Right;   EXTERNAL FIXATION LEG Right 01/03/2017   Procedure: EXTERNAL FIXATION RIGHT ANKLE;  Surgeon: Kendal Franky SQUIBB, MD;  Location: [facility]  OR;  Service: Orthopedics;  Laterality: Right;   I & D EXTREMITY Right 01/03/2017   Procedure: IRRIGATION AND DEBRIDEMENT EXTREMITY;  Surgeon: Kendal Franky SQUIBB, MD;  Location: [facility] OR;  Service: Orthopedics;  Laterality: Right;   OPEN REDUCTION INTERNAL FIXATION (ORIF) DISTAL RADIAL FRACTURE Right 01/16/2017   Procedure: OPEN REDUCTION INTERNAL FIXATION (ORIF) DISTAL RADIAL FRACTURE;  Surgeon: Kendal Franky SQUIBB, MD;  Location: [facility] OR;  Service: Orthopedics;  Laterality: Right;   ORIF ANKLE FRACTURE Right 01/06/2017   Procedure: OPEN REDUCTION INTERNAL FIXATION (ORIF) ANKLE FRACTURE;  Surgeon: Kendal Franky SQUIBB, MD;  Location: [facility] OR;  Service: Orthopedics;  Laterality: Right;   TIBIA IM NAIL INSERTION Right 01/06/2017   Procedure: INTRAMEDULLARY (IM) NAIL TIBIAL;  Surgeon: Kendal Franky SQUIBB, MD;  Location: [facility] OR;  Service: Orthopedics;  Laterality: Right;    SOCIAL HISTORY:   Social History   Tobacco Use   Smoking status: Former    Types: Cigarettes   Smokeless tobacco: Never  Substance Use Topics   Alcohol use: Not Currently    FAMILY HISTORY:   Family History  Problem Relation Age of Onset   Diabetes Mother    Breast cancer Mother    Alcohol abuse Father     DRUG ALLERGIES:  No Known Allergies  REVIEW OF SYSTEMS:   ROS As per history of present illness. All pertinent systems were reviewed above. Constitutional, HEENT, cardiovascular, respiratory, GI, GU, musculoskeletal, neuro, psychiatric, endocrine, integumentary and hematologic systems were reviewed and are otherwise negative/unremarkable except for positive findings mentioned above in the HPI.   MEDICATIONS AT HOME:   Prior to Admission medications   Medication Sig Start Date End Date Taking? Authorizing Provider  aspirin  EC 325 MG EC tablet Take 1 tablet (325 mg total) by mouth daily. 02/19/17   Love, Sharlet RAMAN, PA-C  blood glucose meter kit and supplies KIT Dispense based on patient and insurance preference. Use up to four times daily as directed. (FOR ICD-9 250.00, 250.01). 02/13/17   Love, Sharlet RAMAN, PA-C  glipiZIDE  (GLUCOTROL ) 5 MG tablet Take 1 tablet (5 mg total) by mouth 2 (two) times daily before a meal. 02/18/17   Love, Sharlet RAMAN, PA-C  hydrochlorothiazide  (HYDRODIURIL ) 25 MG tablet Take 1 tablet (25 mg total) by mouth daily. 02/19/17   Love, Sharlet RAMAN, PA-C  lidocaine  (XYLOCAINE ) 2 % solution Use as directed 15 mLs  in the mouth or throat as needed for mouth pain. 06/22/20   Rodgers, Caitlin J, PA  lisinopril  (PRINIVIL ,ZESTRIL ) 20 MG tablet Take 1 tablet (20 mg total) by mouth daily. 02/19/17   Love, Sharlet RAMAN, PA-C  metFORMIN  (GLUCOPHAGE ) 500 MG tablet Take 500 mg by mouth 2 (two) times daily with a meal.    [provider]  metoprolol  tartrate (LOPRESSOR ) 100 MG tablet Take 1 tablet (100 mg total) by mouth 2 (two) times daily. 02/18/17   Love, Sharlet RAMAN, PA-C  simvastatin  (ZOCOR ) 10 MG tablet Take 1 tablet (10 mg total) by mouth daily at 6 PM. 02/18/17   Love, Sharlet RAMAN, PA-C      VITAL SIGNS:  Blood pressure (!) 177/109, pulse (!) 59, temperature 98.3 F (36.8 C), resp. rate 16, height 5' 6 (1.676 m), weight 99.9 kg, SpO2 99%.  PHYSICAL EXAMINATION:  Physical Exam  GENERAL:  43 y.o.-year-old African-American male patient lying in the bed with no acute distress.  EYES: Pupils equal, round, reactive to light and accommodation. No scleral icterus. Extraocular muscles intact.  HEENT: Head atraumatic, normocephalic.  Oropharynx and nasopharynx clear.  NECK:  Supple, no jugular venous distention. No thyroid enlargement, no tenderness.  LUNGS: Normal breath sounds bilaterally, no wheezing, rales,rhonchi or crepitation. No use of accessory muscles of respiration.  CARDIOVASCULAR: Regular rate and rhythm, S1, S2 normal. No murmurs, rubs, or gallops.  ABDOMEN: Soft, nondistended, nontender. Bowel sounds present. No organomegaly or mass.  EXTREMITIES: No pedal edema, cyanosis, or clubbing.  NEUROLOGIC: Cranial nerves II through XII are intact. Muscle strength 5/5 in all extremities.  Diminished sensation to light touch in the right upper and lower extremity.  Gait not checked.  PSYCHIATRIC: The patient is alert and oriented x 3.  Normal affect and good eye contact. SKIN: No obvious rash, lesion, or ulcer.   LABORATORY PANEL:   CBC Recent Labs  Lab 10/26/23 1850  WBC 6.0  HGB 12.8*  HCT 39.0  PLT  189   ------------------------------------------------------------------------------------------------------------------  Chemistries  Recent Labs  Lab 10/26/23 1850  NA 140  K 4.0  CL 107  CO2 25  GLUCOSE 171*  BUN 13  CREATININE 0.62  CALCIUM  9.4  AST 24  ALT 28  ALKPHOS 118  BILITOT 0.7   ------------------------------------------------------------------------------------------------------------------  Cardiac Enzymes No results for input(s): TROPONINI in the last 168 hours. ------------------------------------------------------------------------------------------------------------------  RADIOLOGY:  MR BRAIN WO CONTRAST Result Date: 10/26/2023 CLINICAL DATA:  Stroke, follow up EXAM: MRI HEAD WITHOUT CONTRAST TECHNIQUE: Multiplanar, multiecho pulse sequences of the brain and surrounding structures were obtained without intravenous contrast. COMPARISON:  Same day CT head. FINDINGS: Brain: Small acute infarct in the posterior limb of the left internal capsule. No substantial edema or mass effect. No evidence of acute hemorrhage, mass lesion, midline shift or hydrocephalus. Vascular: Major arterial flow voids are maintained at the skull base. Skull and upper cervical spine: Normal marrow signal. Sinuses/Orbits: Negative. Other: No mastoid effusions. IMPRESSION: Small acute infarct in the posterior limb of the left internal capsule. Electronically Signed   By: Gilmore GORMAN Molt M.D.   On: 10/26/2023 20:12   CT ANGIO HEAD NECK W WO CM (CODE STROKE) Result Date: 10/26/2023 CLINICAL DATA:  Provided history: Neuro deficit, acute, stroke suspected. Additional history obtained from electronic record: Dizziness, sensory changes in right arm and right side of face. EXAM: CT ANGIOGRAPHY HEAD AND NECK WITH AND WITHOUT CONTRAST TECHNIQUE: Multidetector CT imaging of the head and neck was performed using the standard protocol during bolus administration of intravenous contrast. Multiplanar CT  image reconstructions and MIPs were obtained to evaluate the vascular anatomy. Carotid stenosis measurements (when applicable) are obtained utilizing NASCET criteria, using the distal internal carotid diameter as the denominator. RADIATION DOSE REDUCTION: This exam was performed according to the departmental dose-optimization program which includes automated exposure control, adjustment of the mA and/or kV according to patient size and/or use of iterative reconstruction technique. CONTRAST:  75mL OMNIPAQUE  IOHEXOL  350 MG/ML SOLN COMPARISON:  Noncontrast head CT performed earlier today 10/26/2023. CT angiogram neck 01/03/2017. FINDINGS: CTA NECK FINDINGS Aortic arch: Standard aortic branching. Visible portions of the thoracic aorta upper normal in caliber. Streak/beam hardening artifact arising from a dense contrast bolus partially obscures the right subclavian artery. Within this limitation, there is no appreciable hemodynamically significant innominate or proximal subclavian artery stenosis. Right carotid system: CCA and ICA patent within the neck without stenosis or significant). Left carotid system: CCA and ICA patent within the neck without stenosis or significant atherosclerotic disease. Tortuosity of the cervical ICA. Vertebral arteries: Codominant and patent within the neck without stenosis or significant atherosclerotic disease.  Skeleton: Nonspecific reversal of the expected cervical doses. No acute fracture or aggressive osseous lesion. Other neck: No neck mass or cervical lymphadenopathy. Upper chest: No consolidation within the imaged lung apices. Review of the MIP images confirms the above findings CTA HEAD FINDINGS Anterior circulation: The intracranial internal carotid arteries are patent. The M1 middle cerebral arteries are patent. No M2 proximal branch occlusion or high-grade proximal stenosis. The anterior cerebral arteries are patent. Strongly dominant left anterior cerebral artery beginning at the  A2 segment level, likely contributing supply to both the left and right cerebral hemispheres. No intracranial aneurysm is identified. Posterior circulation: The intracranial vertebral arteries are patent. The basilar artery is patent. Fenestration within the proximal basilar artery. The posterior cerebral arteries are patent. Moderate-to-severe stenosis within a right PCA branch at the P3 segment level (series 11, image 34). Posterior communicating arteries are diminutive or absent, bilaterally. Venous sinuses: Within the limitations of contrast timing, no convincing thrombus. Anatomic variants: As described. Review of the MIP images confirms the above findings No emergent large vessel occlusion identified. These results were communicated to Dr. Michaela at 7:42 pmon 8/24/2025by text page via the [facility] messaging system. IMPRESSION: CTA neck: The common carotid, internal carotid and vertebral arteries are patent within the neck without stenosis or significant atherosclerotic disease. CTA head: 1. No proximal intracranial large vessel occlusion identified. 2. Moderate-to-severe stenosis within a right posterior cerebral artery branch at the P3 segment level. 3. Fenestration within the proximal basilar artery. Electronically Signed   By: Rockey Childs D.O.   On: 10/26/2023 19:43   CT HEAD CODE STROKE WO CONTRAST Result Date: 10/26/2023 CLINICAL DATA:  Code stroke. Neuro deficit, acute, stroke suspected. EXAM: CT HEAD WITHOUT CONTRAST TECHNIQUE: Contiguous axial images were obtained from the base of the skull through the vertex without intravenous contrast. RADIATION DOSE REDUCTION: This exam was performed according to the departmental dose-optimization program which includes automated exposure control, adjustment of the mA and/or kV according to patient size and/or use of iterative reconstruction technique. COMPARISON:  Head CT 01/04/2017. FINDINGS: Brain: No age-advanced or lobar predominant cerebral atrophy.  There is no acute intracranial hemorrhage. No acute demarcated cortical infarct. No extra-axial fluid collection. No evidence of an intracranial mass. No midline shift. Vascular: No hyperdense vessel. Skull: No calvarial fracture or aggressive osseous lesion. Sinuses/Orbits: No mass or acute finding within the imaged orbits. Small-volume secretions within the bilateral sphenoid sinuses. ASPECTS [facility] Stroke Program Early CT Score) - Ganglionic level infarction (caudate, lentiform nuclei, internal capsule, insula, M1-M3 cortex): 7 - Supraganglionic infarction (M4-M6 cortex): 3 Total score (0-10 with 10 being normal): 10 No evidence of an acute intracranial abnormality. These results were communicated to Dr. Voncile at 7:04 pmon 8/24/2025by text page via the [facility] messaging system. IMPRESSION: 1.  No evidence of an acute intracranial abnormality. 2. Mild bilateral sphenoid sinus disease. Electronically Signed   By: Rockey Childs D.O.   On: 10/26/2023 19:05      IMPRESSION AND PLAN:  Assessment and Plan: * Acute CVA (cerebrovascular accident) [facility]) - The patient will be admitted to a medical telemetry admission bed. 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 - It is manifested by the right sided paresthesias. - We will follow neuro checks q.4 hours for 24 hours.   - The patient will be placed on aspirin  and Plavix .   - Will obtain a 2D echo with bubble study .   - A neurology consultation  as well as physical/occupation/speech therapy consults will be obtained in a.m.SABRA - I notified Dr. Lindzen about the patient. - The patient will be placed on statin therapy and fasting lipids will be checked.   Hypertensive urgency - This is secondary to noncompliance. - The patient will be continued on his antihypertensive therapy with permissive parameters. - Will add as needed IV hydralazine .  Dyslipidemia - Will continue statin therapy.  Type 2 diabetes  mellitus without complications (HCC) - We will place the patient on supplemental coverage with NovoLog . - Will continue Glucotrol  and hold off metformin .   DVT prophylaxis: Lovenox .  Advanced Care Planning:  Code Status: full code.  Family Communication:  The plan of care was discussed in details with the patient (and family). I answered all questions. The patient agreed to proceed with the above mentioned plan. Further management will depend upon hospital course. Disposition Plan: Back to previous home environment Consults called: Neurology All the records are reviewed and case discussed with ED provider.  Status is: Observation  I certify that at the time of admission, it is my clinical judgment that the patient will require  hospital care extending less than 2 midnights.                            Dispo: The patient is from: Home              Anticipated d/c is to: Home              Patient currently is not medically stable to d/c.              Difficult to place patient: No  Madison DELENA Peaches M.D on 10/27/2023 at 12:16 AM  [facility]   From 7 PM-7 AM, contact night-coverage [email or web address]  CC: Primary care physician; [facility], [facility]

## 2023-10-26 NOTE — ED Notes (Signed)
Took pt to MRI 

## 2023-10-27 ENCOUNTER — Observation Stay
Admit: 2023-10-27 | Discharge: 2023-10-27 | Disposition: A | Discharge status: Home / Self Care | Attending: Family Medicine

## 2023-10-27 DIAGNOSIS — I160 Hypertensive urgency: Secondary | ICD-10-CM

## 2023-10-27 DIAGNOSIS — E119 Type 2 diabetes mellitus without complications: Secondary | ICD-10-CM

## 2023-10-27 DIAGNOSIS — I639 Cerebral infarction, unspecified: Secondary | ICD-10-CM | POA: Insufficient documentation

## 2023-10-27 DIAGNOSIS — E785 Hyperlipidemia, unspecified: Secondary | ICD-10-CM

## 2023-10-27 LAB — ECHOCARDIOGRAM COMPLETE BUBBLE STUDY
AR max vel: 2.81 cm2
AV Area VTI: 3.04 cm2
AV Area mean vel: 2.97 cm2
AV Mean grad: 1.5 mmHg
AV Peak grad: 2.8 mmHg
Ao pk vel: 0.84 m/s
Area-P 1/2: 2.64 cm2
MV VTI: 2.63 cm2
S' Lateral: 3.31 cm

## 2023-10-27 LAB — LIPID PANEL
Cholesterol: 197 mg/dL (ref 0–200)
HDL: 36 mg/dL — ABNORMAL LOW (ref >40)
LDL Cholesterol: 123 mg/dL — ABNORMAL HIGH (ref 0–99)
Total CHOL/HDL Ratio: 5.5 ratio
Triglycerides: 188 mg/dL — ABNORMAL HIGH (ref <150)
VLDL: 38 mg/dL (ref 0–40)

## 2023-10-27 LAB — HEMOGLOBIN A1C
Hgb A1c MFr Bld: 8.6 % — ABNORMAL HIGH (ref 4.8–5.6)
Mean Plasma Glucose: 200.12 mg/dL

## 2023-10-27 LAB — GLUCOSE, CAPILLARY
Glucose-Capillary: 185 mg/dL — ABNORMAL HIGH (ref 70–99)
Glucose-Capillary: 137 mg/dL — ABNORMAL HIGH (ref 70–99)
Glucose-Capillary: 253 mg/dL — ABNORMAL HIGH (ref 70–99)

## 2023-10-27 LAB — HIV ANTIBODY (ROUTINE TESTING W REFLEX): HIV Screen 4th Generation wRfx: NONREACTIVE

## 2023-10-27 MED ORDER — ATORVASTATIN CALCIUM 20 MG PO TABS
40.0000 mg | ORAL_TABLET | Freq: Every day | ORAL | Status: DC
  Administered 2023-10-27 – 2023-10-28 (×2): 40 mg via ORAL
  Filled 2023-10-27 (×2): qty 2

## 2023-10-27 MED ORDER — LISINOPRIL 20 MG PO TABS: 20.0000 mg | ORAL_TABLET | Freq: Every day | ORAL | Status: DC

## 2023-10-27 MED ORDER — INSULIN ASPART 100 UNIT/ML IJ SOLN
0.0000 [IU] | Freq: Three times a day (TID) | INTRAMUSCULAR | Status: DC
  Administered 2023-10-27: 1 [IU] via SUBCUTANEOUS
  Administered 2023-10-27: 2 [IU] via SUBCUTANEOUS
  Administered 2023-10-28: 1 [IU] via SUBCUTANEOUS
  Administered 2023-10-28: 2 [IU] via SUBCUTANEOUS
  Filled 2023-10-27 (×3): qty 1

## 2023-10-27 NOTE — Assessment & Plan Note (Signed)
 Will continue statin therapy

## 2023-10-27 NOTE — Plan of Care (Signed)
  Problem: Education: Goal: Knowledge of patient specific risk factors will improve (DELETE if not current risk factor) Outcome: Progressing   Problem: Ischemic Stroke/TIA Tissue Perfusion: Goal: Complications of ischemic stroke/TIA will be minimized Outcome: Progressing   Problem: Coping: Goal: Will verbalize positive feelings about self Outcome: Progressing   Problem: Health Behavior/Discharge Planning: Goal: Ability to manage health-related needs will improve Outcome: Progressing   Problem: Self-Care: Goal: Ability to communicate needs accurately will improve Outcome: Progressing

## 2023-10-27 NOTE — Evaluation (Signed)
 Physical Therapy Evaluation Patient Details Name: Mark Mcguire. MRN: [id number] DOB: December 04, 1980 Today's Date: 10/27/2023  History of Present Illness  43 y/o male presented to ED on 10/26/23 for dizziness and R sided numbness. MRI showed small acute infarct in L internal capsule. PMH: T2DM, HTN, TBI  Clinical Impression  Patient admitted with the above. PTA, patient lives with mother and was independent with no AD and working. Patient continues to function at independent level with no AD, however with decreased stance time on R. Continues to have R sided numbness but otherwise functioning near baseline. No further skilled PT needs identified acutely. No PT follow up recommended at this time.        Equipment Recommendations None recommended by PT  Functional Status Assessment Patient has not had a recent decline in their functional status     Precautions / Restrictions Precautions Precautions: None Recall of Precautions/Restrictions: Intact Restrictions Weight Bearing Restrictions Per Provider Order: No      Mobility  Bed Mobility Overal bed mobility: Independent      Transfers Overall transfer level: Independent Equipment used: None    Ambulation/Gait Ambulation/Gait assistance: Independent Gait Distance (Feet): 200 Feet Assistive device: None Gait Pattern/deviations: Step-through pattern, Decreased stride length Gait velocity: decreased     General Gait Details: decreased stance time on R but no overt LOB during ambulation     Balance Overall balance assessment: Mild deficits observed, not formally tested                                           Pertinent Vitals/Pain Pain Assessment Pain Assessment: No/denies pain    Home Living Family/patient expects to be discharged to:: Private residence Living Arrangements: Parent Available Help at Discharge: Family;Available 24 hours/day Type of Home: House Home Access: Ramped entrance        Home Layout: One level Home Equipment: Agricultural consultant (2 wheels)      Prior Function Prior Level of Function : Independent/Modified Independent;Driving;Working/employed                     Extremity/Trunk Assessment   Upper Extremity Assessment Upper Extremity Assessment: Overall WFL for tasks assessed (R UE numbness)    Lower Extremity Assessment Lower Extremity Assessment: Overall WFL for tasks assessed (R LE numbness)    Cervical / Trunk Assessment Cervical / Trunk Assessment: Normal  Communication   Communication Communication: No apparent difficulties    Cognition Arousal: Alert Behavior During Therapy: WFL for tasks assessed/performed   PT - Cognitive impairments: No apparent impairments                         Following commands: Intact       Cueing       General Comments      Exercises     Assessment/Plan    PT Assessment Patient does not need any further PT services  PT Problem List         PT Treatment Interventions      PT Goals (Current goals can be found in the Care Plan section)  Acute Rehab PT Goals Patient Stated Goal: to go back to work PT Goal Formulation: All assessment and education complete, DC therapy    Frequency       Co-evaluation  AM-PAC PT 6 Clicks Mobility  Outcome Measure Help needed turning from your back to your side while in a flat bed without using bedrails?: None Help needed moving from lying on your back to sitting on the side of a flat bed without using bedrails?: None Help needed moving to and from a bed to a chair (including a wheelchair)?: None Help needed standing up from a chair using your arms (e.g., wheelchair or bedside chair)?: None Help needed to walk in hospital room?: None Help needed climbing 3-5 steps with a railing? : None 6 Click Score: 24    End of Session   Activity Tolerance: Patient tolerated treatment well Patient left: in bed;with call  bell/phone within reach Nurse Communication: Mobility status PT Visit Diagnosis: Muscle weakness (generalized) (M62.81)    Time: 9066-9050 PT Time Calculation (min) (ACUTE ONLY): 16 min   Charges:   PT Evaluation $PT Eval Low Complexity: 1 Low   PT General Charges $$ ACUTE PT VISIT: 1 Visit         Maryanne Finder, PT, DPT Physical Therapist - [facility]  [facility]   Sajan Cheatwood A Furious Chiarelli 10/27/2023, 11:27 AM

## 2023-10-27 NOTE — Assessment & Plan Note (Signed)
-   This is secondary to noncompliance. - The patient will be continued on his antihypertensive therapy with permissive parameters. - Will add as needed IV hydralazine .

## 2023-10-27 NOTE — Progress Notes (Signed)
 OT Cancellation Note  Patient Details Name: Mark Mcguire. MRN: [id number] DOB: August 04, 1980   Cancelled Treatment:    Reason Eval/Treat Not Completed: OT screened, no needs identified, will sign off. Per PT, pt with no acute OT needs at this time. Will sign off, but can be re-consulted if needed.  [name] E Aerianna Losey 10/27/2023, 11:36 AM

## 2023-10-27 NOTE — Progress Notes (Signed)
 PROGRESS NOTE    Mark Mcguire.  FMW:[id number] DOB: 1980/06/13 DOA: 10/26/2023 PCP: [facility]  Chief Complaint  Patient presents with   Numbness    Hospital Course:  Mark Mcguire. is a 43 y.o. male with medical history significant for type 2 diabetes mellitus, hypertension and TBI, presented to ER with right-sided upper and lower extremity numbness and tingling, denies any focal weakness. Non compliant with medication Admitted for hypertensive urgency and acute CVA with infarct in the posterior limb of the left internal capsule. Hospital course as below    Subjective: Patient was examined at the bedside, new to me today.  Mother present at the bedside SBP 170's will gradually reduce after 24 hr window to allow permissive hypertension   Objective: Vitals:   10/26/23 2301 10/27/23 0006 10/27/23 0440 10/27/23 0800  BP:  (!) 177/109 (!) 165/98 (!) 175/98  Pulse:  (!) 59 (!) 59 (!) 56  Resp:    18  Temp:  98.3 F (36.8 C) 97.6 F (36.4 C) 97.9 F (36.6 C)  TempSrc:    Oral  SpO2:  99% 99% 99%  Weight: 99.9 kg     Height: 5' 6 (1.676 m)       Intake/Output Summary (Last 24 hours) at 10/27/2023 0836 Last data filed at 10/27/2023 9278 Gross per 24 hour  Intake 378.25 ml  Output --  Net 378.25 ml   Filed Weights   10/26/23 1911 10/26/23 2301  Weight: 100.1 kg 99.9 kg    Examination: GENERAL:  43 y.o.-year-old African-American male patient lying in the bed with no acute distress.  NECK:  Supple, no jugular venous distention. No thyroid enlargement, no tenderness.  LUNGS: Normal breath sounds bilaterally, no wheezing, rales,rhonchi or crepitation. No use of accessory muscles of respiration.  CARDIOVASCULAR: Regular rate and rhythm, S1, S2 normal. No murmurs, rubs, or gallops.  ABDOMEN: Soft, nondistended, nontender. Bowel sounds present. No organomegaly or mass.  EXTREMITIES: No pedal edema, cyanosis, or clubbing NEUROLOGIC: Cranial nerves II  through XII are intact. Muscle strength 5/5 in all extremities.  Diminished sensation to light touch in the right upper and lower extremity.  Gait not checked.  SKIN: No obvious rash, lesion, or ulcer  Assessment & Plan:  Principal Problem:   Acute CVA (cerebrovascular accident) (HCC) Active Problems:   Hypertensive urgency   Type 2 diabetes mellitus without complications (HCC)   Dyslipidemia  Acute CVA - Presented with right sided paresthesias - LDL 123, HbA1c 8.6 - CT head negative, CTA neck without stenosis.  CTA head moderate to severe stenosis within right posterior cerebral artery branch at P3.  Fenestration within proximal basilar artery - MRI small acute infarct in the posterior limb of the left internal capsule - Echo shows EF 55 to 60%.  No RWMA.  Bubble study negative. - Seen by Neurology, appreciate recs - On asa 81mg , palvix 75mg , high intensity statin - Allow permissive hypertension - Neurochecks q4, Telemetry monitoring - SLP no needs - PT/OT - no needs   Hypertensive urgency HTN - This is secondary to noncompliance. - Hold Home Lisinopril , hydrochlorothiazide , Metoprolol  - Resume meds tomorrow after 24 hr window - Allow permissive hypertension due to acute CVA   Dyslipidemia - change simvastatin  to atorvastatin    Type 2 diabetes mellitus without complications (HCC) - SSI, HbA1c 8.6 - Hold Glipizide , Metformin    DVT prophylaxis: Lovenox  SQ   Code Status: Full Code Disposition:  Home  Consultants:  Neurology  Procedures:  None  Antimicrobials:  Anti-infectives (From admission, onward)    None       Data Reviewed: I have personally reviewed following labs and imaging studies CBC: Recent Labs  Lab 10/26/23 1850  WBC 6.0  NEUTROABS 2.6  HGB 12.8*  HCT 39.0  MCV 91.3  PLT 189   Basic Metabolic Panel: Recent Labs  Lab 10/26/23 1850  NA 140  K 4.0  CL 107  CO2 25  GLUCOSE 171*  BUN 13  CREATININE 0.62  CALCIUM  9.4    GFR: Estimated Creatinine Clearance: 135.8 mL/min (by C-G formula based on SCr of 0.62 mg/dL). Liver Function Tests: Recent Labs  Lab 10/26/23 1850  AST 24  ALT 28  ALKPHOS 118  BILITOT 0.7  PROT 7.1  ALBUMIN 4.1   CBG: Recent Labs  Lab 10/26/23 1846  GLUCAP 173*    No results found for this or any previous visit (from the past 240 hours).   Radiology Studies: MR BRAIN WO CONTRAST Result Date: 10/26/2023 CLINICAL DATA:  Stroke, follow up EXAM: MRI HEAD WITHOUT CONTRAST TECHNIQUE: Multiplanar, multiecho pulse sequences of the brain and surrounding structures were obtained without intravenous contrast. COMPARISON:  Same day CT head. FINDINGS: Brain: Small acute infarct in the posterior limb of the left internal capsule. No substantial edema or mass effect. No evidence of acute hemorrhage, mass lesion, midline shift or hydrocephalus. Vascular: Major arterial flow voids are maintained at the skull base. Skull and upper cervical spine: Normal marrow signal. Sinuses/Orbits: Negative. Other: No mastoid effusions. IMPRESSION: Small acute infarct in the posterior limb of the left internal capsule. Electronically Signed   By: Gilmore GORMAN Molt M.D.   On: 10/26/2023 20:12   CT ANGIO HEAD NECK W WO CM (CODE STROKE) Result Date: 10/26/2023 CLINICAL DATA:  Provided history: Neuro deficit, acute, stroke suspected. Additional history obtained from electronic record: Dizziness, sensory changes in right arm and right side of face. EXAM: CT ANGIOGRAPHY HEAD AND NECK WITH AND WITHOUT CONTRAST TECHNIQUE: Multidetector CT imaging of the head and neck was performed using the standard protocol during bolus administration of intravenous contrast. Multiplanar CT image reconstructions and MIPs were obtained to evaluate the vascular anatomy. Carotid stenosis measurements (when applicable) are obtained utilizing NASCET criteria, using the distal internal carotid diameter as the denominator. RADIATION DOSE  REDUCTION: This exam was performed according to the departmental dose-optimization program which includes automated exposure control, adjustment of the mA and/or kV according to patient size and/or use of iterative reconstruction technique. CONTRAST:  75mL OMNIPAQUE  IOHEXOL  350 MG/ML SOLN COMPARISON:  Noncontrast head CT performed earlier today 10/26/2023. CT angiogram neck 01/03/2017. FINDINGS: CTA NECK FINDINGS Aortic arch: Standard aortic branching. Visible portions of the thoracic aorta upper normal in caliber. Streak/beam hardening artifact arising from a dense contrast bolus partially obscures the right subclavian artery. Within this limitation, there is no appreciable hemodynamically significant innominate or proximal subclavian artery stenosis. Right carotid system: CCA and ICA patent within the neck without stenosis or significant). Left carotid system: CCA and ICA patent within the neck without stenosis or significant atherosclerotic disease. Tortuosity of the cervical ICA. Vertebral arteries: Codominant and patent within the neck without stenosis or significant atherosclerotic disease. Skeleton: Nonspecific reversal of the expected cervical doses. No acute fracture or aggressive osseous lesion. Other neck: No neck mass or cervical lymphadenopathy. Upper chest: No consolidation within the imaged lung apices. Review of the MIP images confirms the above findings CTA HEAD FINDINGS Anterior circulation: The intracranial internal carotid arteries are patent. The  M1 middle cerebral arteries are patent. No M2 proximal branch occlusion or high-grade proximal stenosis. The anterior cerebral arteries are patent. Strongly dominant left anterior cerebral artery beginning at the A2 segment level, likely contributing supply to both the left and right cerebral hemispheres. No intracranial aneurysm is identified. Posterior circulation: The intracranial vertebral arteries are patent. The basilar artery is patent.  Fenestration within the proximal basilar artery. The posterior cerebral arteries are patent. Moderate-to-severe stenosis within a right PCA branch at the P3 segment level (series 11, image 34). Posterior communicating arteries are diminutive or absent, bilaterally. Venous sinuses: Within the limitations of contrast timing, no convincing thrombus. Anatomic variants: As described. Review of the MIP images confirms the above findings No emergent large vessel occlusion identified. These results were communicated to Dr. Michaela at 7:42 pmon 8/24/2025by text page via the [facility] messaging system. IMPRESSION: CTA neck: The common carotid, internal carotid and vertebral arteries are patent within the neck without stenosis or significant atherosclerotic disease. CTA head: 1. No proximal intracranial large vessel occlusion identified. 2. Moderate-to-severe stenosis within a right posterior cerebral artery branch at the P3 segment level. 3. Fenestration within the proximal basilar artery. Electronically Signed   By: Rockey Childs D.O.   On: 10/26/2023 19:43   CT HEAD CODE STROKE WO CONTRAST Result Date: 10/26/2023 CLINICAL DATA:  Code stroke. Neuro deficit, acute, stroke suspected. EXAM: CT HEAD WITHOUT CONTRAST TECHNIQUE: Contiguous axial images were obtained from the base of the skull through the vertex without intravenous contrast. RADIATION DOSE REDUCTION: This exam was performed according to the departmental dose-optimization program which includes automated exposure control, adjustment of the mA and/or kV according to patient size and/or use of iterative reconstruction technique. COMPARISON:  Head CT 01/04/2017. FINDINGS: Brain: No age-advanced or lobar predominant cerebral atrophy. There is no acute intracranial hemorrhage. No acute demarcated cortical infarct. No extra-axial fluid collection. No evidence of an intracranial mass. No midline shift. Vascular: No hyperdense vessel. Skull: No calvarial fracture or  aggressive osseous lesion. Sinuses/Orbits: No mass or acute finding within the imaged orbits. Small-volume secretions within the bilateral sphenoid sinuses. ASPECTS [facility] Stroke Program Early CT Score) - Ganglionic level infarction (caudate, lentiform nuclei, internal capsule, insula, M1-M3 cortex): 7 - Supraganglionic infarction (M4-M6 cortex): 3 Total score (0-10 with 10 being normal): 10 No evidence of an acute intracranial abnormality. These results were communicated to Dr. Voncile at 7:04 pmon 8/24/2025by text page via the [facility] messaging system. IMPRESSION: 1.  No evidence of an acute intracranial abnormality. 2. Mild bilateral sphenoid sinus disease. Electronically Signed   By: Rockey Childs D.O.   On: 10/26/2023 19:05    Scheduled Meds:  aspirin  EC  81 mg Oral Daily   clopidogrel   75 mg Oral Daily   enoxaparin  (LOVENOX ) injection  0.5 mg/kg Subcutaneous Q24H   glipiZIDE   5 mg Oral BID AC   hydrochlorothiazide   25 mg Oral Daily   lisinopril   20 mg Oral Daily   metoprolol  tartrate  100 mg Oral BID   pneumococcal 20-valent conjugate vaccine  0.5 mL Intramuscular Tomorrow-1000   simvastatin   10 mg Oral q1800   Continuous Infusions:  sodium chloride  40 mL/hr at 10/27/23 0721     LOS: 0 days  MDM: Patient is high risk for one or more organ failure.  They necessitate ongoing hospitalization for continued IV therapies and subsequent lab monitoring. Total time spent interpreting labs and vitals, reviewing the medical record, coordinating care amongst consultants and care team members, directly assessing and  discussing care with the patient and/or family: 55 min Laree Lock, MD [facility]  To contact the attending physician between 7A-7P please use Epic Chat. To contact the covering physician during after hours 7P-7A, please review Amion.  10/27/2023, 8:36 AM   *This document has been created with the assistance of dictation software. Please excuse typographical errors. *
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
## 2023-10-27 NOTE — Progress Notes (Signed)
*  PRELIMINARY RESULTS* Echocardiogram 2D Echocardiogram has been performed.  Floydene Harder 10/27/2023, 9:03 AM
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
## 2023-10-27 NOTE — Assessment & Plan Note (Signed)
-   We will place the patient on supplemental coverage with NovoLog . - Will continue Glucotrol  and hold off metformin .

## 2023-10-27 NOTE — H&P (Incomplete)
 [place]   PATIENT NAME: Mark Mcguire    MR#:  [id number]  DATE OF BIRTH:  01-May-1980  DATE OF ADMISSION:  10/26/2023  PRIMARY CARE PHYSICIAN: [facility], [facility]   Patient is coming from: Home  REQUESTING/REFERRING PHYSICIAN: Viviann Mungo, MD  CHIEF COMPLAINT:   Chief Complaint  Patient presents with  . Numbness    HISTORY OF PRESENT ILLNESS:  Mark Mcguire. is a 43 y.o. male with medical history significant for type 2 diabetes mellitus, hypertension and TBI, who presented to the emergency room with the onset of right-sided upper and lower extremity numbness and tingling that started around 5:45 PM with the right arm followed by the right leg.  The patient denied any facial droop or dysarthria or dysphagia.  He admitted to right-sided facial tightness.  He denied any focal muscle weakness.  No tinnitus or vertigo.  No urinary or stool incontinence.  No witnessed seizures.  He denied any chest pain or palpitations.  No cough or wheezing or dyspnea.  No bleeding diathesis.  No dysuria, oliguria or hematuria or flank pain.  He has not been taking his medications for a while including his blood pressure medications.  ED Course: When he came to the ER, BP was 218/132 and later 221/110 with otherwise normal vital signs.  Labs revealed blood glucose of 171 with otherwise unremarkable CMP.  CBC showed hemoglobin of 12.8 and hematocrit 39.  Alcohol level was less than 15.  Urine drug screen was negative. EKG as reviewed by me : *** Imaging: Noncontrasted head CT scan showed no acute intracranial normalities brain MRI without contrast showed small acute infarct in the posterior limb of the left internal capsule.  It showed mild bilateral sphenoid sinus disease. PAST MEDICAL HISTORY:   Past Medical History:  Diagnosis Date  . Adhesive capsulitis of right shoulder 11/26/2017  . C2 cervical fracture (HCC) 01/03/2017  . Closed fracture of right distal radius and  ulna, initial encounter 01/03/2017  . Diabetes mellitus without complication (HCC)   . Gout   . Herpes   . Hypertension   . Intraparenchymal hematoma of brain due to trauma (HCC) 01/03/2017  . Non-compliant behavior   . Open displaced segmental fracture of shaft of right tibia, type [name] 01/03/2017  . Open right ankle fracture, type [name], initial encounter 01/03/2017  . Quit smoking within past year    per mother  . TBI (traumatic brain injury) (HCC) 01/29/2017    PAST SURGICAL HISTORY:   Past Surgical History:  Procedure Laterality Date  . EXTERNAL FIXATION ARM Right 01/03/2017   Procedure: EXTERNAL FIXATION RIGHT WRIST;  Surgeon: Kendal Franky SQUIBB, MD;  Location: [facility] OR;  Service: Orthopedics;  Laterality: Right;  . EXTERNAL FIXATION LEG Right 01/03/2017   Procedure: EXTERNAL FIXATION RIGHT ANKLE;  Surgeon: Kendal Franky SQUIBB, MD;  Location: [facility] OR;  Service: Orthopedics;  Laterality: Right;  . I & D EXTREMITY Right 01/03/2017   Procedure: IRRIGATION AND DEBRIDEMENT EXTREMITY;  Surgeon: Kendal Franky SQUIBB, MD;  Location: [facility] OR;  Service: Orthopedics;  Laterality: Right;  . OPEN REDUCTION INTERNAL FIXATION (ORIF) DISTAL RADIAL FRACTURE Right 01/16/2017   Procedure: OPEN REDUCTION INTERNAL FIXATION (ORIF) DISTAL RADIAL FRACTURE;  Surgeon: Kendal Franky SQUIBB, MD;  Location: [facility] OR;  Service: Orthopedics;  Laterality: Right;  . ORIF ANKLE FRACTURE Right 01/06/2017   Procedure: OPEN REDUCTION INTERNAL FIXATION (ORIF) ANKLE FRACTURE;  Surgeon: Kendal Franky SQUIBB, MD;  Location: [facility] OR;  Service: Orthopedics;  Laterality: Right;  . TIBIA IM NAIL INSERTION Right 01/06/2017   Procedure: INTRAMEDULLARY (IM) NAIL TIBIAL;  Surgeon: Kendal Franky SQUIBB, MD;  Location: [facility] OR;  Service: Orthopedics;  Laterality: Right;    SOCIAL HISTORY:   Social History   Tobacco Use  . Smoking status: Former    Types: Cigarettes  . Smokeless tobacco: Never  Substance Use Topics  . Alcohol use: Not Currently    FAMILY HISTORY:    Family History  Problem Relation Age of Onset  . Diabetes Mother   . Breast cancer Mother   . Alcohol abuse Father     DRUG ALLERGIES:  No Known Allergies  REVIEW OF SYSTEMS:   ROS As per history of present illness. All pertinent systems were reviewed above. Constitutional, HEENT, cardiovascular, respiratory, GI, GU, musculoskeletal, neuro, psychiatric, endocrine, integumentary and hematologic systems were reviewed and are otherwise negative/unremarkable except for positive findings mentioned above in the HPI.   MEDICATIONS AT HOME:   Prior to Admission medications   Medication Sig Start Date End Date Taking? Authorizing Provider  aspirin  EC 325 MG EC tablet Take 1 tablet (325 mg total) by mouth daily. 02/19/17   Love, Sharlet RAMAN, PA-C  blood glucose meter kit and supplies KIT Dispense based on patient and insurance preference. Use up to four times daily as directed. (FOR ICD-9 250.00, 250.01). 02/13/17   Love, Sharlet RAMAN, PA-C  glipiZIDE  (GLUCOTROL ) 5 MG tablet Take 1 tablet (5 mg total) by mouth 2 (two) times daily before a meal. 02/18/17   Love, Sharlet RAMAN, PA-C  hydrochlorothiazide  (HYDRODIURIL ) 25 MG tablet Take 1 tablet (25 mg total) by mouth daily. 02/19/17   Love, Sharlet RAMAN, PA-C  lidocaine  (XYLOCAINE ) 2 % solution Use as directed 15 mLs in the mouth or throat as needed for mouth pain. 06/22/20   Rodgers, Caitlin J, PA  lisinopril  (PRINIVIL ,ZESTRIL ) 20 MG tablet Take 1 tablet (20 mg total) by mouth daily. 02/19/17   Love, Sharlet RAMAN, PA-C  metFORMIN  (GLUCOPHAGE ) 500 MG tablet Take 500 mg by mouth 2 (two) times daily with a meal.    [provider]  metoprolol  tartrate (LOPRESSOR ) 100 MG tablet Take 1 tablet (100 mg total) by mouth 2 (two) times daily. 02/18/17   Love, Sharlet RAMAN, PA-C  simvastatin  (ZOCOR ) 10 MG tablet Take 1 tablet (10 mg total) by mouth daily at 6 PM. 02/18/17   Love, Sharlet RAMAN, PA-C      VITAL SIGNS:  Blood pressure (!) 178/102, pulse 66, temperature 97.8  F (36.6 C), temperature source Oral, resp. rate 16, height 5' 6 (1.676 m), weight 100.1 kg, SpO2 100%.  PHYSICAL EXAMINATION:  Physical Exam  GENERAL:  43 y.o.-year-old patient lying in the bed with no acute distress.  EYES: Pupils equal, round, reactive to light and accommodation. No scleral icterus. Extraocular muscles intact.  HEENT: Head atraumatic, normocephalic. Oropharynx and nasopharynx clear.  NECK:  Supple, no jugular venous distention. No thyroid enlargement, no tenderness.  LUNGS: Normal breath sounds bilaterally, no wheezing, rales,rhonchi or crepitation. No use of accessory muscles of respiration.  CARDIOVASCULAR: Regular rate and rhythm, S1, S2 normal. No murmurs, rubs, or gallops.  ABDOMEN: Soft, nondistended, nontender. Bowel sounds present. No organomegaly or mass.  EXTREMITIES: No pedal edema, cyanosis, or clubbing.  NEUROLOGIC: Cranial nerves II through XII are intact. Muscle strength 5/5 in all extremities. Sensation intact. Gait not checked.  PSYCHIATRIC: The patient is alert and oriented x 3.  Normal affect and good eye contact. SKIN: No  obvious rash, lesion, or ulcer.   LABORATORY PANEL:   CBC Recent Labs  Lab 10/26/23 1850  WBC 6.0  HGB 12.8*  HCT 39.0  PLT 189   ------------------------------------------------------------------------------------------------------------------  Chemistries  Recent Labs  Lab 10/26/23 1850  NA 140  K 4.0  CL 107  CO2 25  GLUCOSE 171*  BUN 13  CREATININE 0.62  CALCIUM  9.4  AST 24  ALT 28  ALKPHOS 118  BILITOT 0.7   ------------------------------------------------------------------------------------------------------------------  Cardiac Enzymes No results for input(s): TROPONINI in the last 168 hours. ------------------------------------------------------------------------------------------------------------------  RADIOLOGY:  MR BRAIN WO CONTRAST Result Date: 10/26/2023 CLINICAL DATA:  Stroke, follow  up EXAM: MRI HEAD WITHOUT CONTRAST TECHNIQUE: Multiplanar, multiecho pulse sequences of the brain and surrounding structures were obtained without intravenous contrast. COMPARISON:  Same day CT head. FINDINGS: Brain: Small acute infarct in the posterior limb of the left internal capsule. No substantial edema or mass effect. No evidence of acute hemorrhage, mass lesion, midline shift or hydrocephalus. Vascular: Major arterial flow voids are maintained at the skull base. Skull and upper cervical spine: Normal marrow signal. Sinuses/Orbits: Negative. Other: No mastoid effusions. IMPRESSION: Small acute infarct in the posterior limb of the left internal capsule. Electronically Signed   By: Gilmore GORMAN Molt M.D.   On: 10/26/2023 20:12   CT ANGIO HEAD NECK W WO CM (CODE STROKE) Result Date: 10/26/2023 CLINICAL DATA:  Provided history: Neuro deficit, acute, stroke suspected. Additional history obtained from electronic record: Dizziness, sensory changes in right arm and right side of face. EXAM: CT ANGIOGRAPHY HEAD AND NECK WITH AND WITHOUT CONTRAST TECHNIQUE: Multidetector CT imaging of the head and neck was performed using the standard protocol during bolus administration of intravenous contrast. Multiplanar CT image reconstructions and MIPs were obtained to evaluate the vascular anatomy. Carotid stenosis measurements (when applicable) are obtained utilizing NASCET criteria, using the distal internal carotid diameter as the denominator. RADIATION DOSE REDUCTION: This exam was performed according to the departmental dose-optimization program which includes automated exposure control, adjustment of the mA and/or kV according to patient size and/or use of iterative reconstruction technique. CONTRAST:  75mL OMNIPAQUE  IOHEXOL  350 MG/ML SOLN COMPARISON:  Noncontrast head CT performed earlier today 10/26/2023. CT angiogram neck 01/03/2017. FINDINGS: CTA NECK FINDINGS Aortic arch: Standard aortic branching. Visible portions  of the thoracic aorta upper normal in caliber. Streak/beam hardening artifact arising from a dense contrast bolus partially obscures the right subclavian artery. Within this limitation, there is no appreciable hemodynamically significant innominate or proximal subclavian artery stenosis. Right carotid system: CCA and ICA patent within the neck without stenosis or significant). Left carotid system: CCA and ICA patent within the neck without stenosis or significant atherosclerotic disease. Tortuosity of the cervical ICA. Vertebral arteries: Codominant and patent within the neck without stenosis or significant atherosclerotic disease. Skeleton: Nonspecific reversal of the expected cervical doses. No acute fracture or aggressive osseous lesion. Other neck: No neck mass or cervical lymphadenopathy. Upper chest: No consolidation within the imaged lung apices. Review of the MIP images confirms the above findings CTA HEAD FINDINGS Anterior circulation: The intracranial internal carotid arteries are patent. The M1 middle cerebral arteries are patent. No M2 proximal branch occlusion or high-grade proximal stenosis. The anterior cerebral arteries are patent. Strongly dominant left anterior cerebral artery beginning at the A2 segment level, likely contributing supply to both the left and right cerebral hemispheres. No intracranial aneurysm is identified. Posterior circulation: The intracranial vertebral arteries are patent. The basilar artery is patent. Fenestration within the proximal  basilar artery. The posterior cerebral arteries are patent. Moderate-to-severe stenosis within a right PCA branch at the P3 segment level (series 11, image 34). Posterior communicating arteries are diminutive or absent, bilaterally. Venous sinuses: Within the limitations of contrast timing, no convincing thrombus. Anatomic variants: As described. Review of the MIP images confirms the above findings No emergent large vessel occlusion identified.  These results were communicated to Dr. Michaela at 7:42 pmon 8/24/2025by text page via the [facility] messaging system. IMPRESSION: CTA neck: The common carotid, internal carotid and vertebral arteries are patent within the neck without stenosis or significant atherosclerotic disease. CTA head: 1. No proximal intracranial large vessel occlusion identified. 2. Moderate-to-severe stenosis within a right posterior cerebral artery branch at the P3 segment level. 3. Fenestration within the proximal basilar artery. Electronically Signed   By: Rockey Childs D.O.   On: 10/26/2023 19:43   CT HEAD CODE STROKE WO CONTRAST Result Date: 10/26/2023 CLINICAL DATA:  Code stroke. Neuro deficit, acute, stroke suspected. EXAM: CT HEAD WITHOUT CONTRAST TECHNIQUE: Contiguous axial images were obtained from the base of the skull through the vertex without intravenous contrast. RADIATION DOSE REDUCTION: This exam was performed according to the departmental dose-optimization program which includes automated exposure control, adjustment of the mA and/or kV according to patient size and/or use of iterative reconstruction technique. COMPARISON:  Head CT 01/04/2017. FINDINGS: Brain: No age-advanced or lobar predominant cerebral atrophy. There is no acute intracranial hemorrhage. No acute demarcated cortical infarct. No extra-axial fluid collection. No evidence of an intracranial mass. No midline shift. Vascular: No hyperdense vessel. Skull: No calvarial fracture or aggressive osseous lesion. Sinuses/Orbits: No mass or acute finding within the imaged orbits. Small-volume secretions within the bilateral sphenoid sinuses. ASPECTS [facility] Stroke Program Early CT Score) - Ganglionic level infarction (caudate, lentiform nuclei, internal capsule, insula, M1-M3 cortex): 7 - Supraganglionic infarction (M4-M6 cortex): 3 Total score (0-10 with 10 being normal): 10 No evidence of an acute intracranial abnormality. These results were communicated to Dr.  Voncile at 7:04 pmon 8/24/2025by text page via the [facility] messaging system. IMPRESSION: 1.  No evidence of an acute intracranial abnormality. 2. Mild bilateral sphenoid sinus disease. Electronically Signed   By: Rockey Childs D.O.   On: 10/26/2023 19:05      IMPRESSION AND PLAN:  Assessment and Plan: No notes have been filed under this hospital service. Service: Hospitalist      DVT prophylaxis: Lovenox ***  Advanced Care Planning:  Code Status: full code***  Family Communication:  The plan of care was discussed in details with the patient (and family). I answered all questions. The patient agreed to proceed with the above mentioned plan. Further management will depend upon hospital course. Disposition Plan: Back to previous home environment Consults called: none***  All the records are reviewed and case discussed with ED provider.  Status is: Observation {Observation:23811}   At the time of the admission, it appears that the appropriate admission status for this patient is inpatient.  This is judged to be reasonable and necessary in order to provide the required intensity of service to ensure the patient's safety given the presenting symptoms, physical exam findings and initial radiographic and laboratory data in the context of comorbid conditions.  The patient requires inpatient status due to high intensity of service, high risk of further deterioration and high frequency of surveillance required.  I certify that at the time of admission, it is my clinical judgment that the patient will require inpatient hospital care extending more than 2  midnights.                            Dispo: The patient is from: Home              Anticipated d/c is to: Home              Patient currently is not medically stable to d/c.              Difficult to place patient: No  Madison DELENA Peaches M.D on 10/26/2023 at 8:54 PM  [facility]   From 7 PM-7 AM, contact night-coverage [email or web address]  CC:  Primary care physician; [facility]

## 2023-10-27 NOTE — Assessment & Plan Note (Addendum)
-   The patient will be admitted to a medical telemetry admission bed. 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 - It is manifested by the right sided paresthesias. - We will follow neuro checks q.4 hours for 24 hours.   - The patient will be placed on aspirin  and Plavix .   - Will obtain a 2D echo with bubble study .   - A neurology consultation  as well as physical/occupation/speech therapy consults will be obtained in a.m.SABRA - I notified Dr. Lindzen about the patient. - The patient will be placed on statin therapy and fasting lipids will be checked.

## 2023-10-28 ENCOUNTER — Other Ambulatory Visit: Payer: Self-pay

## 2023-10-28 DIAGNOSIS — I639 Cerebral infarction, unspecified: Secondary | ICD-10-CM | POA: Diagnosis not present

## 2023-10-28 LAB — CBC
HCT: 38.3 % — ABNORMAL LOW (ref 39.0–52.0)
Hemoglobin: 12.8 g/dL — ABNORMAL LOW (ref 13.0–17.0)
MCH: 30.2 pg (ref 26.0–34.0)
MCHC: 33.4 g/dL (ref 30.0–36.0)
MCV: 90.3 fL (ref 80.0–100.0)
Platelets: 172 K/uL (ref 150–400)
RBC: 4.24 MIL/uL (ref 4.22–5.81)
RDW: 12.3 % (ref 11.5–15.5)
WBC: 5.8 K/uL (ref 4.0–10.5)
nRBC: 0 % (ref 0.0–0.2)

## 2023-10-28 LAB — BASIC METABOLIC PANEL WITH GFR
Anion gap: 7 (ref 5–15)
BUN: 15 mg/dL (ref 6–20)
CO2: 27 mmol/L (ref 22–32)
Calcium: 9.1 mg/dL (ref 8.9–10.3)
Chloride: 106 mmol/L (ref 98–111)
Creatinine, Ser: 0.82 mg/dL (ref 0.61–1.24)
GFR, Estimated: >60 mL/min (ref >60)
Glucose, Bld: 197 mg/dL — ABNORMAL HIGH (ref 70–99)
Potassium: 4.2 mmol/L (ref 3.5–5.1)
Sodium: 140 mmol/L (ref 135–145)

## 2023-10-28 LAB — GLUCOSE, CAPILLARY
Glucose-Capillary: 177 mg/dL — ABNORMAL HIGH (ref 70–99)
Glucose-Capillary: 133 mg/dL — ABNORMAL HIGH (ref 70–99)

## 2023-10-28 MED ORDER — GLIPIZIDE 5 MG PO TABS
5.0000 mg | ORAL_TABLET | Freq: Two times a day (BID) | ORAL | 3 refills | Status: AC
  Filled 2023-10-28: qty 60, 30d supply, fill #0

## 2023-10-28 MED ORDER — CLOPIDOGREL BISULFATE 75 MG PO TABS
75.0000 mg | ORAL_TABLET | Freq: Every day | ORAL | 0 refills | Status: AC
  Filled 2023-10-28: qty 30, 30d supply, fill #0

## 2023-10-28 MED ORDER — LISINOPRIL-HYDROCHLOROTHIAZIDE 20-12.5 MG PO TABS
1.0000 | ORAL_TABLET | Freq: Every day | ORAL | 3 refills | Status: AC
  Filled 2023-10-28: qty 30, 30d supply, fill #0

## 2023-10-28 MED ORDER — ATORVASTATIN CALCIUM 40 MG PO TABS
40.0000 mg | ORAL_TABLET | Freq: Every day | ORAL | 3 refills | Status: AC
  Filled 2023-10-28: qty 30, 30d supply, fill #0

## 2023-10-28 MED ORDER — ASPIRIN 81 MG PO TBEC
81.0000 mg | DELAYED_RELEASE_TABLET | Freq: Every day | ORAL | 3 refills | Status: AC
  Filled 2023-10-28: qty 30, 30d supply, fill #0

## 2023-10-28 MED ORDER — METFORMIN HCL 500 MG PO TABS
500.0000 mg | ORAL_TABLET | Freq: Two times a day (BID) | ORAL | 3 refills | Status: AC
  Filled 2023-10-28: qty 60, 30d supply, fill #0

## 2023-10-28 NOTE — Plan of Care (Signed)
  Problem: Education: Goal: Knowledge of patient specific risk factors will improve (DELETE if not current risk factor) Outcome: Progressing   Problem: Coping: Goal: Will identify appropriate support needs Outcome: Progressing   Problem: Self-Care: Goal: Ability to communicate needs accurately will improve Outcome: Progressing   Problem: Education: Goal: Knowledge of patient specific risk factors will improve (DELETE if not current risk factor) Outcome: Progressing

## 2023-10-28 NOTE — Progress Notes (Signed)
 Mobility Specialist - Progress Note   10/28/23 1141  Mobility  Activity Ambulated independently  Level of Assistance Modified independent, requires aide device or extra time  Assistive Device None  Distance Ambulated (ft) 600 ft  Range of Motion/Exercises Active  Activity Response Tolerated well  Mobility Referral Yes  Mobility visit 1 Mobility  Mobility Specialist Start Time (ACUTE ONLY) 1126  Mobility Specialist Stop Time (ACUTE ONLY) 1141  Mobility Specialist Time Calculation (min) (ACUTE ONLY) 15 min   Pt resting in bed on RA upon entry. Pt STS and ambulates to hallway around NS and out to medical mall ModI with no AD  (Pt required extra time due to weakness in right side during ambulation.) Pt endorses continuous numbness on right side. Pt returned to bed and left with needs in reach.   Guido Rumble Mobility Specialist 10/28/23, 11:53 AM

## 2023-10-28 NOTE — Discharge Summary (Signed)
 Physician Discharge Summary   Patient: Mark Mcguire. MRN: [id number] DOB: 1980-11-01  Admit date:     10/26/2023  Discharge date: 10/28/23  Discharge Physician: Laree Lock   PCP: [facility]   Recommendations at discharge:   Follow up with PCP within 1 week - Monitor BP/HR and titrate meds as needed DM management  Discharge Diagnoses: Principal Problem:   Acute CVA (cerebrovascular accident) [facility]) Active Problems:   Hypertensive urgency   Type 2 diabetes mellitus without complications (HCC)   Dyslipidemia  Resolved Problems:   * No resolved hospital problems. *  Hospital Course: [name] is a 43 y.o. male with medical history significant for type 2 diabetes mellitus, hypertension and TBI, presented to ER with right-sided upper and lower extremity numbness and tingling, denies any focal weakness. Non compliant with medication Admitted for hypertensive urgency and acute CVA with infarct in the posterior limb of the left internal capsule. Hospital course as below  Acute CVA - Presented with right sided paresthesias in both RUE and RLE - LDL 123, HbA1c 8.6 - CT head negative, CTA neck without stenosis.  CTA head moderate to severe stenosis within right posterior cerebral artery branch at P3.  Fenestration within proximal basilar artery - MRI small acute infarct in the posterior limb of the left internal capsule - Echo shows EF 55 to 60%.  No RWMA.  Bubble study negative. - Seen by Neurology, - On asa 81 mg indefinitely, palvix 75 mg 90 days, high intensity statin changed simvastatin  to atorvastatin  - Allow permissive hypertension for upto 48 hrs - No indication for Zio patch, discussed with Neuro - SLP no needs, PT/OT - no needs   Hypertensive urgency HTN - secondary to noncompliance - Metoprolol  held until OP follow up, resume as tolerated - Resume meds tomorrow - Lisinopril  20-hydrochlorothiazide  12.5  - Allow permissive hypertension  due to acute CVA for upto 48 hrs   Dyslipidemia - change simvastatin  to atorvastatin    Type 2 diabetes mellitus without complications (HCC) - HbA1c 8.6 - Resume Glipizide , Metformin   Obesity - Weight loss and lifestyle modification  -- Counseled patient regarding medication compliance and the importance of taking medication regularly. Meds to bed arranged   Consultants: Neurology Procedures performed: None  Disposition: Home Diet recommendation:  Carb modified diet DISCHARGE MEDICATION: Allergies as of 10/28/2023   No Known Allergies      Medication List     PAUSE taking these medications    metoprolol  tartrate 100 MG tablet Wait to take this until your doctor or other care provider tells you to start again. Commonly known as: LOPRESSOR  Take 1 tablet (100 mg total) by mouth 2 (two) times daily.       STOP taking these medications    hydrochlorothiazide  25 MG tablet Commonly known as: HYDRODIURIL    lisinopril  20 MG tablet Commonly known as: ZESTRIL    simvastatin  10 MG tablet Commonly known as: ZOCOR        TAKE these medications    aspirin  EC 81 MG tablet Take 1 tablet (81 mg total) by mouth daily. Swallow whole. Start taking on: October 29, 2023 What changed:  medication strength how much to take additional instructions   atorvastatin  40 MG tablet Commonly known as: LIPITOR Take 1 tablet (40 mg total) by mouth daily. Start taking on: October 29, 2023   blood glucose meter kit and supplies Kit Dispense based on patient and insurance preference. Use up to four times daily as directed. (FOR ICD-9  250.00, 250.01).   clopidogrel  75 MG tablet Commonly known as: PLAVIX  Take 1 tablet (75 mg total) by mouth daily.   glipiZIDE  5 MG tablet Commonly known as: GLUCOTROL  Take 1 tablet (5 mg total) by mouth 2 (two) times daily before a meal.   lidocaine  2 % solution Commonly known as: XYLOCAINE  Use as directed 15 mLs in the mouth or throat as needed for  mouth pain.   lisinopril -hydrochlorothiazide  20-12.5 MG tablet Commonly known as: Zestoretic  Take 1 tablet by mouth daily. Start taking on: October 29, 2023   metFORMIN  500 MG tablet Commonly known as: GLUCOPHAGE  Take 1 tablet (500 mg total) by mouth 2 (two) times daily with a meal.        Follow-up Information     [facility] Follow up.   Specialty: General Practice Why: hospital follow up Contact information: [address] [place] [address] [phone]                Discharge Exam: Fredricka Weights   10/26/23 1911 10/26/23 2301  Weight: 100.1 kg 99.9 kg   GENERAL:  43 y.o.-year-old African-American male patient lying in the bed with no acute distress.  NECK:  Supple, no jugular venous distention. No thyroid enlargement, no tenderness.  LUNGS: Normal breath sounds bilaterally, no wheezing, rales,rhonchi or crepitation. No use of accessory muscles of respiration.  CARDIOVASCULAR: Regular rate and rhythm, S1, S2 normal. No murmurs, rubs, or gallops.  ABDOMEN: Soft, nondistended, nontender. Bowel sounds present. No organomegaly or mass.  EXTREMITIES: No pedal edema, cyanosis, or clubbing NEUROLOGIC: Cranial nerves II through XII are intact. Muscle strength 5/5 in all extremities.  Diminished sensation to light touch in the right upper and lower extremity SKIN: No obvious rash, lesion, or ulcer  Condition at discharge: good  The results of significant diagnostics from this hospitalization (including imaging, microbiology, ancillary and laboratory) are listed below for reference.   Imaging Studies: ECHOCARDIOGRAM COMPLETE BUBBLE STUDY Result Date: 10/27/2023    ECHOCARDIOGRAM REPORT   Patient Name:   Mark Mcguire. Date of Exam: 10/27/2023 Medical Rec #:  [id number]          Height:       66.0 in Accession #:    [id number]         Weight:       220.2 lb Date of Birth:  Feb 27, 1981          BSA:          2.083 m Patient Age:    42 years            BP:           165/98 mmHg Patient Gender: M                  HR:           59 bpm. Exam Location:  [facility] Procedure: 2D Echo, Cardiac Doppler, Color Doppler and Saline Contrast Bubble            Study (Both Spectral and Color Flow Doppler were utilized during            procedure). Indications:     Stroke 434.91 / I63.9  History:         Patient has no prior history of Echocardiogram examinations.                  Risk Factors:Hypertension and Diabetes.  Sonographer:     Christopher Furnace Referring Phys:  [id number] JAN A MANSY Diagnosing Phys: Keller Alluri IMPRESSIONS  1. Left ventricular ejection fraction, by estimation, is 55 to 60%. The left ventricle has normal function. The left ventricle has no regional wall motion abnormalities. Left ventricular diastolic parameters were normal.  2. Right ventricular systolic function was not well visualized. The right ventricular size is not well visualized.  3. The mitral valve is normal in structure. No evidence of mitral valve regurgitation.  4. The aortic valve is tricuspid. Aortic valve regurgitation is not visualized.  5. Agitated saline contrast bubble study was negative, with no evidence of any interatrial shunt. FINDINGS  Left Ventricle: Left ventricular ejection fraction, by estimation, is 55 to 60%. The left ventricle has normal function. The left ventricle has no regional wall motion abnormalities. The left ventricular internal cavity size was normal in size. There is  no left ventricular hypertrophy. Left ventricular diastolic parameters were normal. Right Ventricle: The right ventricular size is not well visualized. Right vetricular wall thickness was not well visualized. Right ventricular systolic function was not well visualized. Left Atrium: Left atrial size was normal in size. Right Atrium: Right atrial size was normal in size. Pericardium: There is no evidence of pericardial effusion. Mitral Valve: The mitral valve is normal in structure. No evidence of mitral  valve regurgitation. MV peak gradient, 2.0 mmHg. The mean mitral valve gradient is 1.0 mmHg. Tricuspid Valve: The tricuspid valve is normal in structure. Tricuspid valve regurgitation is not demonstrated. Aortic Valve: The aortic valve is tricuspid. Aortic valve regurgitation is not visualized. Aortic valve mean gradient measures 1.5 mmHg. Aortic valve peak gradient measures 2.8 mmHg. Aortic valve area, by VTI measures 3.04 cm. Pulmonic Valve: The pulmonic valve was not well visualized. Pulmonic valve regurgitation is not visualized. Aorta: The aortic root is normal in size and structure. Venous: The inferior vena cava was not well visualized. IAS/Shunts: The atrial septum is grossly normal. Agitated saline contrast was given intravenously to evaluate for intracardiac shunting. Agitated saline contrast bubble study was negative, with no evidence of any interatrial shunt.  LEFT VENTRICLE PLAX 2D LVIDd:         5.34 cm   Diastology LVIDs:         3.31 cm   LV e' medial:    8.03 cm/s LV PW:         1.38 cm   LV E/e' medial:  12.6 LV IVS:        1.00 cm   LV e' lateral:   11.00 cm/s LVOT diam:     2.10 cm   LV E/e' lateral: 9.2 LV SV:         54 LV SV Index:   26 LVOT Area:     3.46 cm  RIGHT VENTRICLE RV Basal diam:  3.04 cm RV Mid diam:    3.06 cm RV S prime:     9.77 cm/s LEFT ATRIUM             Index        RIGHT ATRIUM          Index LA diam:        3.30 cm 1.58 cm/m   RA Area:     9.01 cm LA Vol (A2C):   73.8 ml 35.42 ml/m  RA Volume:   17.60 ml 8.45 ml/m LA Vol (A4C):   33.9 ml 16.27 ml/m LA Biplane Vol: 50.3 ml 24.14 ml/m  AORTIC VALVE AV Area (Vmax):    2.81 cm AV  Area (Vmean):   2.97 cm AV Area (VTI):     3.04 cm AV Vmax:           83.90 cm/s AV Vmean:          59.750 cm/s AV VTI:            0.178 m AV Peak Grad:      2.8 mmHg AV Mean Grad:      1.5 mmHg LVOT Vmax:         68.00 cm/s LVOT Vmean:        51.200 cm/s LVOT VTI:          0.156 m LVOT/AV VTI ratio: 0.88  AORTA Ao Root diam: 2.90 cm MITRAL  VALVE                TRICUSPID VALVE MV Area (PHT): 2.64 cm     TR Peak grad:   4.4 mmHg MV Area VTI:   2.63 cm     TR Vmax:        105.00 cm/s MV Peak grad:  2.0 mmHg MV Mean grad:  1.0 mmHg     SHUNTS MV Vmax:       0.70 m/s     Systemic VTI:  0.16 m MV Vmean:      43.9 cm/s    Systemic Diam: 2.10 cm MV Decel Time: 287 msec MV E velocity: 101.00 cm/s MV A velocity: 61.60 cm/s MV E/A ratio:  1.64 Keller Paterson Electronically signed by Keller Paterson Signature Date/Time: 10/27/2023/12:09:23 PM    Final    MR BRAIN WO CONTRAST Result Date: 10/26/2023 CLINICAL DATA:  Stroke, follow up EXAM: MRI HEAD WITHOUT CONTRAST TECHNIQUE: Multiplanar, multiecho pulse sequences of the brain and surrounding structures were obtained without intravenous contrast. COMPARISON:  Same day CT head. FINDINGS: Brain: Small acute infarct in the posterior limb of the left internal capsule. No substantial edema or mass effect. No evidence of acute hemorrhage, mass lesion, midline shift or hydrocephalus. Vascular: Major arterial flow voids are maintained at the skull base. Skull and upper cervical spine: Normal marrow signal. Sinuses/Orbits: Negative. Other: No mastoid effusions. IMPRESSION: Small acute infarct in the posterior limb of the left internal capsule. Electronically Signed   By: Gilmore GORMAN Molt M.D.   On: 10/26/2023 20:12   CT ANGIO HEAD NECK W WO CM (CODE STROKE) Result Date: 10/26/2023 CLINICAL DATA:  Provided history: Neuro deficit, acute, stroke suspected. Additional history obtained from electronic record: Dizziness, sensory changes in right arm and right side of face. EXAM: CT ANGIOGRAPHY HEAD AND NECK WITH AND WITHOUT CONTRAST TECHNIQUE: Multidetector CT imaging of the head and neck was performed using the standard protocol during bolus administration of intravenous contrast. Multiplanar CT image reconstructions and MIPs were obtained to evaluate the vascular anatomy. Carotid stenosis measurements (when applicable)  are obtained utilizing NASCET criteria, using the distal internal carotid diameter as the denominator. RADIATION DOSE REDUCTION: This exam was performed according to the departmental dose-optimization program which includes automated exposure control, adjustment of the mA and/or kV according to patient size and/or use of iterative reconstruction technique. CONTRAST:  75mL OMNIPAQUE  IOHEXOL  350 MG/ML SOLN COMPARISON:  Noncontrast head CT performed earlier today 10/26/2023. CT angiogram neck 01/03/2017. FINDINGS: CTA NECK FINDINGS Aortic arch: Standard aortic branching. Visible portions of the thoracic aorta upper normal in caliber. Streak/beam hardening artifact arising from a dense contrast bolus partially obscures the right subclavian artery. Within this limitation, there is no appreciable hemodynamically significant innominate or  proximal subclavian artery stenosis. Right carotid system: CCA and ICA patent within the neck without stenosis or significant). Left carotid system: CCA and ICA patent within the neck without stenosis or significant atherosclerotic disease. Tortuosity of the cervical ICA. Vertebral arteries: Codominant and patent within the neck without stenosis or significant atherosclerotic disease. Skeleton: Nonspecific reversal of the expected cervical doses. No acute fracture or aggressive osseous lesion. Other neck: No neck mass or cervical lymphadenopathy. Upper chest: No consolidation within the imaged lung apices. Review of the MIP images confirms the above findings CTA HEAD FINDINGS Anterior circulation: The intracranial internal carotid arteries are patent. The M1 middle cerebral arteries are patent. No M2 proximal branch occlusion or high-grade proximal stenosis. The anterior cerebral arteries are patent. Strongly dominant left anterior cerebral artery beginning at the A2 segment level, likely contributing supply to both the left and right cerebral hemispheres. No intracranial aneurysm is  identified. Posterior circulation: The intracranial vertebral arteries are patent. The basilar artery is patent. Fenestration within the proximal basilar artery. The posterior cerebral arteries are patent. Moderate-to-severe stenosis within a right PCA branch at the P3 segment level (series 11, image 34). Posterior communicating arteries are diminutive or absent, bilaterally. Venous sinuses: Within the limitations of contrast timing, no convincing thrombus. Anatomic variants: As described. Review of the MIP images confirms the above findings No emergent large vessel occlusion identified. These results were communicated to Dr. Michaela at 7:42 pmon 8/24/2025by text page via the [facility] messaging system. IMPRESSION: CTA neck: The common carotid, internal carotid and vertebral arteries are patent within the neck without stenosis or significant atherosclerotic disease. CTA head: 1. No proximal intracranial large vessel occlusion identified. 2. Moderate-to-severe stenosis within a right posterior cerebral artery branch at the P3 segment level. 3. Fenestration within the proximal basilar artery. Electronically Signed   By: Rockey Childs D.O.   On: 10/26/2023 19:43   CT HEAD CODE STROKE WO CONTRAST Result Date: 10/26/2023 CLINICAL DATA:  Code stroke. Neuro deficit, acute, stroke suspected. EXAM: CT HEAD WITHOUT CONTRAST TECHNIQUE: Contiguous axial images were obtained from the base of the skull through the vertex without intravenous contrast. RADIATION DOSE REDUCTION: This exam was performed according to the departmental dose-optimization program which includes automated exposure control, adjustment of the mA and/or kV according to patient size and/or use of iterative reconstruction technique. COMPARISON:  Head CT 01/04/2017. FINDINGS: Brain: No age-advanced or lobar predominant cerebral atrophy. There is no acute intracranial hemorrhage. No acute demarcated cortical infarct. No extra-axial fluid collection. No evidence  of an intracranial mass. No midline shift. Vascular: No hyperdense vessel. Skull: No calvarial fracture or aggressive osseous lesion. Sinuses/Orbits: No mass or acute finding within the imaged orbits. Small-volume secretions within the bilateral sphenoid sinuses. ASPECTS [facility] Stroke Program Early CT Score) - Ganglionic level infarction (caudate, lentiform nuclei, internal capsule, insula, M1-M3 cortex): 7 - Supraganglionic infarction (M4-M6 cortex): 3 Total score (0-10 with 10 being normal): 10 No evidence of an acute intracranial abnormality. These results were communicated to Dr. Voncile at 7:04 pmon 8/24/2025by text page via the [facility] messaging system. IMPRESSION: 1.  No evidence of an acute intracranial abnormality. 2. Mild bilateral sphenoid sinus disease. Electronically Signed   By: Rockey Childs D.O.   On: 10/26/2023 19:05    Microbiology: Results for orders placed or performed in visit on 03/14/22  Gonococcus culture     Status: None   Collection Time: 03/14/22  4:24 PM   Specimen: Pharynx; Throat   PH  Result Value Ref  Range Status   GC Culture Only Final report  Final    Comment: Specimen stability note: A swab transport (ie., ESwab, Amies agar gel) received by the lab more than 24 hours after collection may result in reduced recovery of Neisseria gonorrhoeae (GC). (This is informational only and may not apply to this specimen.)    Result 1 Comment  Final    Comment: No Neisseria gonorrhoeae isolated.  Chlamydia/GC NAA, Confirmation     Status: None   Collection Time: 03/14/22  4:26 PM   Specimen: Urine   UR  Result Value Ref Range Status   Chlamydia trachomatis, NAA Negative Negative Final   Neisseria gonorrhoeae, NAA Negative Negative Final    Labs: CBC: Recent Labs  Lab 10/26/23 1850 10/28/23 0522  WBC 6.0 5.8  NEUTROABS 2.6  --   HGB 12.8* 12.8*  HCT 39.0 38.3*  MCV 91.3 90.3  PLT 189 172   Basic Metabolic Panel: Recent Labs  Lab 10/26/23 1850 10/28/23 0522   NA 140 140  K 4.0 4.2  CL 107 106  CO2 25 27  GLUCOSE 171* 197*  BUN 13 15  CREATININE 0.62 0.82  CALCIUM  9.4 9.1   Liver Function Tests: Recent Labs  Lab 10/26/23 1850  AST 24  ALT 28  ALKPHOS 118  BILITOT 0.7  PROT 7.1  ALBUMIN 4.1   CBG: Recent Labs  Lab 10/27/23 1148 10/27/23 1609 10/27/23 2040 10/28/23 0816 10/28/23 1219  GLUCAP 185* 137* 253* 177* 133*    Discharge time spent: greater than 30 minutes.  Signed: Laree Lock, MD [facility] 10/28/2023
